# Patient Record
Sex: Male | Born: 1980
Health system: Southern US, Community
[De-identification: ages and names within clinical notes are randomized; demographics above are authoritative.]

## PROBLEM LIST (undated history)

## (undated) DIAGNOSIS — I10 Essential (primary) hypertension: Secondary | ICD-10-CM

## (undated) DIAGNOSIS — S069X9A Unspecified intracranial injury with loss of consciousness of unspecified duration, initial encounter: Secondary | ICD-10-CM

## (undated) DIAGNOSIS — Z91018 Allergy to other foods: Secondary | ICD-10-CM

## (undated) DIAGNOSIS — G43909 Migraine, unspecified, not intractable, without status migrainosus: Secondary | ICD-10-CM

## (undated) DIAGNOSIS — G44309 Post-traumatic headache, unspecified, not intractable: Secondary | ICD-10-CM

## (undated) DIAGNOSIS — B019 Varicella without complication: Secondary | ICD-10-CM

## (undated) DIAGNOSIS — G4730 Sleep apnea, unspecified: Secondary | ICD-10-CM

## (undated) DIAGNOSIS — G5600 Carpal tunnel syndrome, unspecified upper limb: Secondary | ICD-10-CM

## (undated) DIAGNOSIS — Z87442 Personal history of urinary calculi: Secondary | ICD-10-CM

## (undated) DIAGNOSIS — G4489 Other headache syndrome: Secondary | ICD-10-CM

## (undated) DIAGNOSIS — M109 Gout, unspecified: Secondary | ICD-10-CM

## (undated) DIAGNOSIS — M1990 Unspecified osteoarthritis, unspecified site: Secondary | ICD-10-CM

## (undated) DIAGNOSIS — S0990XS Unspecified injury of head, sequela: Secondary | ICD-10-CM

## (undated) HISTORY — DX: Gout, unspecified: M10.9

## (undated) HISTORY — DX: Post-traumatic headache, unspecified, not intractable: S09.90XS

## (undated) HISTORY — PX: CERVICAL FUSION: SHX112

## (undated) HISTORY — DX: Other headache syndrome: G44.89

## (undated) HISTORY — DX: Carpal tunnel syndrome, unspecified upper limb: G56.00

## (undated) HISTORY — DX: Varicella without complication: B01.9

## (undated) HISTORY — DX: Unspecified osteoarthritis, unspecified site: M19.90

## (undated) HISTORY — DX: Allergy to other foods: Z91.018

## (undated) HISTORY — DX: Post-traumatic headache, unspecified, not intractable: G44.309

---
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
## 2006-03-26 ENCOUNTER — Ambulatory Visit: Payer: Self-pay | Admitting: Internal Medicine

## 2006-04-04 ENCOUNTER — Encounter: Admission: RE | Admit: 2006-04-04 | Discharge: 2006-04-04 | Payer: Self-pay | Admitting: Internal Medicine

## 2009-02-11 DIAGNOSIS — S069XAA Unspecified intracranial injury with loss of consciousness status unknown, initial encounter: Secondary | ICD-10-CM

## 2009-02-11 DIAGNOSIS — S069X9A Unspecified intracranial injury with loss of consciousness of unspecified duration, initial encounter: Secondary | ICD-10-CM

## 2009-02-11 HISTORY — DX: Unspecified intracranial injury with loss of consciousness status unknown, initial encounter: S06.9XAA

## 2009-02-11 HISTORY — DX: Unspecified intracranial injury with loss of consciousness of unspecified duration, initial encounter: S06.9X9A

## 2009-12-19 ENCOUNTER — Observation Stay (HOSPITAL_COMMUNITY): Admission: RE | Admit: 2009-12-19 | Discharge: 2009-12-20 | Payer: Self-pay | Admitting: Orthopedic Surgery

## 2010-02-11 HISTORY — PX: FRACTURE SURGERY: SHX138

## 2010-02-11 HISTORY — PX: OTHER SURGICAL HISTORY: SHX169

## 2010-02-15 ENCOUNTER — Encounter
Admission: RE | Admit: 2010-02-15 | Discharge: 2010-03-13 | Payer: Self-pay | Source: Home / Self Care | Attending: Neurosurgery | Admitting: Neurosurgery

## 2010-03-14 ENCOUNTER — Ambulatory Visit: Payer: Worker's Compensation | Admitting: Rehabilitative and Restorative Service Providers"

## 2010-03-14 ENCOUNTER — Encounter: Payer: Self-pay | Admitting: Occupational Therapy

## 2010-03-14 ENCOUNTER — Encounter: Payer: Worker's Compensation | Admitting: Occupational Therapy

## 2010-03-14 ENCOUNTER — Ambulatory Visit: Payer: Self-pay | Admitting: Rehabilitative and Restorative Service Providers"

## 2010-03-14 DIAGNOSIS — IMO0001 Reserved for inherently not codable concepts without codable children: Secondary | ICD-10-CM | POA: Insufficient documentation

## 2010-03-14 DIAGNOSIS — M25619 Stiffness of unspecified shoulder, not elsewhere classified: Secondary | ICD-10-CM | POA: Insufficient documentation

## 2010-03-14 DIAGNOSIS — R4189 Other symptoms and signs involving cognitive functions and awareness: Secondary | ICD-10-CM | POA: Insufficient documentation

## 2010-03-19 ENCOUNTER — Ambulatory Visit: Payer: Worker's Compensation | Attending: Neurosurgery | Admitting: Occupational Therapy

## 2010-03-19 DIAGNOSIS — R4189 Other symptoms and signs involving cognitive functions and awareness: Secondary | ICD-10-CM | POA: Insufficient documentation

## 2010-03-19 DIAGNOSIS — IMO0001 Reserved for inherently not codable concepts without codable children: Secondary | ICD-10-CM | POA: Insufficient documentation

## 2010-03-19 DIAGNOSIS — M25619 Stiffness of unspecified shoulder, not elsewhere classified: Secondary | ICD-10-CM | POA: Insufficient documentation

## 2010-03-21 ENCOUNTER — Ambulatory Visit: Payer: Worker's Compensation | Admitting: Occupational Therapy

## 2010-03-26 ENCOUNTER — Ambulatory Visit: Payer: Worker's Compensation | Admitting: Occupational Therapy

## 2010-03-28 ENCOUNTER — Ambulatory Visit: Payer: Worker's Compensation | Admitting: Occupational Therapy

## 2010-04-02 ENCOUNTER — Ambulatory Visit: Payer: Worker's Compensation | Admitting: Occupational Therapy

## 2010-04-04 ENCOUNTER — Encounter: Payer: Worker's Compensation | Admitting: Occupational Therapy

## 2010-04-04 ENCOUNTER — Ambulatory Visit: Payer: Worker's Compensation | Admitting: Occupational Therapy

## 2010-04-09 ENCOUNTER — Ambulatory Visit: Payer: Worker's Compensation | Admitting: Occupational Therapy

## 2010-04-11 ENCOUNTER — Ambulatory Visit: Payer: Worker's Compensation | Admitting: Occupational Therapy

## 2010-04-17 ENCOUNTER — Encounter: Payer: Worker's Compensation | Admitting: Occupational Therapy

## 2010-04-17 ENCOUNTER — Ambulatory Visit: Payer: Worker's Compensation | Attending: Neurosurgery | Admitting: Occupational Therapy

## 2010-04-17 DIAGNOSIS — IMO0001 Reserved for inherently not codable concepts without codable children: Secondary | ICD-10-CM | POA: Insufficient documentation

## 2010-04-17 DIAGNOSIS — M25619 Stiffness of unspecified shoulder, not elsewhere classified: Secondary | ICD-10-CM | POA: Insufficient documentation

## 2010-04-17 DIAGNOSIS — R4189 Other symptoms and signs involving cognitive functions and awareness: Secondary | ICD-10-CM | POA: Insufficient documentation

## 2010-04-18 ENCOUNTER — Ambulatory Visit: Payer: Worker's Compensation | Admitting: Occupational Therapy

## 2010-04-19 ENCOUNTER — Encounter: Payer: Worker's Compensation | Admitting: Occupational Therapy

## 2010-04-24 LAB — BASIC METABOLIC PANEL
Chloride: 104 mEq/L (ref 96–112)
Creatinine, Ser: 0.94 mg/dL (ref 0.4–1.5)
GFR calc Af Amer: >60 mL/min (ref >60)
GFR calc non Af Amer: >60 mL/min (ref >60)
Potassium: 3.9 mEq/L (ref 3.5–5.1)

## 2010-04-24 LAB — CBC
MCV: 91.3 fL (ref 78.0–100.0)
Platelets: 225 10*3/uL (ref 150–400)
RBC: 5.31 MIL/uL (ref 4.22–5.81)
WBC: 7.1 10*3/uL (ref 4.0–10.5)

## 2010-04-24 LAB — SURGICAL PCR SCREEN
MRSA, PCR: NEGATIVE
Staphylococcus aureus: POSITIVE — AB

## 2010-04-24 LAB — DIFFERENTIAL
Eosinophils Relative: 2 % (ref 0–5)
Lymphocytes Relative: 24 % (ref 12–46)
Lymphs Abs: 1.7 10*3/uL (ref 0.7–4.0)
Neutro Abs: 4.8 10*3/uL (ref 1.7–7.7)

## 2010-04-24 LAB — URINALYSIS, ROUTINE W REFLEX MICROSCOPIC
Bilirubin Urine: NEGATIVE
Hgb urine dipstick: NEGATIVE
Ketones, ur: NEGATIVE mg/dL
Nitrite: NEGATIVE
Protein, ur: NEGATIVE mg/dL
Specific Gravity, Urine: 1.026 (ref 1.005–1.030)
Urobilinogen, UA: 0.2 mg/dL (ref 0.0–1.0)

## 2010-04-24 LAB — APTT: aPTT: 28 seconds (ref 24–37)

## 2010-04-24 LAB — PROTIME-INR
INR: 0.89 (ref 0.00–1.49)
Prothrombin Time: 12.2 seconds (ref 11.6–15.2)

## 2010-04-26 DIAGNOSIS — F0781 Postconcussional syndrome: Secondary | ICD-10-CM

## 2010-05-03 ENCOUNTER — Emergency Department (INDEPENDENT_AMBULATORY_CARE_PROVIDER_SITE_OTHER): Payer: Worker's Compensation

## 2010-05-03 ENCOUNTER — Emergency Department (HOSPITAL_BASED_OUTPATIENT_CLINIC_OR_DEPARTMENT_OTHER)
Admission: EM | Admit: 2010-05-03 | Discharge: 2010-05-03 | Disposition: A | Discharge status: Home / Self Care | Payer: Worker's Compensation | Attending: Emergency Medicine | Admitting: Emergency Medicine

## 2010-05-03 DIAGNOSIS — N201 Calculus of ureter: Secondary | ICD-10-CM

## 2010-05-03 DIAGNOSIS — N1330 Unspecified hydronephrosis: Secondary | ICD-10-CM | POA: Insufficient documentation

## 2010-05-03 DIAGNOSIS — N200 Calculus of kidney: Secondary | ICD-10-CM

## 2010-05-03 DIAGNOSIS — R1032 Left lower quadrant pain: Secondary | ICD-10-CM | POA: Insufficient documentation

## 2010-05-09 DIAGNOSIS — F0781 Postconcussional syndrome: Secondary | ICD-10-CM

## 2010-05-28 ENCOUNTER — Ambulatory Visit (HOSPITAL_COMMUNITY)
Admission: RE | Admit: 2010-05-28 | Discharge: 2010-05-28 | Disposition: A | Discharge status: Home / Self Care | Payer: 59 | Source: Ambulatory Visit | Attending: Urology | Admitting: Urology

## 2010-05-28 DIAGNOSIS — N201 Calculus of ureter: Secondary | ICD-10-CM | POA: Insufficient documentation

## 2010-05-28 DIAGNOSIS — I10 Essential (primary) hypertension: Secondary | ICD-10-CM | POA: Insufficient documentation

## 2011-02-19 ENCOUNTER — Other Ambulatory Visit (HOSPITAL_COMMUNITY): Payer: Self-pay

## 2011-03-05 DIAGNOSIS — F0781 Postconcussional syndrome: Secondary | ICD-10-CM

## 2011-03-11 DIAGNOSIS — F0781 Postconcussional syndrome: Secondary | ICD-10-CM

## 2012-01-02 ENCOUNTER — Other Ambulatory Visit (HOSPITAL_COMMUNITY): Payer: Self-pay | Admitting: *Deleted

## 2012-01-03 ENCOUNTER — Other Ambulatory Visit (HOSPITAL_COMMUNITY): Payer: Self-pay

## 2012-01-03 ENCOUNTER — Other Ambulatory Visit (HOSPITAL_COMMUNITY): Payer: Self-pay | Admitting: *Deleted

## 2012-01-03 DIAGNOSIS — Z139 Encounter for screening, unspecified: Secondary | ICD-10-CM

## 2012-01-15 ENCOUNTER — Ambulatory Visit (HOSPITAL_COMMUNITY)
Admission: RE | Admit: 2012-01-15 | Discharge: 2012-01-15 | Disposition: A | Discharge status: Home / Self Care | Payer: Worker's Compensation | Source: Ambulatory Visit | Attending: Neurology | Admitting: Neurology

## 2012-01-15 DIAGNOSIS — Z139 Encounter for screening, unspecified: Secondary | ICD-10-CM | POA: Insufficient documentation

## 2012-02-19 ENCOUNTER — Inpatient Hospital Stay (HOSPITAL_COMMUNITY): Admission: RE | Admit: 2012-02-19 | Payer: Self-pay | Source: Ambulatory Visit

## 2012-04-12 ENCOUNTER — Emergency Department (HOSPITAL_BASED_OUTPATIENT_CLINIC_OR_DEPARTMENT_OTHER)
Admission: EM | Admit: 2012-04-12 | Discharge: 2012-04-12 | Disposition: A | Discharge status: Home / Self Care | Payer: 59 | Attending: Emergency Medicine | Admitting: Emergency Medicine

## 2012-04-12 ENCOUNTER — Encounter (HOSPITAL_BASED_OUTPATIENT_CLINIC_OR_DEPARTMENT_OTHER): Payer: Self-pay | Admitting: *Deleted

## 2012-04-12 DIAGNOSIS — H5710 Ocular pain, unspecified eye: Secondary | ICD-10-CM | POA: Insufficient documentation

## 2012-04-12 DIAGNOSIS — Z79899 Other long term (current) drug therapy: Secondary | ICD-10-CM | POA: Insufficient documentation

## 2012-04-12 DIAGNOSIS — Z87828 Personal history of other (healed) physical injury and trauma: Secondary | ICD-10-CM | POA: Insufficient documentation

## 2012-04-12 DIAGNOSIS — Z87820 Personal history of traumatic brain injury: Secondary | ICD-10-CM | POA: Insufficient documentation

## 2012-04-12 DIAGNOSIS — G43909 Migraine, unspecified, not intractable, without status migrainosus: Secondary | ICD-10-CM | POA: Insufficient documentation

## 2012-04-12 DIAGNOSIS — H5711 Ocular pain, right eye: Secondary | ICD-10-CM

## 2012-04-12 HISTORY — DX: Unspecified intracranial injury with loss of consciousness of unspecified duration, initial encounter: S06.9X9A

## 2012-04-12 HISTORY — DX: Migraine, unspecified, not intractable, without status migrainosus: G43.909

## 2012-04-12 MED ORDER — FLUORESCEIN SODIUM 1 MG OP STRP
1.0000 | ORAL_STRIP | Freq: Once | OPHTHALMIC | Status: AC
  Administered 2012-04-12: 1 via OPHTHALMIC
  Filled 2012-04-12: qty 1

## 2012-04-12 MED ORDER — TETRACAINE HCL 0.5 % OP SOLN
2.0000 [drp] | Freq: Once | OPHTHALMIC | Status: AC
  Administered 2012-04-12: 2 [drp] via OPHTHALMIC
  Filled 2012-04-12: qty 2

## 2012-04-12 NOTE — ED Notes (Signed)
Visual acuity completed with corrective lens in use.
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
## 2012-04-12 NOTE — ED Notes (Signed)
MD at bedside. 
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
## 2012-07-18 DIAGNOSIS — M25579 Pain in unspecified ankle and joints of unspecified foot: Secondary | ICD-10-CM | POA: Insufficient documentation

## 2012-07-18 DIAGNOSIS — I959 Hypotension, unspecified: Secondary | ICD-10-CM | POA: Insufficient documentation

## 2012-07-18 DIAGNOSIS — S069X9A Unspecified intracranial injury with loss of consciousness of unspecified duration, initial encounter: Secondary | ICD-10-CM | POA: Insufficient documentation

## 2012-07-18 DIAGNOSIS — M79609 Pain in unspecified limb: Secondary | ICD-10-CM | POA: Insufficient documentation

## 2013-04-26 DIAGNOSIS — L089 Local infection of the skin and subcutaneous tissue, unspecified: Secondary | ICD-10-CM | POA: Insufficient documentation

## 2014-01-12 ENCOUNTER — Ambulatory Visit (INDEPENDENT_AMBULATORY_CARE_PROVIDER_SITE_OTHER): Payer: 59 | Admitting: Family

## 2014-01-12 ENCOUNTER — Encounter: Payer: Self-pay | Admitting: Family

## 2014-01-12 ENCOUNTER — Other Ambulatory Visit (INDEPENDENT_AMBULATORY_CARE_PROVIDER_SITE_OTHER): Payer: 59

## 2014-01-12 VITALS — BP 138/88 | HR 81 | Temp 98.4°F | Resp 18 | Ht 72.0 in | Wt 243.0 lb

## 2014-01-12 DIAGNOSIS — R198 Other specified symptoms and signs involving the digestive system and abdomen: Secondary | ICD-10-CM

## 2014-01-12 DIAGNOSIS — R194 Change in bowel habit: Secondary | ICD-10-CM

## 2014-01-12 DIAGNOSIS — R0789 Other chest pain: Secondary | ICD-10-CM | POA: Insufficient documentation

## 2014-01-12 DIAGNOSIS — R5383 Other fatigue: Secondary | ICD-10-CM | POA: Insufficient documentation

## 2014-01-12 LAB — CBC
HEMATOCRIT: 47.9 % (ref 39.0–52.0)
HEMOGLOBIN: 16.2 g/dL (ref 13.0–17.0)
MCHC: 33.8 g/dL (ref 30.0–36.0)
MCV: 92.9 fl (ref 78.0–100.0)
PLATELETS: 275 10*3/uL (ref 150.0–400.0)
RBC: 5.16 Mil/uL (ref 4.22–5.81)
RDW: 12.1 % (ref 11.5–15.5)
WBC: 9.3 10*3/uL (ref 4.0–10.5)

## 2014-01-12 LAB — LIPID PANEL
CHOLESTEROL: 197 mg/dL (ref 0–200)
HDL: 30.3 mg/dL — ABNORMAL LOW (ref >39.00)
LDL CALC: 134 mg/dL — AB (ref 0–99)
NonHDL: 166.7
Total CHOL/HDL Ratio: 7
Triglycerides: 164 mg/dL — ABNORMAL HIGH (ref 0.0–149.0)
VLDL: 32.8 mg/dL (ref 0.0–40.0)

## 2014-01-12 LAB — BASIC METABOLIC PANEL
BUN: 18 mg/dL (ref 6–23)
CHLORIDE: 103 meq/L (ref 96–112)
CO2: 23 mEq/L (ref 19–32)
CREATININE: 1 mg/dL (ref 0.4–1.5)
Calcium: 9.6 mg/dL (ref 8.4–10.5)
GFR: 90.13 mL/min (ref >60.00)
Glucose, Bld: 78 mg/dL (ref 70–99)
POTASSIUM: 4 meq/L (ref 3.5–5.1)
SODIUM: 137 meq/L (ref 135–145)

## 2014-01-12 MED ORDER — ALPRAZOLAM 0.25 MG PO TABS: 0.2500 mg | ORAL_TABLET | Freq: Two times a day (BID) | ORAL | Status: DC | PRN

## 2014-01-12 NOTE — Progress Notes (Signed)
Pre visit review using our clinic review tool, if applicable. No additional management support is needed unless otherwise documented below in the visit note. 

## 2014-01-12 NOTE — Assessment & Plan Note (Signed)
Symptoms and exam are benign. Start probiotic to determine if this will improve the problem. Follow up if symptoms worsen or fail to improve

## 2014-01-12 NOTE — Assessment & Plan Note (Signed)
Chest tightness is most likely related to anxiety. Obtain EKG. Pending results of EKG may refer to cardiology. Patient requesting medication to assist with anxiety. Discussed risks and benefits of medication. Patient would like as needed medication at this time. Start Xanax. Follow up if symptoms worsen or fail to improve.  EKG reveals normal sinus rhythm.

## 2014-01-12 NOTE — Progress Notes (Signed)
   Subjective:    Patient ID: Wayne Wise, male    DOB: 22-Mar-1980, 33 y.o.   MRN: [id number]  Chief Complaint  Patient presents with  . Establish Care    chest tightness, frequent BMs, no energy    HPI:  Wayne Wise is a 33 y.o. male who presents today to establish care and discuss     1) Chest tightness - Other night he could not sleep because he felt a squeezing on his chest, states "it wasn't sitting on my chest."  Denies any associated jaw or shoulder pain. Has occurred twice total. Got better on its own with no treatment. Potentially stress is an aggravating factor. Denies any chest pain/discomfort, shortness of breath, palpitations.   2) Decreased energy - has been tired a lot. Has been going on since about June maybe before. Around the the time that he has a child. 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 Currently averaging around 7 hours per sleep. Indicates he feels rested when he awakens. Started snoring after TBI.  3) Frequent bowel movements - describes every time after he eats. Normally was once a day. Eats everything with no changes in diet.   Allergies  Allergen Reactions  . Sulfa Antibiotics Swelling   Current Outpatient Prescriptions on File Prior to Visit  Medication Sig Dispense Refill  . HYDROmorphone (DILAUDID) 4 MG tablet Take 4 mg by mouth every 4 (four) hours as needed for pain.     No current facility-administered medications on file prior to visit.   Past Medical History  Diagnosis Date  . TBI (traumatic brain injury)   . Migraines   . Chicken pox   . 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 Kidney stones   . Arthritis     Review of Systems    See HPI  Objective:    BP 138/88 mmHg  Pulse 81  Temp(Src) 98.4 F (36.9 C) (Oral)  Resp 18  Ht 6' (1.829 m)  Wt 243 lb (110.224 kg)  BMI 32.95 kg/m2  SpO2 97%  Nursing note and vital signs reviewed.  Physical Exam  Constitutional: He is oriented to person, place, and time. He appears  well-developed and well-nourished. No distress.  Cardiovascular: Normal rate, regular rhythm, normal heart sounds and intact distal pulses.   Pulmonary/Chest: Effort normal and breath sounds normal.  Abdominal: Soft. Bowel sounds are normal. He exhibits no distension and no mass. There is no tenderness. There is no rebound and no guarding.  Neurological: He is alert and oriented to person, place, and time.  Skin: Skin is warm and dry.  Psychiatric: He has a normal mood and affect. His behavior is normal. Judgment and thought content normal.       Assessment & Plan:

## 2014-01-12 NOTE — Assessment & Plan Note (Signed)
Obtain TSH, basic metabolic panel, CBC, and lipid profile 2 rule out any metabolic causes of fatigue. Potentially related to working night shift. May also be related to stress patient is experiencing with family issues. Follow up pending lab work.

## 2014-01-12 NOTE — Patient Instructions (Signed)
Thank you for choosing [facility].  Summary/Instructions:  Your prescription(s) have been submitted to your pharmacy. Please take as directed and contact our office if you believe you are having problem(s) with the medication(s).  Please stop by the lab on the basement level of the building for your blood work. Your results will be released to [facility] (or called to you) after review, usually within 72hours after test completion. If any changes need to be made, you will be notified at that same time.  If your symptoms worsen or fail to improve, please contact our office for further instruction, or in case of emergency go directly to the emergency room at the closest medical facility.   Please schedule a time for your physical.   Generalized Anxiety Disorder Generalized anxiety disorder (GAD) is a mental disorder. It interferes with life functions, including relationships, work, and school. GAD is different from normal anxiety, which everyone experiences at some point in their lives in response to specific life events and activities. Normal anxiety actually helps [place] prepare for and get through these life events and activities. Normal anxiety goes away after the event or activity is over.  GAD causes anxiety that is not necessarily related to specific events or activities. It also causes excess anxiety in proportion to specific events or activities. The anxiety associated with GAD is also difficult to control. GAD can vary from mild to severe. People with severe GAD can have intense waves of anxiety with physical symptoms (panic attacks).  SYMPTOMS The anxiety and worry associated with GAD are difficult to control. This anxiety and worry are related to many life events and activities and also occur more days than not for 6 months or longer. People with GAD also have three or more of the following symptoms (one or more in children):  Restlessness.   Fatigue.  Difficulty concentrating.    Irritability.  Muscle tension.  Difficulty sleeping or unsatisfying sleep. DIAGNOSIS GAD is diagnosed through an assessment by your health care provider. Your health care provider will ask you questions aboutyour mood,physical symptoms, and events in your life. Your health care provider may ask you about your medical history and use of alcohol or drugs, including prescription medicines. Your health care provider may also do a physical exam and blood tests. Certain medical conditions and the use of certain substances can cause symptoms similar to those associated with GAD. Your health care provider may refer you to a mental health specialist for further evaluation. TREATMENT The following therapies are usually used to treat GAD:   Medication. Antidepressant medication usually is prescribed for long-term daily control. Antianxiety medicines may be added in severe cases, especially when panic attacks occur.   Talk therapy (psychotherapy). Certain types of talk therapy can be helpful in treating GAD by providing support, education, and guidance. A form of talk therapy called cognitive behavioral therapy can teach you healthy ways to think about and react to daily life events and activities.  Stress managementtechniques. These include yoga, meditation, and exercise and can be very helpful when they are practiced regularly. A mental health specialist can help determine which treatment is best for you. Some people see improvement with one therapy. However, other people require a combination of therapies. Document Released: 05/25/2012 Document Revised: 06/14/2013 Document Reviewed: 05/25/2012 [facility] Patient Information 2015 [place], [place]. This information is not intended to replace advice given to you by your health care provider. Make sure you discuss any questions you have with your health care provider.  Fatigue Fatigue is a feeling of tiredness, lack of energy, lack of motivation, or  feeling tired all the time. Having enough rest, good nutrition, and reducing stress will normally reduce fatigue. Consult your caregiver if it persists. The nature of your fatigue will help your caregiver to find out its cause. The treatment is based on the cause.  CAUSES  There are many causes for fatigue. Most of the time, fatigue can be traced to one or more of your habits or routines. Most causes fit into one or more of three general areas. They are: Lifestyle problems  Sleep disturbances.  Overwork.  Physical exertion.  Unhealthy habits.  Poor eating habits or eating disorders.  Alcohol and/or drug use .  Lack of proper nutrition (malnutrition). Psychological problems  Stress and/or anxiety problems.  Depression.  Grief.  Boredom. Medical Problems or Conditions  Anemia.  Pregnancy.  Thyroid gland problems.  Recovery from major surgery.  Continuous pain.  Emphysema or asthma that is not well controlled  Allergic conditions.  Diabetes.  Infections (such as mononucleosis).  Obesity.  Sleep disorders, such as sleep apnea.  Heart failure or other heart-related problems.  Cancer.  Kidney disease.  Liver disease.  Effects of certain medicines such as antihistamines, cough and cold remedies, prescription pain medicines, heart and blood pressure medicines, drugs used for treatment of cancer, and some antidepressants. SYMPTOMS  The symptoms of fatigue include:   Lack of energy.  Lack of drive (motivation).  Drowsiness.  Feeling of indifference to the surroundings. DIAGNOSIS  The details of how you feel help guide your caregiver in finding out what is causing the fatigue. You will be asked about your present and past health condition. It is important to review all medicines that you take, including prescription and non-prescription items. A thorough exam will be done. You will be questioned about your feelings, habits, and normal lifestyle. Your  caregiver may suggest blood tests, urine tests, or other tests to look for common medical causes of fatigue.  TREATMENT  Fatigue is treated by correcting the underlying cause. For example, if you have continuous pain or depression, treating these causes will improve how you feel. Similarly, adjusting the dose of certain medicines will help in reducing fatigue.  HOME CARE INSTRUCTIONS   Try to get the required amount of good sleep every night.  Eat a healthy and nutritious diet, and drink enough water throughout the day.  Practice ways of relaxing (including yoga or meditation).  Exercise regularly.  Make plans to change situations that cause stress. Act on those plans so that stresses decrease over time. Keep your work and personal routine reasonable.  Avoid street drugs and minimize use of alcohol.  Start taking a daily multivitamin after consulting your caregiver. SEEK MEDICAL CARE IF:   You have persistent tiredness, which cannot be accounted for.  You have fever.  You have unintentional weight loss.  You have headaches.  You have disturbed sleep throughout the night.  You are feeling sad.  You have constipation.  You have dry skin.  You have gained weight.  You are taking any new or different medicines that you suspect are causing fatigue.  You are unable to sleep at night.  You develop any unusual swelling of your legs or other parts of your body. SEEK IMMEDIATE MEDICAL CARE IF:   You are feeling confused.  Your vision is blurred.  You feel faint or pass out.  You develop severe headache.  You develop severe abdominal, pelvic,  or back pain.  You develop chest pain, shortness of breath, or an irregular or fast heartbeat.  You are unable to pass a normal amount of urine.  You develop abnormal bleeding such as bleeding from the rectum or you vomit blood.  You have thoughts about harming yourself or committing suicide.  You are worried that you might  harm someone else. MAKE SURE YOU:   Understand these instructions.  Will watch your condition.  Will get help right away if you are not doing well or get worse. Document Released: 11/25/2006 Document Revised: 04/22/2011 Document Reviewed: 06/01/2013 [facility] Patient Information 2015 [place], [place]. This information is not intended to replace advice given to you by your health care provider. Make sure you discuss any questions you have with your health care provider.

## 2014-01-13 ENCOUNTER — Telehealth: Payer: Self-pay | Admitting: Family

## 2014-01-13 LAB — TSH: TSH: 1.93 u[IU]/mL (ref 0.35–4.50)

## 2014-01-13 NOTE — Telephone Encounter (Signed)
Called pt and let him know all labs are normal with the exception of his cholesterol being a little elevated. He is aware to try to eat more fruits and veggies and decrease saturated fats.

## 2014-01-13 NOTE — Telephone Encounter (Signed)
Please call patient and tell him that his lab work is within the normal limits. His cholesterol is at the high end of the normal range. Recommend increasing fruits and vegetables and decreasing saturated fats. This should help to decrease his overall cholesterol levels. Otherwise no action is needed at this time.

## 2014-01-17 ENCOUNTER — Telehealth: Payer: Self-pay | Admitting: Family

## 2014-01-17 MED ORDER — DIAZEPAM 5 MG PO TABS: 5.0000 mg | ORAL_TABLET | Freq: Two times a day (BID) | ORAL | Status: DC | PRN

## 2014-01-17 NOTE — Telephone Encounter (Signed)
Called pt to let him know that rx for valium is ready for pick up

## 2014-01-17 NOTE — Telephone Encounter (Signed)
Please call patient and inform him we can start valium as needed for anxiety. If this does not work we will need to consider something taken on a daily basis.

## 2014-01-17 NOTE — Telephone Encounter (Signed)
Patient states xanax is not working for him.  Patient states he was suppose to take one pill every 12 hours and that didn't work.  He started taking two pills every 12 hours, that didn't work.  He has started taking 3 pills every 12 hours and states that is not working.

## 2014-01-25 ENCOUNTER — Ambulatory Visit (INDEPENDENT_AMBULATORY_CARE_PROVIDER_SITE_OTHER): Payer: 59 | Admitting: Family

## 2014-01-25 VITALS — BP 150/82 | HR 97 | Temp 98.2°F | Resp 18 | Ht 72.0 in | Wt 244.4 lb

## 2014-01-25 DIAGNOSIS — Z0000 Encounter for general adult medical examination without abnormal findings: Secondary | ICD-10-CM

## 2014-01-25 MED ORDER — CLONAZEPAM 1 MG PO TABS: 1.0000 mg | ORAL_TABLET | Freq: Two times a day (BID) | ORAL | Status: DC | PRN

## 2014-01-25 NOTE — Progress Notes (Signed)
Subjective:    Patient ID: Wayne Wise, male    DOB: [birth year], 33 y.o.   MRN: [id number]  Chief Complaint  Patient presents with  . CPE    Not fasting   HPI:  Wayne Wise is a 33 y.o. male who presents today for an annual wellness visit.  1) Health Maintenance - Rates the overall health as decent. Feels tired all the time and has always felt tired all the time.   Diet -  Anything and everything.  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   2) Preventative Exams / Immunizations:  Dental -- Up to date Vision -- Up to date  Health Maintenance  Topic Date Due  . INFLUENZA VACCINE  09/12/2014  . TETANUS/TDAP  11/18/2023    There is no immunization history on file for this patient.  Allergies  Allergen Reactions  . Sulfa Antibiotics Swelling   Current Outpatient Prescriptions on File Prior to Visit  Medication Sig Dispense Refill  . HYDROmorphone (DILAUDID) 4 MG tablet Take 4 mg by mouth every 4 (four) hours as needed for pain.     No current facility-administered medications on file prior to visit.   Past Medical History  Diagnosis Date  . TBI (traumatic brain injury)   . Migraines   . Chicken pox   . 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 Kidney stones   . Arthritis    Family History  Problem Relation Age of Onset  . Arthritis Mother   . Arthritis Maternal Grandmother   . Breast cancer Maternal Grandmother   . Diabetes Maternal Grandmother   . Stroke Maternal Grandfather   . Diabetes Maternal Grandfather   . Breast cancer Paternal Grandmother    History   Social History  . Marital Status: Married    Spouse Name: N/A    Number of Children: 1  . Years of Education: 12   Occupational History  . [employer]    Social History Main Topics  . Smoking status: Never Smoker   . Smokeless tobacco: Former Systems developer  . Alcohol Use: Yes     Comment: occasionally  . Drug Use: No  . Sexual Activity: Not on file   Other  Topics Concern  . Not on file   Social History Narrative   Born and raised in [place], [place]. Currently resides in a private residence wife and son. 1 cat (farm outside). Fun: Likes to hunt.    Denies religious beliefs that effect healthcare.     Review of Systems  Constitutional: Denies fever, chills, fatigue, or significant weight gain/loss. HENT: Head: Denies headache or neck pain Ears: Denies changes in hearing, ringing in ears, earache, drainage Nose: Denies discharge, stuffiness, itching, nosebleed, sinus pain Throat: Denies sore throat, hoarseness, dry mouth, sores, thrush Eyes: Denies loss/changes in vision, pain, redness, blurry/double vision, flashing lights Cardiovascular: Denies chest pain/discomfort, tightness, palpitations, shortness of breath with activity, difficulty lying down, swelling, sudden awakening with shortness of breath Respiratory: Denies shortness of breath, cough, sputum production, wheezing Gastrointestinal: Denies dysphasia, heartburn, change in appetite, nausea, change in bowel habits, rectal bleeding, constipation, diarrhea, yellow skin or eyes Genitourinary: Denies frequency, urgency, burning/pain, blood in urine, incontinence, change in urinary strength. Musculoskeletal: Denies muscle/joint pain, stiffness, back pain, redness or swelling of joints, trauma Skin: Denies rashes, lumps, itching, dryness, color changes, or hair/nail changes Neurological: Denies dizziness, fainting, seizures, weakness, numbness, tingling, tremor Psychiatric - Denies nervousness, stress,  depression or memory loss Does have anxiety on occasion.  Endocrine: Denies heat or cold intolerance, sweating, frequent urination, excessive thirst, changes in appetite Hematologic: Denies ease of bruising or bleeding    Objective:    BP 150/82 mmHg  Pulse 97  Temp(Src) 98.2 F (36.8 C) (Oral)  Resp 18  Ht 6' (1.829 m)  Wt 244 lb 6.4 oz (110.859 kg)  BMI 33.14 kg/m2  SpO2  97% Nursing note and vital signs reviewed.  Physical Exam  Constitutional: He is oriented to person, place, and time. He appears well-developed and well-nourished.  HENT:  Head: Normocephalic.  Right Ear: Hearing, tympanic membrane, external ear and ear canal normal.  Left Ear: Hearing, tympanic membrane, external ear and ear canal normal.  Nose: Nose normal.  Mouth/Throat: Uvula is midline, oropharynx is clear and moist and mucous membranes are normal.  Eyes: Conjunctivae and EOM are normal. Pupils are equal, round, and reactive to light.  Neck: Neck supple. No JVD present. No tracheal deviation present. No thyromegaly present.  Cardiovascular: Normal rate, regular rhythm, normal heart sounds and intact distal pulses.   Pulmonary/Chest: Effort normal and breath sounds normal.  Abdominal: Soft. Bowel sounds are normal. He exhibits no distension and no mass. There is no tenderness. There is no rebound and no guarding.  Musculoskeletal: Normal range of motion. He exhibits no edema or tenderness.  Lymphadenopathy:    He has no cervical adenopathy.  Neurological: He is alert and oriented to person, place, and time. He has normal reflexes. No cranial nerve deficit. He exhibits normal muscle tone. Coordination normal.  Skin: Skin is warm and dry.  Psychiatric: He has a normal mood and affect. His behavior is normal. Judgment and thought content normal.      Assessment & Plan:

## 2014-01-25 NOTE — Progress Notes (Signed)
Pre visit review using our clinic review tool, if applicable. No additional management support is needed unless otherwise documented below in the visit note. 

## 2014-01-25 NOTE — Patient Instructions (Signed)
Thank you for choosing [facility].  Summary/Instructions:  Your prescription(s) have been submitted to your pharmacy. Please take as directed and contact our office if you believe you are having problem(s) with the medication(s).  Health Maintenance A healthy lifestyle and preventative care can promote health and wellness.  Maintain regular health, dental, and eye exams.  Eat a healthy diet. Foods like vegetables, fruits, whole grains, low-fat dairy products, and lean protein foods contain the nutrients you need and are low in calories. Decrease your intake of foods high in solid fats, added sugars, and salt. Get information about a proper diet from your health care provider, if necessary.  Regular physical exercise is one of the most important things you can do for your health. Most adults should get at least 150 minutes of moderate-intensity exercise (any activity that increases your heart rate and causes you to sweat) each week. In addition, most adults need muscle-strengthening exercises on 2 or more days a week.   Maintain a healthy weight. The body mass index (BMI) is a screening tool to identify possible weight problems. It provides an estimate of body fat based on height and weight. Your health care provider can find your BMI and can help you achieve or maintain a healthy weight. For males 20 years and older:  A BMI below 18.5 is considered underweight.  A BMI of 18.5 to 24.9 is normal.  A BMI of 25 to 29.9 is considered overweight.  A BMI of 30 and above is considered obese.  Maintain normal blood lipids and cholesterol by exercising and minimizing your intake of saturated fat. Eat a balanced diet with plenty of fruits and vegetables. Blood tests for lipids and cholesterol should begin at age 40 and be repeated every 5 years. If your lipid or cholesterol levels are high, you are over age 93, or you are at high risk for heart disease, you may need your cholesterol levels  checked more frequently.Ongoing high lipid and cholesterol levels should be treated with medicines if diet and exercise are not working.  If you smoke, find out from your health care provider how to quit. If you do not use tobacco, do not start.  Lung cancer screening is recommended for adults aged 46-80 years who are at high risk for developing lung cancer because of a history of smoking. A yearly low-dose CT scan of the lungs is recommended for people who have at least a 30-pack-year history of smoking and are current smokers or have quit within the past 15 years. A pack year of smoking is smoking an average of 1 pack of cigarettes a day for 1 year (for example, a 30-pack-year history of smoking could mean smoking 1 pack a day for 30 years or 2 packs a day for 15 years). Yearly screening should continue until the smoker has stopped smoking for at least 15 years. Yearly screening should be stopped for people who develop a health problem that would prevent them from having lung cancer treatment.  If you choose to drink alcohol, do not have more than 2 drinks per day. One drink is considered to be 12 oz (360 mL) of beer, 5 oz (150 mL) of wine, or 1.5 oz (45 mL) of liquor.  Avoid the use of street drugs. Do not share needles with anyone. Ask for help if you need support or instructions about stopping the use of drugs.  High blood pressure causes heart disease and increases the risk of stroke. Blood pressure should be  checked at least every 1-2 years. Ongoing high blood pressure should be treated with medicines if weight loss and exercise are not effective.  If you are 57-8 years old, ask your health care provider if you should take aspirin to prevent heart disease.  Diabetes screening involves taking a blood sample to check your fasting blood sugar level. This should be done once every 3 years after age 24 if you are at a normal weight and without risk factors for diabetes. Testing should be considered  at a younger age or be carried out more frequently if you are overweight and have at least 1 risk factor for diabetes.  Colorectal cancer can be detected and often prevented. Most routine colorectal cancer screening begins at the age of 18 and continues through age 66. However, your health care provider may recommend screening at an earlier age if you have risk factors for colon cancer. On a yearly basis, your health care provider may provide home test kits to check for hidden blood in the stool. A small camera at the end of a tube may be used to directly examine the colon (sigmoidoscopy or colonoscopy) to detect the earliest forms of colorectal cancer. Talk to your health care provider about this at age 74 when routine screening begins. A direct exam of the colon should be repeated every 5-10 years through age 17, unless early forms of precancerous polyps or small growths are found.  People who are at an increased risk for hepatitis B should be screened for this virus. You are considered at high risk for hepatitis B if:  You were born in a country where hepatitis B occurs often. Talk with your health care provider about which countries are considered high risk.  Your parents were born in a high-risk country and you have not received a shot to protect against hepatitis B (hepatitis B vaccine).  You have HIV or AIDS.  You use needles to inject street drugs.  You live with, or have sex with, someone who has hepatitis B.  You are a man who has sex with other men (MSM).  You get hemodialysis treatment.  You take certain medicines for conditions like cancer, organ transplantation, and autoimmune conditions.  Hepatitis C blood testing is recommended for all people born from [birth year] through 1967 and any individual with known risk factors for hepatitis C.  Healthy men should no longer receive prostate-specific antigen (PSA) blood tests as part of routine cancer screening. Talk to your health care  provider about prostate cancer screening.  Testicular cancer screening is not recommended for adolescents or adult males who have no symptoms. Screening includes self-exam, a health care provider exam, and other screening tests. Consult with your health care provider about any symptoms you have or any concerns you have about testicular cancer.  Practice safe sex. Use condoms and avoid high-risk sexual practices to reduce the spread of sexually transmitted infections (STIs).  You should be screened for STIs, including gonorrhea and chlamydia if:  You are sexually active and are younger than 24 years.  You are older than 24 years, and your health care provider tells you that you are at risk for this type of infection.  Your sexual activity has changed since you were last screened, and you are at an increased risk for chlamydia or gonorrhea. Ask your health care provider if you are at risk.  If you are at risk of being infected with HIV, it is recommended that you take a prescription medicine  daily to prevent HIV infection. This is called pre-exposure prophylaxis (PrEP). You are considered at risk if:  You are a man who has sex with other men (MSM).  You are a heterosexual man who is sexually active with multiple partners.  You take drugs by injection.  You are sexually active with a partner who has HIV.  Talk with your health care provider about whether you are at high risk of being infected with HIV. If you choose to begin PrEP, you should first be tested for HIV. You should then be tested every 3 months for as long as you are taking PrEP.  Use sunscreen. Apply sunscreen liberally and repeatedly throughout the day. You should seek shade when your shadow is shorter than you. Protect yourself by wearing long sleeves, pants, a wide-brimmed hat, and sunglasses year round whenever you are outdoors.  Tell your health care provider of new moles or changes in moles, especially if there is a change  in shape or color. Also, tell your health care provider if a mole is larger than the size of a pencil eraser.  A one-time screening for abdominal aortic aneurysm (AAA) and surgical repair of large AAAs by ultrasound is recommended for men aged 79-75 years who are current or former smokers.  Stay current with your vaccines (immunizations). Document Released: 07/27/2007 Document Revised: 02/02/2013 Document Reviewed: 06/25/2010 [facility] Patient Information 2015 [place], [place]. This information is not intended to replace advice given to you by your health care provider. Make sure you discuss any questions you have with your health care provider.

## 2014-01-25 NOTE — Assessment & Plan Note (Signed)
1) Anticipatory Guidance: Discussed importance of wearing a seatbelt while driving and not texting while driving; changing batteries in smoke detector at least once annually; wearing suntan lotion when outside; eating a balanced and moderate diet; getting physical activity at least 30 minutes per day.  2) Immunizations / Screenings / Labs:  All recommended immunizations are up to date. All recommended screenings are up-to-date. Lab work was previously done and all within the normal limits.   Overall well exam. Continue current healthy lifestyle choices. Still continues to have family stress related to his wife's illness and maintaining family life. Start Klonopin as needed for anxiety. Follow up if symptoms worsen or fail to improve with Klonopin. Follow up prevention exam in one year.

## 2014-04-18 ENCOUNTER — Encounter: Payer: Self-pay | Admitting: Family

## 2014-04-18 ENCOUNTER — Ambulatory Visit (INDEPENDENT_AMBULATORY_CARE_PROVIDER_SITE_OTHER): Payer: 59 | Admitting: Family

## 2014-04-18 VITALS — BP 162/98 | HR 107 | Temp 98.3°F | Resp 18 | Ht 72.0 in | Wt 262.0 lb

## 2014-04-18 DIAGNOSIS — R0981 Nasal congestion: Secondary | ICD-10-CM

## 2014-04-18 DIAGNOSIS — J3089 Other allergic rhinitis: Secondary | ICD-10-CM | POA: Insufficient documentation

## 2014-04-18 DIAGNOSIS — M79642 Pain in left hand: Secondary | ICD-10-CM

## 2014-04-18 MED ORDER — AMOXICILLIN-POT CLAVULANATE 875-125 MG PO TABS: 1.0000 | ORAL_TABLET | Freq: Two times a day (BID) | ORAL | Status: DC

## 2014-04-18 NOTE — Progress Notes (Signed)
Pre visit review using our clinic review tool, if applicable. No additional management support is needed unless otherwise documented below in the visit note. 

## 2014-04-18 NOTE — Assessment & Plan Note (Signed)
Symptoms and exam consistent with sinusitis, however cannot rule out underlying allergic rhinitis. Start Augmentin. Recommend starting Allegra, Claritin, or Zyrtec. Continue over-the-counter medications as needed for symptom relief and supportive care. Follow up if symptoms worsen or fail to improve.

## 2014-04-18 NOTE — Assessment & Plan Note (Addendum)
Physical exam benign. Most likely related to muscle strain or unaccustomed activity. Start ibuprofen as needed for soreness. Obtain imaging if symptoms worsen or fail to improve.

## 2014-04-18 NOTE — Progress Notes (Signed)
   Subjective:    Patient ID: Wayne Wise, male    DOB: [birth year], 34 y.o.   MRN: [id number]  Chief Complaint  Patient presents with  . Nasal Congestion    congestion, sneezing, feels like he has fluid in ears, little cough, x4 days    HPI:  Wayne Wise is a 34 y.o. male who presents today for an acute visit.  1) Nasal congestion - This is a new problem. Associated symptoms of congestion, sneezing, cough and feels like fluid behind ears has been going on for about 4 days. Has not tried any OTC medications.   2) Left hand soreness - this is a new problem. Associated symptom of left hand soreness started about 2 days ago when he lifted a pig. The soreness of located on the posterior medial aspect of his left hand and is described mainly as soreness. Denies any sounds or sensations that were heard or felt. Denies any modifying factors.     Allergies  Allergen Reactions  . Sulfa Antibiotics Swelling    No current outpatient prescriptions on file prior to visit.   No current facility-administered medications on file prior to visit.   Past Medical History  Diagnosis Date  . TBI (traumatic brain injury)   . Migraines   . Chicken pox   . 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 Kidney stones   . Arthritis     Review of Systems  HENT: Positive for congestion, ear pain, sneezing and sore throat.   Respiratory: Positive for cough. Negative for chest tightness and shortness of breath.   Neurological: Positive for headaches.      Objective:    BP 162/98 mmHg  Pulse 107  Temp(Src) 98.3 F (36.8 C) (Oral)  Resp 18  Ht 6' (1.829 m)  Wt 262 lb (118.842 kg)  BMI 35.53 kg/m2  SpO2 98% Nursing note and vital signs reviewed.  Physical Exam  Constitutional: He is oriented to person, place, and time. He appears well-developed and well-nourished. No distress.  HENT:  Right Ear: Hearing, tympanic membrane, external ear and ear canal normal.  Left Ear: Hearing, tympanic membrane,  external ear and ear canal normal.  Nose: Right sinus exhibits maxillary sinus tenderness. Right sinus exhibits no frontal sinus tenderness. Left sinus exhibits maxillary sinus tenderness. Left sinus exhibits no frontal sinus tenderness.  Mouth/Throat: Uvula is midline, oropharynx is clear and moist and mucous membranes are normal.  Neck: Neck supple.  Cardiovascular: Normal rate, regular rhythm, normal heart sounds and intact distal pulses.   Pulmonary/Chest: Effort normal and breath sounds normal.  Musculoskeletal:  Left hand: No obvious deformity, discoloration, or edema of left hand noted. A little soreness of soft tissue around the fourth and fifth metacarpals noted. Grip strength is intact and appropriate. Finger abduction and abduction results in soreness.  Neurological: He is alert and oriented to person, place, and time.  Skin: Skin is warm and dry.  Psychiatric: He has a normal mood and affect. His behavior is normal. Judgment and thought content normal.       Assessment & Plan:

## 2014-04-18 NOTE — Patient Instructions (Signed)
Thank you for choosing [facility].  Summary/Instructions:  Your prescription(s) have been submitted to your pharmacy or been printed and provided for you. Please take as directed and contact our office if you believe you are having problem(s) with the medication(s) or have any questions.  If your symptoms worsen or fail to improve, please contact our office for further instruction, or in case of emergency go directly to the emergency room at the closest medical facility.   General Recommendations:    Please drink plenty of fluids.  Get plenty of rest   Sleep in humidified air  Use saline nasal sprays  Netti pot   OTC Medications:  Decongestants - helps relieve congestion   Flonase (generic fluticasone) or Nasacort (generic triamcinolone) - please make sure to use the "cross-over" technique at a 45 degree angle towards the opposite eye as opposed to straight up the nasal passageway.   If you have HIGH BLOOD PRESSURE - Coricidin HBP; AVOID any product that is -D as this contains pseudoephedrine which may increase your blood pressure.  Afrin (oxymetazoline) every 6-8 hours for up to 3 days.   Allergies - helps relieve runny nose, itchy eyes and sneezing   Claritin (generic loratidine), Allegra (fexofenidine), or Zyrtec (generic cyrterizine) for runny nose. These medications should not cause drowsiness.  Note - Benadryl (generic diphenhydramine) may be used however may cause drowsiness  Cough -   Delsym or Robitussin (generic dextromethorphan)  Expectorants - helps loosen mucus to ease removal   Mucinex (generic guaifenesin) as directed on the package.  Headaches / General Aches   Tylenol (generic acetaminophen) - DO NOT EXCEED 3 grams (3,000 mg) in a 24 hour time period  Advil/Motrin (generic ibuprofen)   Sore Throat -   Salt water gargle   Chloraseptic (generic benzocaine) spray or lozenges / Sucrets (generic dyclonine)      

## 2014-04-29 ENCOUNTER — Telehealth: Payer: Self-pay | Admitting: Family

## 2014-04-29 MED ORDER — ONDANSETRON HCL 4 MG PO TABS: 4.0000 mg | ORAL_TABLET | Freq: Three times a day (TID) | ORAL | Status: DC | PRN

## 2014-04-29 NOTE — Telephone Encounter (Signed)
Zofran sent to pharmacy

## 2014-04-29 NOTE — Telephone Encounter (Signed)
Pt called in said that the whole family has the Noro Virus and he as been throwing up since 3am this morning.  He wants to know if greg could call in something to help?

## 2014-05-02 NOTE — Telephone Encounter (Signed)
Pt aware.

## 2014-08-31 ENCOUNTER — Ambulatory Visit (INDEPENDENT_AMBULATORY_CARE_PROVIDER_SITE_OTHER): Payer: 59 | Admitting: Internal Medicine

## 2014-08-31 ENCOUNTER — Ambulatory Visit (INDEPENDENT_AMBULATORY_CARE_PROVIDER_SITE_OTHER)
Admission: RE | Admit: 2014-08-31 | Discharge: 2014-08-31 | Disposition: A | Discharge status: Home / Self Care | Payer: 59 | Source: Ambulatory Visit | Attending: Internal Medicine | Admitting: Internal Medicine

## 2014-08-31 ENCOUNTER — Encounter: Payer: Self-pay | Admitting: Internal Medicine

## 2014-08-31 VITALS — BP 152/90 | HR 90 | Temp 98.1°F | Resp 16 | Wt 250.0 lb

## 2014-08-31 DIAGNOSIS — J209 Acute bronchitis, unspecified: Secondary | ICD-10-CM

## 2014-08-31 DIAGNOSIS — J0110 Acute frontal sinusitis, unspecified: Secondary | ICD-10-CM | POA: Diagnosis not present

## 2014-08-31 DIAGNOSIS — I10 Essential (primary) hypertension: Secondary | ICD-10-CM

## 2014-08-31 MED ORDER — AMOXICILLIN-POT CLAVULANATE 875-125 MG PO TABS: 1.0000 | ORAL_TABLET | Freq: Two times a day (BID) | ORAL | Status: DC

## 2014-08-31 MED ORDER — AMLODIPINE BESYLATE 5 MG PO TABS: 5.0000 mg | ORAL_TABLET | Freq: Every day | ORAL | Status: DC

## 2014-08-31 NOTE — Progress Notes (Signed)
Pre visit review using our clinic review tool, if applicable. No additional management support is needed unless otherwise documented below in the visit note. 

## 2014-08-31 NOTE — Progress Notes (Signed)
   Subjective:    Patient ID: Wayne Wise, male    DOB: [birth year], 34 y.o.   MRN: [id number]  HPI He describes a cough which began 1. 5-2 months ago. This was associated with nasal congestion postnasal drainage and sore throat. Three weeks ago a Z-Pak was prescribed at an urgent care. He was improved for approximately a week following this. The cough however persisted and is productive of dark yellow, thick secretions. He went back to urgent care and was given allergy medicines and Flonase. He's also had a cough syrup which helps the symptoms. He states he wants to "fix the problem".  At this time he has frontal headache and discomfort behind the eyes without associated visual changes. He's had some pain in the upper teeth. Nasal congestion persists. He does describes scant nasal secretions which are light yellow or clear.  He has no history of asthma; he's never smoked.  He's been monitoring his blood pressure at home. The average is in the 140s over 95. There is no family history of hypertension or stroke.   Review of Systems He denies fever or chills. He did have sweats recently. He denies extrinsic symptoms of itchy watery eyes or sneezing.  The cough is not associated with wheezing or shortness of breath.  He has no cardiac symptoms of chest pain, palpitations, claudication, edema, paroxysmal nocturnal dyspnea.    Objective:   Physical Exam  There is marked erythema of the nasal mucosa. He has late inspiratory pops at the right lower lobe.  General appearance:Adequately nourished; no acute distress or increased work of breathing is present.    Lymphatic: No  lymphadenopathy about the head, neck, or axilla .  Eyes: No conjunctival inflammation or lid edema is present. There is no scleral icterus.  Ears:  External ear exam shows no significant lesions or deformities.  Otoscopic examination reveals clear canals, tympanic membranes are intact bilaterally without bulging, retraction,  inflammation or discharge.  Nose:  External nasal examination shows no deformity or inflammation. No septal dislocation or deviation.No obstruction to airflow.   Oral exam: Dental hygiene is good; lips and gums are healthy appearing.There is no oropharyngeal erythema or exudate .  Neck:  No deformities, thyromegaly, masses, or tenderness noted.   Supple with full range of motion without pain.   Heart:  Normal rate and regular rhythm. S1 and S2 normal without gallop, murmur, click, rub or other extra sounds.   Lungs: no wheezes, rhonchi,rales ,or rubs present.  Extremities:  No cyanosis, edema, or clubbing  noted    Skin: Warm & dry w/o tenting or jaundice. No significant lesions or rash.        Assessment & Plan:  #1 rhinosinusitis  #2 bronchitis  #3 hypertension  Plan: See orders and after visit summary

## 2014-08-31 NOTE — Patient Instructions (Signed)
Plain Mucinex (NOT D) for thick secretions ;force NON dairy fluids .   Nasal cleansing in the shower as discussed with lather of mild shampoo.After 10 seconds wash off lather while  exhaling through nostrils. Make sure that all residual soap is removed to prevent irritation.  Flonase OR Nasacort AQ 1 spray in each nostril twice a day as needed. Use the "crossover" technique into opposite nostril spraying toward opposite ear @ 45 degree angle, not straight up into nostril.  Plain Allegra (NOT D )  160 daily , Loratidine 10 mg , OR Zyrtec 10 mg @ bedtime  as needed for itchy eyes & sneezing. Minimal Blood Pressure Goal= AVERAGE < 140/90;  Ideal is an AVERAGE < 135/85. This AVERAGE should be calculated from @ least 5-7 BP readings taken @ different times of day on different days of week. You should not respond to isolated BP readings , but rather the AVERAGE for that week .Please bring your  blood pressure cuff to office visits to verify that it is reliable.It  can also be checked against the blood pressure device at the pharmacy. Finger or wrist cuffs are not dependable; an arm cuff is.  Your next office appointment will be determined based upon review of your pending  xrays  Those written interpretation of the lab results and instructions will be transmitted to you by My Chart  Critical results will be called.   Followup as needed for any active or acute issue. Please report any significant change in your symptoms.

## 2014-09-13 ENCOUNTER — Other Ambulatory Visit: Payer: Self-pay | Admitting: Emergency Medicine

## 2014-09-13 ENCOUNTER — Telehealth: Payer: Self-pay | Admitting: Family

## 2014-09-13 MED ORDER — CEFUROXIME AXETIL 500 MG PO TABS: 500.0000 mg | ORAL_TABLET | Freq: Two times a day (BID) | ORAL | Status: DC

## 2014-09-13 NOTE — Telephone Encounter (Signed)
Patient was in to see Hop on 7/20 and was given antibiotic. He was feeling very well and he finished his medication 3 days ago and he is now gradually feeling worse and today he feels real bad.  Can you send something in? Pharmacy is [facility] on [address] Please advise patient

## 2014-09-13 NOTE — Telephone Encounter (Signed)
Please advise 

## 2014-09-13 NOTE — Telephone Encounter (Signed)
Left msg on triage stating call this am was seen 2 wks ago md rx Augmentin. Completed round of medicine on sat, and since then been feeling worse. Requesting another round of antibiotic, or have md send something else...Johny Chess

## 2014-09-13 NOTE — Telephone Encounter (Signed)
ceftin 500 mg bid #14 Needs OVINB

## 2014-09-14 NOTE — Telephone Encounter (Signed)
Notified pt with md response.../lmb 

## 2014-09-21 ENCOUNTER — Ambulatory Visit: Payer: 59 | Admitting: Family

## 2014-09-21 ENCOUNTER — Ambulatory Visit (INDEPENDENT_AMBULATORY_CARE_PROVIDER_SITE_OTHER): Payer: 59 | Admitting: Family

## 2014-09-21 ENCOUNTER — Encounter: Payer: Self-pay | Admitting: Family

## 2014-09-21 VITALS — BP 142/74 | HR 104 | Temp 98.4°F | Resp 18 | Ht 72.0 in | Wt 255.0 lb

## 2014-09-21 DIAGNOSIS — R059 Cough, unspecified: Secondary | ICD-10-CM

## 2014-09-21 DIAGNOSIS — R05 Cough: Secondary | ICD-10-CM | POA: Diagnosis not present

## 2014-09-21 DIAGNOSIS — I10 Essential (primary) hypertension: Secondary | ICD-10-CM | POA: Insufficient documentation

## 2014-09-21 MED ORDER — PREDNISONE 10 MG PO TABS
ORAL_TABLET | ORAL | Status: DC
Start: 2014-09-21 — End: 2015-01-13

## 2014-09-21 MED ORDER — HYDROCODONE-HOMATROPINE 5-1.5 MG/5ML PO SYRP
5.0000 mL | ORAL_SOLUTION | Freq: Three times a day (TID) | ORAL | Status: DC | PRN
Start: 2014-09-21 — End: 2014-11-03

## 2014-09-21 MED ORDER — AMLODIPINE BESYLATE 10 MG PO TABS: 10.0000 mg | ORAL_TABLET | Freq: Every day | ORAL | Status: DC

## 2014-09-21 NOTE — Assessment & Plan Note (Signed)
Continues to experience slightly improving cough and symptoms consistent with bronchitis. Most likely residual following antibiotic treatment and less likely given 3 courses of antibiotics to be bacterial in nature. Treat symptomatically with OTC medications and start prednisone taper and start hycodan as needed for cough and sleep. Follow up if symptoms do not continue to improve with current regimen.

## 2014-09-21 NOTE — Patient Instructions (Signed)
Thank you for choosing [facility].  Summary/Instructions:  6 Day Prednisone Taper Instructions:   Day 1: Two tablets before breakfast, one after lunch, one after dinner, and two at bedtime.  Day 2: One tablet before breakfast, one after lunch, one after dinner, and two at bedtime Day 3: One tablet before breakfast, one after lunch, one after dinner, and one at bedtime Day 4: One tablet before breakfast, one after lunch, and one at bedtime Day 5: One tablet before breakfast and one at bedtime Day 6: One tablet before breakfast  Your prescription(s) have been submitted to your pharmacy or been printed and provided for you. Please take as directed and contact our office if you believe you are having problem(s) with the medication(s) or have any questions.  Referrals have been made during this visit. You should expect to hear back from our schedulers in about 7-10 days in regards to establishing an appointment with the specialists we discussed.   If your symptoms worsen or fail to improve, please contact our office for further instruction, or in case of emergency go directly to the emergency room at the closest medical facility.    General Recommendations:    Please drink plenty of fluids.  Get plenty of rest   Sleep in humidified air  Use saline nasal sprays  Netti pot   OTC Medications:  Decongestants - helps relieve congestion   Flonase (generic fluticasone) or Nasacort (generic triamcinolone) - please make sure to use the "cross-over" technique at a 45 degree angle towards the opposite eye as opposed to straight up the nasal passageway.   If you have HIGH BLOOD PRESSURE - Coricidin HBP; AVOID any product that is -D as this contains pseudoephedrine which may increase your blood pressure.  Afrin (oxymetazoline) every 6-8 hours for up to 3 days.   Allergies - helps relieve runny nose, itchy eyes and sneezing   Claritin (generic loratidine), Allegra (fexofenidine),  or Zyrtec (generic cyrterizine) for runny nose. These medications should not cause drowsiness.  Note - Benadryl (generic diphenhydramine) may be used however may cause drowsiness  Cough -   Delsym or Robitussin (generic dextromethorphan)  Expectorants - helps loosen mucus to ease removal   Mucinex (generic guaifenesin) as directed on the package.  Headaches / General Aches   Tylenol (generic acetaminophen) - DO NOT EXCEED 3 grams (3,000 mg) in a 24 hour time period  Advil/Motrin (generic ibuprofen)   Sore Throat -   Salt water gargle   Chloraseptic (generic benzocaine) spray or lozenges / Sucrets (generic dyclonine)

## 2014-09-21 NOTE — Progress Notes (Signed)
Subjective:    Patient ID: Wayne Wise, male    DOB: [birth year], 34 y.o.   MRN: [id number]  Chief Complaint  Patient presents with  . Sore Throat    headache, no energy, sore throat, productive cough,     HPI:  Wayne Wise is a 34 y.o. male with a PMH of hypertension,  arthritis, traumatic brain injury, and kidney stones who presents today for an office follow-up.   1.) Cough - Recently seen in the office and diagnosed with acute sinusitis and bronchitis. He was treated with multiple rounds of antibiotics with minimal relief. Continues to experience the associated symptom of headaches, decreased energy, sore throat, and productive cough. Modifying factors include hycodan to help with cough. Notes that he is better when he is on the antibiotics and then feels worse when off of the antibiotics.  2.) Hypertension - currently maintained on 5 mg of amlodipine daily. Takes medication as prescribed and denies adverse side effects. Does not currently take blood pressure at home. Has had several readings during office visits of increased blood pressure.  BP Readings from Last 3 Encounters:  09/21/14 142/74  08/31/14 152/90  04/18/14 162/98     Allergies  Allergen Reactions  . Sulfa Antibiotics Swelling    Current Outpatient Prescriptions on File Prior to Visit  Medication Sig Dispense Refill  . amoxicillin-clavulanate (AUGMENTIN) 875-125 MG per tablet Take 1 tablet by mouth 2 (two) times daily. 20 tablet 0  . cefUROXime (CEFTIN) 500 MG tablet Take 1 tablet (500 mg total) by mouth 2 (two) times daily with a meal. 14 tablet 0  . ondansetron (ZOFRAN) 4 MG tablet Take 1 tablet (4 mg total) by mouth every 8 (eight) hours as needed for nausea or vomiting. 20 tablet 0   No current facility-administered medications on file prior to visit.     Review of Systems  Constitutional: Positive for fatigue. Negative for fever and chills.  HENT: Positive for congestion, sinus pressure and sore  throat.   Neurological: Positive for headaches.      Objective:    BP 142/74 mmHg  Pulse 104  Temp(Src) 98.4 F (36.9 C) (Oral)  Resp 18  Ht 6' (1.829 m)  Wt 255 lb (115.667 kg)  BMI 34.58 kg/m2  SpO2 97% Nursing note and vital signs reviewed.  Physical Exam  Constitutional: He is oriented to person, place, and time. He appears well-developed and well-nourished. No distress.  HENT:  Right Ear: Hearing, tympanic membrane, external ear and ear canal normal.  Left Ear: Hearing, tympanic membrane, external ear and ear canal normal.  Nose: Nose normal. Right sinus exhibits no maxillary sinus tenderness and no frontal sinus tenderness. Left sinus exhibits no maxillary sinus tenderness and no frontal sinus tenderness.  Mouth/Throat: Uvula is midline, oropharynx is clear and moist and mucous membranes are normal.  Cardiovascular: Normal rate, regular rhythm, normal heart sounds and intact distal pulses.   Pulmonary/Chest: Effort normal and breath sounds normal.  Neurological: He is alert and oriented to person, place, and time.  Skin: Skin is warm and dry.  Psychiatric: He has a normal mood and affect. His behavior is normal. Judgment and thought content normal.       Assessment & Plan:   Problem List Items Addressed This Visit      Cardiovascular and Mediastinum   Essential hypertension    Previously treated with 5 mg of amlodipine. Has had office visits that are greater than goal of 140/90. Increase amlodipine  to 10 mg daily. Follow up in 2 weeks for nurse visit to check blood pressure or sooner if needed. Continue to monitor blood pressure at home.       Relevant Medications   amLODipine (NORVASC) 10 MG tablet     Other   Cough - Primary    Continues to experience slightly improving cough and symptoms consistent with bronchitis. Most likely residual following antibiotic treatment and less likely given 3 courses of antibiotics to be bacterial in nature. Treat symptomatically  with OTC medications and start prednisone taper and start hycodan as needed for cough and sleep. Follow up if symptoms do not continue to improve with current regimen.       Relevant Medications   predniSONE (DELTASONE) 10 MG tablet   HYDROcodone-homatropine (HYCODAN) 5-1.5 MG/5ML syrup

## 2014-09-21 NOTE — Assessment & Plan Note (Signed)
Previously treated with 5 mg of amlodipine. Has had office visits that are greater than goal of 140/90. Increase amlodipine to 10 mg daily. Follow up in 2 weeks for nurse visit to check blood pressure or sooner if needed. Continue to monitor blood pressure at home.

## 2014-09-21 NOTE — Progress Notes (Signed)
Pre visit review using our clinic review tool, if applicable. No additional management support is needed unless otherwise documented below in the visit note. 

## 2014-10-04 ENCOUNTER — Ambulatory Visit: Payer: 59

## 2014-10-04 VITALS — BP 142/90

## 2014-10-04 DIAGNOSIS — Z0130 Encounter for examination of blood pressure without abnormal findings: Secondary | ICD-10-CM

## 2014-10-05 ENCOUNTER — Telehealth: Payer: Self-pay | Admitting: Family

## 2014-10-05 MED ORDER — METOPROLOL SUCCINATE ER 25 MG PO TB24: 25.0000 mg | ORAL_TABLET | Freq: Every day | ORAL | Status: DC

## 2014-10-05 NOTE — Telephone Encounter (Signed)
The blood pressure is very close to goal, so I will add a low dose metoprolol for him to take once daily with the amlodipine.

## 2014-10-05 NOTE — Telephone Encounter (Signed)
-----   Message from Ander Slade, RN sent at 10/04/2014  3:59 PM EDT ----- Patient came in today (nurse visit) for bp recheck-----reading was 142/90, patient has been on bp meds for appx 3 weeks, but also forgot to take meds this past weekend (Saturday & Sunday)---please advise, thanks

## 2014-10-06 NOTE — Telephone Encounter (Signed)
Left message advising patient that metoprolol has been added to his medication regimen, has been to pharm

## 2014-11-03 ENCOUNTER — Ambulatory Visit (INDEPENDENT_AMBULATORY_CARE_PROVIDER_SITE_OTHER): Payer: 59 | Admitting: Family

## 2014-11-03 ENCOUNTER — Encounter: Payer: Self-pay | Admitting: Family

## 2014-11-03 VITALS — BP 130/84 | HR 99 | Temp 97.7°F | Resp 18 | Ht 72.0 in | Wt 273.0 lb

## 2014-11-03 DIAGNOSIS — R059 Cough, unspecified: Secondary | ICD-10-CM

## 2014-11-03 DIAGNOSIS — R05 Cough: Secondary | ICD-10-CM

## 2014-11-03 MED ORDER — LEVOFLOXACIN 500 MG PO TABS: 500.0000 mg | ORAL_TABLET | Freq: Every day | ORAL | Status: DC

## 2014-11-03 MED ORDER — PROMETHAZINE-CODEINE 6.25-10 MG/5ML PO SYRP: 5.0000 mL | ORAL_SOLUTION | Freq: Four times a day (QID) | ORAL | Status: DC | PRN

## 2014-11-03 NOTE — Progress Notes (Signed)
Pre visit review using our clinic review tool, if applicable. No additional management support is needed unless otherwise documented below in the visit note. 

## 2014-11-03 NOTE — Patient Instructions (Signed)
Thank you for choosing [facility].  Summary/Instructions:  Your prescription(s) have been submitted to your pharmacy or been printed and provided for you. Please take as directed and contact our office if you believe you are having problem(s) with the medication(s) or have any questions.  If your symptoms worsen or fail to improve, please contact our office for further instruction, or in case of emergency go directly to the emergency room at the closest medical facility.    Upper Respiratory Infection, Adult An upper respiratory infection (URI) is also sometimes known as the common cold. The upper respiratory tract includes the nose, sinuses, throat, trachea, and bronchi. Bronchi are the airways leading to the lungs. Most people improve within 1 week, but symptoms can last up to 2 weeks. A residual cough may last even longer.  CAUSES Many different viruses can infect the tissues lining the upper respiratory tract. The tissues become irritated and inflamed and often become very moist. Mucus production is also common. A cold is contagious. You can easily spread the virus to others by oral contact. This includes kissing, sharing a glass, coughing, or sneezing. Touching your mouth or nose and then touching a surface, which is then touched by another person, can also spread the virus. SYMPTOMS  Symptoms typically develop 1 to 3 days after you come in contact with a cold virus. Symptoms vary from person to person. They may include:  Runny nose.  Sneezing.  Nasal congestion.  Sinus irritation.  Sore throat.  Loss of voice (laryngitis).  Cough.  Fatigue.  Muscle aches.  Loss of appetite.  Headache.  Low-grade fever. DIAGNOSIS  You might diagnose your own cold based on familiar symptoms, since most people get a cold 2 to 3 times a year. Your caregiver can confirm this based on your exam. Most importantly, your caregiver can check that your symptoms are not due to another  disease such as strep throat, sinusitis, pneumonia, asthma, or epiglottitis. Blood tests, throat tests, and X-rays are not necessary to diagnose a common cold, but they may sometimes be helpful in excluding other more serious diseases. Your caregiver will decide if any further tests are required. RISKS AND COMPLICATIONS  You may be at risk for a more severe case of the common cold if you smoke cigarettes, have chronic heart disease (such as heart failure) or lung disease (such as asthma), or if you have a weakened immune system. The very young and very old are also at risk for more serious infections. Bacterial sinusitis, middle ear infections, and bacterial pneumonia can complicate the common cold. The common cold can worsen asthma and chronic obstructive pulmonary disease (COPD). Sometimes, these complications can require emergency medical care and may be life-threatening. PREVENTION  The best way to protect against getting a cold is to practice good hygiene. Avoid oral or hand contact with people with cold symptoms. Wash your hands often if contact occurs. There is no clear evidence that vitamin C, vitamin E, echinacea, or exercise reduces the chance of developing a cold. However, it is always recommended to get plenty of rest and practice good nutrition. TREATMENT  Treatment is directed at relieving symptoms. There is no cure. Antibiotics are not effective, because the infection is caused by a virus, not by bacteria. Treatment may include:  Increased fluid intake. Sports drinks offer valuable electrolytes, sugars, and fluids.  Breathing heated mist or steam (vaporizer or shower).  Eating chicken soup or other clear broths, and maintaining good nutrition.  Getting plenty of   rest.  Using gargles or lozenges for comfort.  Controlling fevers with ibuprofen or acetaminophen as directed by your caregiver.  Increasing usage of your inhaler if you have asthma. Zinc gel and zinc lozenges, taken in  the first 24 hours of the common cold, can shorten the duration and lessen the severity of symptoms. Pain medicines may help with fever, muscle aches, and throat pain. A variety of non-prescription medicines are available to treat congestion and runny nose. Your caregiver can make recommendations and may suggest nasal or lung inhalers for other symptoms.  HOME CARE INSTRUCTIONS   Only take over-the-counter or prescription medicines for pain, discomfort, or fever as directed by your caregiver.  Use a warm mist humidifier or inhale steam from a shower to increase air moisture. This may keep secretions moist and make it easier to breathe.  Drink enough water and fluids to keep your urine clear or pale yellow.  Rest as needed.  Return to work when your temperature has returned to normal or as your caregiver advises. You may need to stay home longer to avoid infecting others. You can also use a face mask and careful hand washing to prevent spread of the virus. SEEK MEDICAL CARE IF:   After the first few days, you feel you are getting worse rather than better.  You need your caregiver's advice about medicines to control symptoms.  You develop chills, worsening shortness of breath, or brown or red sputum. These may be signs of pneumonia.  You develop yellow or brown nasal discharge or pain in the face, especially when you bend forward. These may be signs of sinusitis.  You develop a fever, swollen neck glands, pain with swallowing, or white areas in the back of your throat. These may be signs of strep throat. SEEK IMMEDIATE MEDICAL CARE IF:   You have a fever.  You develop severe or persistent headache, ear pain, sinus pain, or chest pain.  You develop wheezing, a prolonged cough, cough up blood, or have a change in your usual mucus (if you have chronic lung disease).  You develop sore muscles or a stiff neck. Document Released: 07/24/2000 Document Revised: 04/22/2011 Document Reviewed:  05/05/2013 ExitCare Patient Information 2015 [employer]. This information is not intended to replace advice given to you by your health care provider. Make sure you discuss any questions you have with your health care provider.   

## 2014-11-03 NOTE — Assessment & Plan Note (Addendum)
Symptoms and exam consistent with acute upper respiratory infection most likely bacterial given symptoms. Start levofloxacin secondary to recent Augmentin and Ceftin use. Start promethazine with codeine for cough and sleep.  Continue over the counter medications as needed for symptom relief and supportive care. Follow up if symptoms worsen or fail to improve.

## 2014-11-03 NOTE — Progress Notes (Signed)
Subjective:    Patient ID: Wayne Wise, male    DOB: October 11, 1980, 34 y.o.   MRN: [id number]  Chief Complaint  Patient presents with  . Cough    x2 weeks, congestion, not able to breathe, productive cough    HPI:  Wayne Wise is a 34 y.o. male who  has a past medical history of TBI (traumatic brain injury); Migraines; Chicken pox; Headaches due to old head trauma; Kidney stones; and Arthritis. and presents today for an acute office visit.  This is a new problem. Associated symptom of congestion, productive cough and decreased ability to breath have been going on for about 2 weeks. Modifying factors include saline nasal sprays and afrin which have helped minimally.   Allergies  Allergen Reactions  . Sulfa Antibiotics Swelling     Current Outpatient Prescriptions on File Prior to Visit  Medication Sig Dispense Refill  . amLODipine (NORVASC) 10 MG tablet Take 1 tablet (10 mg total) by mouth daily. 30 tablet 2  . metoprolol succinate (TOPROL-XL) 25 MG 24 hr tablet Take 1 tablet (25 mg total) by mouth daily. 90 tablet 3  . ondansetron (ZOFRAN) 4 MG tablet Take 1 tablet (4 mg total) by mouth every 8 (eight) hours as needed for nausea or vomiting. 20 tablet 0  . predniSONE (DELTASONE) 10 MG tablet Take 6 tablets x 1 day, 5 tablets x 1 day, 4 tablets x 1 day, 3 tablets x 2 days, 2 tablets x 1 day, 1 tablet x 1 day. 21 tablet 0   No current facility-administered medications on file prior to visit.     Review of Systems  Constitutional: Negative for fever and chills.  HENT: Positive for congestion, sinus pressure and sore throat.   Respiratory: Positive for cough.   Neurological: Positive for headaches.      Objective:    BP 130/84 mmHg  Pulse 99  Temp(Src) 97.7 F (36.5 C) (Oral)  Resp 18  Ht 6' (1.829 m)  Wt 273 lb (123.832 kg)  BMI 37.02 kg/m2  SpO2 97% Nursing note and vital signs reviewed.  Physical Exam  Constitutional: He is oriented to person, place, and time. He  appears well-developed and well-nourished. No distress.  HENT:  Right Ear: Hearing, tympanic membrane, external ear and ear canal normal.  Left Ear: Hearing, tympanic membrane, external ear and ear canal normal.  Nose: Right sinus exhibits no maxillary sinus tenderness and no frontal sinus tenderness. Left sinus exhibits no maxillary sinus tenderness and no frontal sinus tenderness.  Mouth/Throat: Uvula is midline, oropharynx is clear and moist and mucous membranes are normal.  Cardiovascular: Normal rate, regular rhythm, normal heart sounds and intact distal pulses.   Pulmonary/Chest: Effort normal and breath sounds normal.  Neurological: He is alert and oriented to person, place, and time.  Skin: Skin is warm and dry.  Psychiatric: He has a normal mood and affect. His behavior is normal. Judgment and thought content normal.       Assessment & Plan:   Problem List Items Addressed This Visit      Other   Cough - Primary    Symptoms and exam consistent with acute upper respiratory infection most likely bacterial given symptoms. Start levofloxacin secondary to recent Augmentin and Ceftin use. Start promethazine with codeine for cough and sleep.  Continue over the counter medications as needed for symptom relief and supportive care. Follow up if symptoms worsen or fail to improve.       Relevant  Medications   promethazine-codeine (PHENERGAN WITH CODEINE) 6.25-10 MG/5ML syrup   levofloxacin (LEVAQUIN) 500 MG tablet

## 2015-01-03 ENCOUNTER — Telehealth: Payer: Self-pay | Admitting: Family

## 2015-01-03 DIAGNOSIS — I10 Essential (primary) hypertension: Secondary | ICD-10-CM

## 2015-01-03 NOTE — Telephone Encounter (Signed)
Patient is requesting refill for amLODipine (NORVASC) 10 MG tablet [id number] and metoprolol succinate (TOPROL-XL) 25 MG 24 hr tablet [id number]  Pharmacy is [facility] on [address].  He also states he has been really tired lately and is wondering if he may need a sleep study. He also thinks it could be low testosterone or maybe thyroid. He just wanted to know what you think about that.  Please advise

## 2015-01-04 MED ORDER — METOPROLOL SUCCINATE ER 25 MG PO TB24: 25.0000 mg | ORAL_TABLET | Freq: Every day | ORAL | Status: DC

## 2015-01-04 MED ORDER — AMLODIPINE BESYLATE 10 MG PO TABS: 10.0000 mg | ORAL_TABLET | Freq: Every day | ORAL | Status: DC

## 2015-01-04 NOTE — Telephone Encounter (Signed)
Do you want me to advise that pt come in for an OV?

## 2015-01-08 NOTE — Telephone Encounter (Signed)
Will need follow office visit to discuss fatigue and tiredness.

## 2015-01-09 NOTE — Telephone Encounter (Signed)
Called pt to let him know he should make an office visit. Made an appt for pt to come in on friday

## 2015-01-13 ENCOUNTER — Encounter: Payer: Self-pay | Admitting: Family

## 2015-01-13 ENCOUNTER — Ambulatory Visit (INDEPENDENT_AMBULATORY_CARE_PROVIDER_SITE_OTHER): Payer: 59 | Admitting: Family

## 2015-01-13 VITALS — BP 120/78 | HR 104 | Temp 98.0°F | Resp 18 | Ht 72.0 in | Wt 273.0 lb

## 2015-01-13 DIAGNOSIS — R5383 Other fatigue: Secondary | ICD-10-CM | POA: Diagnosis not present

## 2015-01-13 NOTE — Patient Instructions (Addendum)
Thank you for choosing [facility].  Summary/Instructions:  Please stop by the lab on the basement level of the building for your blood work. Your results will be released to [place] (or called to you) after review, usually within 72 hours after test completion. If any changes need to be made, you will be notified at that same time.  If your symptoms worsen or fail to improve, please contact our office for further instruction, or in case of emergency go directly to the emergency room at the closest medical facility.    Fatigue Fatigue is feeling tired all of the time, a lack of energy, or a lack of motivation. Occasional or mild fatigue is often a normal response to activity or life in general. However, long-lasting (chronic) or extreme fatigue may indicate an underlying medical condition. HOME CARE INSTRUCTIONS  Watch your fatigue for any changes. The following actions may help to lessen any discomfort you are feeling:  Talk to your health care provider about how much sleep you need each night. Try to get the required amount every night.  Take medicines only as directed by your health care provider.  Eat a healthy and nutritious diet. Ask your health care provider if you need help changing your diet.  Drink enough fluid to keep your urine clear or pale yellow.  Practice ways of relaxing, such as yoga, meditation, massage therapy, or acupuncture.  Exercise regularly.   Change situations that cause you stress. Try to keep your work and personal routine reasonable.  Do not abuse illegal drugs.  Limit alcohol intake to no more than 1 drink per day for nonpregnant women and 2 drinks per day for men. One drink equals 12 ounces of beer, 5 ounces of wine, or 1 ounces of hard liquor.  Take a multivitamin, if directed by your health care provider. SEEK MEDICAL CARE IF:   Your fatigue does not get better.  You have a fever.   You have unintentional weight loss or gain.  You  have headaches.   You have difficulty:   Falling asleep.  Sleeping throughout the night.  You feel angry, guilty, anxious, or sad.   You are unable to have a bowel movement (constipation).   You skin is dry.   Your legs or another part of your body is swollen.  SEEK IMMEDIATE MEDICAL CARE IF:   You feel confused.   Your vision is blurry.  You feel faint or pass out.   You have a severe headache.   You have severe abdominal, pelvic, or back pain.   You have chest pain, shortness of breath, or an irregular or fast heartbeat.   You are unable to urinate or you urinate less than normal.   You develop abnormal bleeding, such as bleeding from the rectum, vagina, nose, lungs, or nipples.  You vomit blood.   You have thoughts about harming yourself or committing suicide.   You are worried that you might harm someone else.    This information is not intended to replace advice given to you by your health care provider. Make sure you discuss any questions you have with your health care provider.   Document Released: 11/25/2006 Document Revised: 02/18/2014 Document Reviewed: 06/01/2013 Elsevier Interactive Patient Education [employer].

## 2015-01-13 NOTE — Progress Notes (Signed)
   Subjective:    Patient ID: Wayne Wise, male    DOB: 1980/06/06, 34 y.o.   MRN: [id number]  Chief Complaint  Patient presents with  . Fatigue    HPI:  Wayne Wise is a 34 y.o. male who  has a past medical history of TBI (traumatic brain injury) ([place]); Migraines; Chicken pox; Headaches due to old head trauma; Kidney stones; and Arthritis. and presents today for an office visit.  Associated symptom of fatigue has been going for approximately several years. Describes that he has no energy at times. Averages about 6-7 hours of sleep at night. He has been told that he snores at night and may have periods of time of apnea from time to time. Notes day time sleepiness that he could take a nap. EPS score of 13. Denies changes to skin or nails or temperature intolerance. There are no modifying factors that make it better or worse.   Allergies  Allergen Reactions  . Sulfa Antibiotics Swelling     Current Outpatient Prescriptions on File Prior to Visit  Medication Sig Dispense Refill  . amLODipine (NORVASC) 10 MG tablet Take 1 tablet (10 mg total) by mouth daily. 30 tablet 2  . metoprolol succinate (TOPROL-XL) 25 MG 24 hr tablet Take 1 tablet (25 mg total) by mouth daily. 90 tablet 3   No current facility-administered medications on file prior to visit.    Review of Systems  Constitutional: Positive for fatigue. Negative for fever, chills and unexpected weight change.  Respiratory: Negative for chest tightness and shortness of breath.       Objective:    BP 120/78 mmHg  Pulse 104  Temp(Src) 98 F (36.7 C) (Oral)  Resp 18  Ht 6' (1.829 m)  Wt 273 lb (123.832 kg)  BMI 37.02 kg/m2  SpO2 97% Nursing note and vital signs reviewed.  Physical Exam  Constitutional: He is oriented to person, place, and time. He appears well-developed and well-nourished. No distress.  Cardiovascular: Normal rate, regular rhythm, normal heart sounds and intact distal pulses.   Pulmonary/Chest: Effort  normal and breath sounds normal.  Neurological: He is alert and oriented to person, place, and time.  Skin: Skin is warm and dry.  Psychiatric: He has a normal mood and affect. His behavior is normal. Judgment and thought content normal.       Assessment & Plan:   Problem List Items Addressed This Visit      Other   Fatigue - Primary    Fatigue of undetermined origin. Obtain TSH, testosterone, nocturnal polysomnograph, methylmalonic acid, IBC panel,hemoglobin A1c, CBC, and B12/folate to rule out metabolic and potential sleep apnea causes. Cannot rule out underlying depression, anxiety, or cardiovascular disease. Follow-up pending blood work.      Relevant Orders   B12 and Folate Panel   CBC   Hemoglobin A1c   IBC panel   Methylmalonic acid, serum   Testosterone   TSH   Nocturnal polysomnography (NPSG)

## 2015-01-13 NOTE — Progress Notes (Signed)
Pre visit review using our clinic review tool, if applicable. No additional management support is needed unless otherwise documented below in the visit note. 

## 2015-01-13 NOTE — Assessment & Plan Note (Signed)
Fatigue of undetermined origin. Obtain TSH, testosterone, nocturnal polysomnograph, methylmalonic acid, IBC panel,hemoglobin A1c, CBC, and B12/folate to rule out metabolic and potential sleep apnea causes. Cannot rule out underlying depression, anxiety, or cardiovascular disease. Follow-up pending blood work.

## 2015-01-16 ENCOUNTER — Other Ambulatory Visit (INDEPENDENT_AMBULATORY_CARE_PROVIDER_SITE_OTHER): Payer: 59

## 2015-01-16 DIAGNOSIS — R5383 Other fatigue: Secondary | ICD-10-CM

## 2015-01-16 LAB — HEMOGLOBIN A1C: Hgb A1c MFr Bld: 5.2 % (ref 4.6–6.5)

## 2015-01-16 LAB — IBC PANEL
Iron: 117 ug/dL (ref 42–165)
SATURATION RATIOS: 41 % (ref 20.0–50.0)
TRANSFERRIN: 204 mg/dL — AB (ref 212.0–360.0)

## 2015-01-16 LAB — CBC
HCT: 47.5 % (ref 39.0–52.0)
HEMOGLOBIN: 16 g/dL (ref 13.0–17.0)
MCHC: 33.7 g/dL (ref 30.0–36.0)
MCV: 91.5 fl (ref 78.0–100.0)
PLATELETS: 251 10*3/uL (ref 150.0–400.0)
RBC: 5.19 Mil/uL (ref 4.22–5.81)
RDW: 12.4 % (ref 11.5–15.5)
WBC: 6.4 10*3/uL (ref 4.0–10.5)

## 2015-01-16 LAB — TSH: TSH: 2.43 u[IU]/mL (ref 0.35–4.50)

## 2015-01-16 LAB — TESTOSTERONE: TESTOSTERONE: 175.83 ng/dL — AB (ref 300.00–890.00)

## 2015-01-16 LAB — B12 AND FOLATE PANEL
FOLATE: 5.8 ng/mL — AB (ref >5.9)
Vitamin B-12: 309 pg/mL (ref 211–911)

## 2015-01-17 ENCOUNTER — Telehealth: Payer: Self-pay | Admitting: Family

## 2015-01-17 NOTE — Telephone Encounter (Signed)
Please inform patient that his thyroid function, A1c, and white/red blood cells are within the normal limits. His testosterone was low which we will need to repeat to confirm. We are still awaiting the B12 results.

## 2015-01-19 LAB — METHYLMALONIC ACID, SERUM: METHYLMALONIC ACID, QUANT: 176 nmol/L (ref 87–318)

## 2015-01-20 NOTE — Telephone Encounter (Signed)
LVM for pt to call back.

## 2015-01-20 NOTE — Telephone Encounter (Signed)
Pt aware of results 

## 2015-02-01 ENCOUNTER — Telehealth: Payer: Self-pay | Admitting: Family

## 2015-02-01 DIAGNOSIS — R7989 Other specified abnormal findings of blood chemistry: Secondary | ICD-10-CM

## 2015-02-01 DIAGNOSIS — R0683 Snoring: Secondary | ICD-10-CM

## 2015-02-01 NOTE — Telephone Encounter (Signed)
Pt called in and would like a call back about his lab results , can you call him when you get a chance

## 2015-02-02 NOTE — Telephone Encounter (Signed)
Called pt back and let him know B12 is normal and the best option for him is to get another blood panel done for testosterone and start therapy if needed to see if that helps with fatigue. Checked on the status for sleep study and will not be able to be seen until April. Pt agreed to get blood work done. Put in another testosterone panel for pt to do.

## 2015-02-07 ENCOUNTER — Other Ambulatory Visit (INDEPENDENT_AMBULATORY_CARE_PROVIDER_SITE_OTHER): Payer: 59

## 2015-02-07 DIAGNOSIS — E291 Testicular hypofunction: Secondary | ICD-10-CM | POA: Diagnosis not present

## 2015-02-07 DIAGNOSIS — R7989 Other specified abnormal findings of blood chemistry: Secondary | ICD-10-CM

## 2015-02-07 LAB — TESTOSTERONE: Testosterone: 148.41 ng/dL — ABNORMAL LOW (ref 300.00–890.00)

## 2015-02-08 ENCOUNTER — Telehealth: Payer: Self-pay | Admitting: Family

## 2015-02-08 NOTE — Telephone Encounter (Signed)
Please inform patient that his testosterone levels remain low indicating male hypogonadism or low testosterone which may be a significant cause of his fatigue. The treatment for this is testosterone supplementation. If you like to consider supplementation please have him make a follow-up appointment to discuss his options.

## 2015-02-10 NOTE — Telephone Encounter (Signed)
Pt aware of results 

## 2015-02-15 ENCOUNTER — Encounter: Payer: Self-pay | Admitting: Family

## 2015-02-15 ENCOUNTER — Ambulatory Visit (INDEPENDENT_AMBULATORY_CARE_PROVIDER_SITE_OTHER): Payer: 59 | Admitting: Family

## 2015-02-15 VITALS — BP 130/78 | HR 117 | Temp 98.3°F | Resp 20 | Ht 73.0 in | Wt 263.0 lb

## 2015-02-15 DIAGNOSIS — E291 Testicular hypofunction: Secondary | ICD-10-CM | POA: Diagnosis not present

## 2015-02-15 MED ORDER — TESTOSTERONE ENANTHATE 200 MG/ML IM SOLN: 100.0000 mg | INTRAMUSCULAR | Status: DC

## 2015-02-15 NOTE — Progress Notes (Signed)
Pre visit review using our clinic review tool, if applicable. No additional management support is needed unless otherwise documented below in the visit note. 

## 2015-02-15 NOTE — Patient Instructions (Signed)
Thank you for choosing [facility].  Summary/Instructions:  Your prescription(s) have been submitted to your pharmacy or been printed and provided for you. Please take as directed and contact our office if you believe you are having problem(s) with the medication(s) or have any questions.  If your symptoms worsen or fail to improve, please contact our office for further instruction, or in case of emergency go directly to the emergency room at the closest medical facility.   Testosterone injection What is this medicine? TESTOSTERONE (tes TOS ter one) is the main male hormone. It supports normal male development such as muscle growth, facial hair, and deep voice. It is used in males to treat low testosterone levels. This medicine may be used for other purposes; ask your health care provider or pharmacist if you have questions. What should I tell my health care provider before I take this medicine? They need to know if you have any of these conditions: -breast cancer -diabetes -heart disease -kidney disease -liver disease -lung disease -prostate cancer, enlargement -an unusual or allergic reaction to testosterone, other medicines, foods, dyes, or preservatives -pregnant or trying to get pregnant -breast-feeding How should I use this medicine? This medicine is for injection into a muscle. It is usually given by a health care professional in a hospital or clinic setting. Contact your pediatrician regarding the use of this medicine in children. While this medicine may be prescribed for children as young as 52 years of age for selected conditions, precautions do apply. Overdosage: If you think you have taken too much of this medicine contact a poison control center or emergency room at once. NOTE: This medicine is only for you. Do not share this medicine with others. What if I miss a dose? Try not to miss a dose. Your doctor or health care professional will tell you when your next  injection is due. Notify the office if you are unable to keep an appointment. What may interact with this medicine? -medicines for diabetes -medicines that treat or prevent blood clots like warfarin -oxyphenbutazone -propranolol -steroid medicines like prednisone or cortisone This list may not describe all possible interactions. Give your health care provider a list of all the medicines, herbs, non-prescription drugs, or dietary supplements you use. Also tell them if you smoke, drink alcohol, or use illegal drugs. Some items may interact with your medicine. What should I watch for while using this medicine? Visit your doctor or health care professional for regular checks on your progress. They will need to check the level of testosterone in your blood. This medicine is only approved for use in men who have low levels of testosterone related to certain medical conditions. Heart attacks and strokes have been reported with the use of this medicine. Notify your doctor or health care professional and seek emergency treatment if you develop breathing problems; changes in vision; confusion; chest pain or chest tightness; sudden arm pain; severe, sudden headache; trouble speaking or understanding; sudden numbness or weakness of the face, arm or leg; loss of balance or coordination. Talk to your doctor about the risks and benefits of this medicine. This medicine may affect blood sugar levels. If you have diabetes, check with your doctor or health care professional before you change your diet or the dose of your diabetic medicine. This drug is banned from use in athletes by most athletic organizations. What side effects may I notice from receiving this medicine? Side effects that you should report to your doctor or health care professional  as soon as possible: -allergic reactions like skin rash, itching or hives, swelling of the face, lips, or tongue -breast enlargement -breathing problems -changes in mood,  especially anger, depression, or rage -dark urine -general ill feeling or flu-like symptoms -light-colored stools -loss of appetite, nausea -nausea, vomiting -right upper belly pain -stomach pain -swelling of ankles -too frequent or persistent erections -trouble passing urine or change in the amount of urine -unusually weak or tired -yellowing of the eyes or skin Additional side effects that can occur in women include: -deep or hoarse voice -facial hair growth -irregular menstrual periods Side effects that usually do not require medical attention (report to your doctor or health care professional if they continue or are bothersome): -acne -change in sex drive or performance -hair loss -headache This list may not describe all possible side effects. Call your doctor for medical advice about side effects. You may report side effects to FDA at 1-800-FDA-1088. Where should I keep my medicine? Keep out of the reach of children. This medicine can be abused. Keep your medicine in a safe place to protect it from theft. Do not share this medicine with anyone. Selling or giving away this medicine is dangerous and against the law. Store at room temperature between 20 and 25 degrees C (68 and 77 degrees F). Do not freeze. Protect from light. Follow the directions for the product you are prescribed. Throw away any unused medicine after the expiration date. NOTE: This sheet is a summary. It may not cover all possible information. If you have questions about this medicine, talk to your doctor, pharmacist, or health care provider.    2016, Elsevier/Gold Standard. (2013-04-15 [id number])

## 2015-02-15 NOTE — Progress Notes (Signed)
   Subjective:    Patient ID: Wayne Wise, male    DOB: 31-Mar-1980, 35 y.o.   MRN: [id number]  Chief Complaint  Patient presents with  . Follow-up    testosterone     HPI:  Wayne Wise is a 35 y.o. male who  has a past medical history of TBI (traumatic brain injury) ([place]); Migraines; Chicken pox; Headaches due to old head trauma; Kidney stones; and Arthritis. and presents today for a follow up office visit.  Recently evaluated in the office for fatigue and found to have low testosterone on 2 separate occasions between the hours of 8-10am which is consistent with low testosterone. Continues to experience associated symptom of fatigue and is currently being evaluated for possible sleep apnea as well. Presents today to discuss treatments for low testosterone.  Allergies  Allergen Reactions  . Sulfa Antibiotics Swelling    Current Outpatient Prescriptions on File Prior to Visit  Medication Sig Dispense Refill  . amLODipine (NORVASC) 10 MG tablet Take 1 tablet (10 mg total) by mouth daily. 30 tablet 2  . metoprolol succinate (TOPROL-XL) 25 MG 24 hr tablet Take 1 tablet (25 mg total) by mouth daily. 90 tablet 3   No current facility-administered medications on file prior to visit.    Review of Systems  Constitutional: Positive for fatigue. Negative for fever and chills.  Respiratory: Negative for cough, chest tightness and shortness of breath.   Cardiovascular: Negative for chest pain, palpitations and leg swelling.  Neurological: Negative for headaches.      Objective:    BP 130/78 mmHg  Pulse 117  Temp(Src) 98.3 F (36.8 C) (Oral)  Resp 20  Ht 6\' 1"  (1.854 m)  Wt 263 lb (119.296 kg)  BMI 34.71 kg/m2  SpO2 97% Nursing note and vital signs reviewed.  Physical Exam  Constitutional: He is oriented to person, place, and time. He appears well-developed and well-nourished. No distress.  Cardiovascular: Normal rate, regular rhythm, normal heart sounds and intact distal pulses.     Pulmonary/Chest: Effort normal and breath sounds normal.  Neurological: He is alert and oriented to person, place, and time.  Skin: Skin is warm and dry.  Psychiatric: He has a normal mood and affect. His behavior is normal. Judgment and thought content normal.       Assessment & Plan:   Problem List Items Addressed This Visit      Endocrine   Hypogonadism in male - Primary    Hypogonadism confirm with multiple low testosterone values. Discussed risks and benefits of testosterone medications including patches, gels, and injections. Patient wishes to continue with therapy for testosterone injections. Start testosterone enanthate. Most recent hematocrit was 47. Follow-up pending testosterone treatment initiation.      Relevant Medications   testosterone enanthate (DELATESTRYL) 200 MG/ML injection

## 2015-02-15 NOTE — Assessment & Plan Note (Signed)
Hypogonadism confirm with multiple low testosterone values. Discussed risks and benefits of testosterone medications including patches, gels, and injections. Patient wishes to continue with therapy for testosterone injections. Start testosterone enanthate. Most recent hematocrit was 47. Follow-up pending testosterone treatment initiation.

## 2015-02-20 ENCOUNTER — Telehealth: Payer: Self-pay | Admitting: Family

## 2015-02-20 DIAGNOSIS — E291 Testicular hypofunction: Secondary | ICD-10-CM

## 2015-02-20 NOTE — Telephone Encounter (Signed)
Pt called said that his testosterone injections came in already and wanted to let Marya Amsler know.,  And wanted to know when he should come back to do check blood work?

## 2015-02-20 NOTE — Telephone Encounter (Signed)
Please advise 

## 2015-02-20 NOTE — Telephone Encounter (Signed)
Two months after starting during a week with no injections.

## 2015-02-21 NOTE — Telephone Encounter (Signed)
Pt aware.

## 2015-03-27 ENCOUNTER — Ambulatory Visit (INDEPENDENT_AMBULATORY_CARE_PROVIDER_SITE_OTHER): Payer: 59 | Admitting: Family

## 2015-03-27 ENCOUNTER — Encounter: Payer: Self-pay | Admitting: Family

## 2015-03-27 VITALS — BP 136/78 | HR 86 | Temp 97.9°F | Resp 18 | Ht 73.0 in | Wt 259.0 lb

## 2015-03-27 DIAGNOSIS — E291 Testicular hypofunction: Secondary | ICD-10-CM | POA: Diagnosis not present

## 2015-03-27 NOTE — Progress Notes (Signed)
   Subjective:    Patient ID: Wayne Wise, male    DOB: 22-Aug-1980, 35 y.o.   MRN: [id number]  Chief Complaint  Patient presents with  . Follow-up    HPI:  Wayne Wise is a 35 y.o. male who  has a past medical history of TBI (traumatic brain injury) ([place]); Migraines; Chicken pox; Headaches due to old head trauma; Kidney stones; and Arthritis. and presents today for a follow up office visit.   Hypogonadism - Recently started on testosterone. Takes the medication as prescribed without adverse side effects. Reports feeling a small amount of improvement and still continues to experience the associated symptom fatigue.   Allergies  Allergen Reactions  . Sulfa Antibiotics Swelling    Current Outpatient Prescriptions on File Prior to Visit  Medication Sig Dispense Refill  . amLODipine (NORVASC) 10 MG tablet Take 1 tablet (10 mg total) by mouth daily. 30 tablet 2  . metoprolol succinate (TOPROL-XL) 25 MG 24 hr tablet Take 1 tablet (25 mg total) by mouth daily. 90 tablet 3  . testosterone enanthate (DELATESTRYL) 200 MG/ML injection Inject 0.5 mLs (100 mg total) into the muscle every 14 (fourteen) days. For IM use only 5 mL 0   No current facility-administered medications on file prior to visit.    Review of Systems  Constitutional: Negative for fever and chills.  Respiratory: Negative for cough, chest tightness and wheezing.   Cardiovascular: Negative for chest pain, palpitations and leg swelling.  Neurological: Negative for dizziness, weakness and headaches.      Objective:    BP 136/78 mmHg  Pulse 86  Temp(Src) 97.9 F (36.6 C) (Oral)  Resp 18  Ht 6\' 1"  (1.854 m)  Wt 259 lb (117.482 kg)  BMI 34.18 kg/m2  SpO2 97% Nursing note and vital signs reviewed.  Physical Exam  Constitutional: He is oriented to person, place, and time. He appears well-developed and well-nourished. No distress.  Cardiovascular: Normal rate, regular rhythm, normal heart sounds and intact distal pulses.    Pulmonary/Chest: Effort normal and breath sounds normal.  Neurological: He is alert and oriented to person, place, and time.  Skin: Skin is warm and dry.  Psychiatric: He has a normal mood and affect. His behavior is normal. Judgment and thought content normal.       Assessment & Plan:   Problem List Items Addressed This Visit      Endocrine   Hypogonadism in male - Primary    Stable with current dosage and due for testosterone levels in about 1 week. Continue current dosage of testosterone enanthate. Follow up pending lab work.

## 2015-03-27 NOTE — Progress Notes (Signed)
Pre visit review using our clinic review tool, if applicable. No additional management support is needed unless otherwise documented below in the visit note. 

## 2015-03-27 NOTE — Assessment & Plan Note (Signed)
Stable with current dosage and due for testosterone levels in about 1 week. Continue current dosage of testosterone enanthate. Follow up pending lab work.

## 2015-03-27 NOTE — Patient Instructions (Signed)
Thank you for choosing [facility].  Summary/Instructions:  Please continue to take your medication as prescribed.   Please stop by the lab on the basement level of the building for your blood work. Your results will be released to [place] (or called to you) after review, usually within 72 hours after test completion. If any changes need to be made, you will be notified at that same time.  If your symptoms worsen or fail to improve, please contact our office for further instruction, or in case of emergency go directly to the emergency room at the closest medical facility.

## 2015-04-01 ENCOUNTER — Encounter: Payer: Self-pay | Admitting: Internal Medicine

## 2015-04-01 ENCOUNTER — Ambulatory Visit (INDEPENDENT_AMBULATORY_CARE_PROVIDER_SITE_OTHER): Payer: 59 | Admitting: Internal Medicine

## 2015-04-01 VITALS — BP 114/80 | HR 92 | Temp 98.4°F | Resp 16 | Wt 261.0 lb

## 2015-04-01 DIAGNOSIS — G43109 Migraine with aura, not intractable, without status migrainosus: Secondary | ICD-10-CM | POA: Diagnosis not present

## 2015-04-01 MED ORDER — PREDNISONE 50 MG PO TABS: ORAL_TABLET | ORAL | Status: DC

## 2015-04-01 NOTE — Progress Notes (Signed)
Subjective:  Patient ID: Wayne Wise, male    DOB: 1980/06/08  Age: 35 y.o. MRN: [id number]  CC: Headache   HPI Wayne Wise presents for a 2 day hx of bilateral temple and ocular HA that he describes as pressure when he leans forward. He woke up yesterday with no warning prior to the HA onset. He has a long hx of HA's and sees Wayne Wise. He has tried many medications over the years including triptans and multiple other meds with no relief. He has a HA about 2 times per month that resolves after a dose of hydromorphone. His main concern about this HA is that it did not go away after a dose of hydromorphone, motrin, and tylenol. This is not the worst HA he has ever had but it has lasted longer. He reports photo and phonophobia as well as dizziness. He had a LGF this AM of 100.2 according to his wife. He denies rash, N, V, neck pain, or facial pain.  Outpatient Prescriptions Prior to Visit  Medication Sig Dispense Refill  . amLODipine (NORVASC) 10 MG tablet Take 1 tablet (10 mg total) by mouth daily. 30 tablet 2  . metoprolol succinate (TOPROL-XL) 25 MG 24 hr tablet Take 1 tablet (25 mg total) by mouth daily. 90 tablet 3  . testosterone enanthate (DELATESTRYL) 200 MG/ML injection Inject 0.5 mLs (100 mg total) into the muscle every 14 (fourteen) days. For IM use only 5 mL 0   No facility-administered medications prior to visit.    ROS Review of Systems  Constitutional: Negative.  Negative for fever, chills and appetite change.  HENT: Negative for congestion, facial swelling, rhinorrhea, sinus pressure, sore throat, trouble swallowing and voice change.   Eyes: Negative.   Respiratory: Negative.  Negative for cough, choking, chest tightness, shortness of breath and stridor.   Cardiovascular: Negative.  Negative for chest pain, palpitations and leg swelling.  Gastrointestinal: Negative.  Negative for nausea, vomiting, abdominal pain, diarrhea and constipation.  Endocrine: Negative.     Genitourinary: Negative.   Musculoskeletal: Negative.  Negative for back pain, arthralgias and neck pain.  Skin: Negative.  Negative for color change and rash.  Neurological: Positive for dizziness and headaches. Negative for tremors, seizures, speech difficulty, weakness, light-headedness and numbness.  Hematological: Negative.  Negative for adenopathy. Does not bruise/bleed easily.  Psychiatric/Behavioral: Negative.  Negative for sleep disturbance and dysphoric mood. The patient is not nervous/anxious.     Objective:  BP 114/80 mmHg  Pulse 92  Temp(Src) 98.4 F (36.9 C) (Oral)  Resp 16  Wt 261 lb (118.389 kg)  SpO2 98%  BP Readings from Last 3 Encounters:  04/01/15 114/80  03/27/15 136/78  02/15/15 130/78    Wt Readings from Last 3 Encounters:  04/01/15 261 lb (118.389 kg)  03/27/15 259 lb (117.482 kg)  02/15/15 263 lb (119.296 kg)    Physical Exam  Constitutional: He is oriented to person, place, and time. He appears well-developed and well-nourished.  Non-toxic appearance. He does not have a sickly appearance. He does not appear ill. No distress.  HENT:  Mouth/Throat: Oropharynx is clear and moist. No oropharyngeal exudate.  Eyes: Conjunctivae and EOM are normal. Pupils are equal, round, and reactive to light. Right eye exhibits no discharge. Left eye exhibits no discharge. No scleral icterus.  Neck: Normal range of motion and full passive range of motion without pain. Neck supple. No JVD present. No tracheal deviation present. No Brudzinski's sign and no Kernig's sign noted.  No thyroid mass and no thyromegaly present.  Cardiovascular: Normal rate, regular rhythm, normal heart sounds and intact distal pulses.  Exam reveals no friction rub.   No murmur heard. Pulmonary/Chest: Effort normal and breath sounds normal. No stridor. No respiratory distress. He has no wheezes. He has no rales. He exhibits no tenderness.  Abdominal: Soft. Bowel sounds are normal. He exhibits no  distension and no mass. There is no tenderness. There is no rebound and no guarding.  Musculoskeletal: Normal range of motion. He exhibits no edema or tenderness.  Lymphadenopathy:    He has no cervical adenopathy.  Neurological: He is alert and oriented to person, place, and time. He has normal reflexes. He displays normal reflexes. No cranial nerve deficit. He exhibits normal muscle tone. Coordination normal.  Skin: Skin is warm and dry. No rash noted. He is not diaphoretic. No erythema. No pallor.  Psychiatric: He has a normal mood and affect. His behavior is normal. Judgment and thought content normal.  Vitals reviewed.   Lab Results  Component Value Date   WBC 6.4 01/16/2015   HGB 16.0 01/16/2015   HCT 47.5 01/16/2015   PLT 251.0 01/16/2015   GLUCOSE 78 01/12/2014   CHOL 197 01/12/2014   TRIG 164.0* 01/12/2014   HDL 30.30* 01/12/2014   LDLCALC 134* 01/12/2014   NA 137 01/12/2014   K 4.0 01/12/2014   CL 103 01/12/2014   CREATININE 1.0 01/12/2014   BUN 18 01/12/2014   CO2 23 01/12/2014   TSH 2.43 01/16/2015   INR 0.89 12/15/2009   HGBA1C 5.2 01/16/2015    Dg Chest 2 View  08/31/2014  CLINICAL DATA:  Two months of cough and congestion, nonsmoker. EXAM: CHEST  2 VIEW COMPARISON:  None. FINDINGS: The lungs are adequately inflated and clear. The heart and pulmonary vascularity are normal. The mediastinum is normal in width. There is no pleural effusion. The bony thorax exhibits no acute abnormality. IMPRESSION: There is no active cardiopulmonary disease. Electronically Signed   By: David  Martinique M.D.   On: 08/31/2014 11:35    Assessment & Plan:   [name] was seen today for headache.  Diagnoses and all orders for this visit:  Migraine aura without headache- he is not willing to try a triptan or midrin since he tells me that those types of meds have not helped previously, this HA appears to have been precipitated by a mild viral syndrome, will try a 5 day course of steroids to  reduce the pain and treat any blood vessel inflammation, he does not have N or V so I did not prescribe compazine. -     predniSONE (DELTASONE) 50 MG tablet; One po QD for 5 days   I am having Wayne Wise start on predniSONE. I am also having him maintain his amLODipine, metoprolol succinate, and testosterone enanthate.  Meds ordered this encounter  Medications  . predniSONE (DELTASONE) 50 MG tablet    Sig: One po QD for 5 days    Dispense:  5 tablet    Refill:  0     Follow-up: Return in about 1 week (around 04/08/2015).  Scarlette Calico, MD

## 2015-04-01 NOTE — Progress Notes (Signed)
Pre visit review using our clinic review tool, if applicable. No additional management support is needed unless otherwise documented below in the visit note. 

## 2015-04-01 NOTE — Patient Instructions (Signed)

## 2015-04-06 ENCOUNTER — Telehealth: Payer: Self-pay | Admitting: Family

## 2015-04-06 ENCOUNTER — Other Ambulatory Visit (INDEPENDENT_AMBULATORY_CARE_PROVIDER_SITE_OTHER): Payer: 59

## 2015-04-06 DIAGNOSIS — E291 Testicular hypofunction: Secondary | ICD-10-CM

## 2015-04-06 LAB — CBC
HCT: 47.1 % (ref 39.0–52.0)
Hemoglobin: 16 g/dL (ref 13.0–17.0)
MCHC: 34 g/dL (ref 30.0–36.0)
MCV: 91.5 fl (ref 78.0–100.0)
Platelets: 319 10*3/uL (ref 150.0–400.0)
RBC: 5.15 Mil/uL (ref 4.22–5.81)
RDW: 13 % (ref 11.5–15.5)
WBC: 13.7 10*3/uL — AB (ref 4.0–10.5)

## 2015-04-06 LAB — TESTOSTERONE: TESTOSTERONE: 147.65 ng/dL — AB (ref 300.00–890.00)

## 2015-04-06 MED ORDER — TESTOSTERONE ENANTHATE 200 MG/ML IM SOLN: 200.0000 mg | INTRAMUSCULAR | Status: DC

## 2015-04-06 NOTE — Telephone Encounter (Signed)
Please inform patient that his testosterone levels remain low. Therefore please have him increase to 200 mg every 2 weeks. We can follow up for blood work in 2 months.

## 2015-04-12 NOTE — Telephone Encounter (Signed)
Spoke with pt. Told him to increase testosterone injection and come back in 1 month to repeat blood work. Pt aware.

## 2015-04-12 NOTE — Telephone Encounter (Signed)
Patient would like to know if he does not feel good in a few weeks after doing this if injections could be an option?  Also, does patient need to just go to the lab in two months or do we need to schedule an OV?

## 2015-05-05 ENCOUNTER — Telehealth: Payer: Self-pay | Admitting: Family

## 2015-05-05 DIAGNOSIS — I10 Essential (primary) hypertension: Secondary | ICD-10-CM

## 2015-05-05 MED ORDER — AMLODIPINE BESYLATE 10 MG PO TABS: 10.0000 mg | ORAL_TABLET | Freq: Every day | ORAL | Status: DC

## 2015-05-05 NOTE — Telephone Encounter (Signed)
Pt requesting refill for amLODipine (NORVASC) 10 MG tablet [id number] Pharmacy is [facility] on [address].

## 2015-05-05 NOTE — Telephone Encounter (Signed)
Rx sent 

## 2015-05-16 ENCOUNTER — Other Ambulatory Visit (INDEPENDENT_AMBULATORY_CARE_PROVIDER_SITE_OTHER): Payer: 59

## 2015-05-16 ENCOUNTER — Other Ambulatory Visit: Payer: Self-pay | Admitting: Family

## 2015-05-16 DIAGNOSIS — R7989 Other specified abnormal findings of blood chemistry: Secondary | ICD-10-CM

## 2015-05-16 DIAGNOSIS — E291 Testicular hypofunction: Secondary | ICD-10-CM | POA: Diagnosis not present

## 2015-05-16 LAB — CBC
HEMATOCRIT: 51.2 % (ref 39.0–52.0)
HEMOGLOBIN: 17.5 g/dL — AB (ref 13.0–17.0)
MCHC: 34.2 g/dL (ref 30.0–36.0)
MCV: 92.1 fl (ref 78.0–100.0)
PLATELETS: 279 10*3/uL (ref 150.0–400.0)
RBC: 5.55 Mil/uL (ref 4.22–5.81)
RDW: 13.7 % (ref 11.5–15.5)
WBC: 11.2 10*3/uL — ABNORMAL HIGH (ref 4.0–10.5)

## 2015-05-16 LAB — TESTOSTERONE: TESTOSTERONE: 464.06 ng/dL (ref 300.00–890.00)

## 2015-05-17 ENCOUNTER — Telehealth: Payer: Self-pay | Admitting: Family

## 2015-05-17 NOTE — Telephone Encounter (Signed)
Please inform patient that his testosterone levels are within the normal ranges. His hemolglobin and hematocrit are good as well. Therefore he can continue the the current dosage.

## 2015-05-18 ENCOUNTER — Encounter: Payer: Self-pay | Admitting: Pulmonary Disease

## 2015-05-18 ENCOUNTER — Ambulatory Visit (INDEPENDENT_AMBULATORY_CARE_PROVIDER_SITE_OTHER): Payer: 59 | Admitting: Pulmonary Disease

## 2015-05-18 VITALS — BP 126/68 | HR 112 | Ht 72.0 in | Wt 255.0 lb

## 2015-05-18 DIAGNOSIS — G4733 Obstructive sleep apnea (adult) (pediatric): Secondary | ICD-10-CM | POA: Diagnosis not present

## 2015-05-18 DIAGNOSIS — J309 Allergic rhinitis, unspecified: Secondary | ICD-10-CM | POA: Diagnosis not present

## 2015-05-18 DIAGNOSIS — G4710 Hypersomnia, unspecified: Secondary | ICD-10-CM | POA: Insufficient documentation

## 2015-05-18 MED ORDER — FLUTICASONE PROPIONATE 50 MCG/ACT NA SUSP: 2.0000 | Freq: Every day | NASAL | Status: DC

## 2015-05-18 MED ORDER — AZITHROMYCIN 250 MG PO TABS: ORAL_TABLET | ORAL | Status: DC

## 2015-05-18 NOTE — Assessment & Plan Note (Signed)
Rx for Zpak - take if yellow green discharge/ sputum Rx for flonase - 1 spray each nare at bedtime Stay on claritin

## 2015-05-18 NOTE — Assessment & Plan Note (Signed)
Home sleep test- based on results , we will decide about CPAP therapy  Given excessive daytime somnolence, narrow pharyngeal exam, witnessed apneas & loud snoring, obstructive sleep apnea is very likely & an overnight polysomnogram will be scheduled as a home study. The pathophysiology of obstructive sleep apnea , it's cardiovascular consequences & modes of treatment including CPAP were discused with the patient in detail & they evidenced understanding.

## 2015-05-18 NOTE — Telephone Encounter (Signed)
Pt aware of results 

## 2015-05-18 NOTE — Progress Notes (Signed)
Subjective:    Patient ID: Wayne Wise, male    DOB: Aug 31, 1980, 35 y.o.   MRN: [id number]  HPI  Chief Complaint  Patient presents with  . Sleep Consult    Referred by Dr. Elna Breslow; Snoring; waking up several times at night, has a lot of allergy issues during this time of season. wakes up gasping for air some nights.  Epworth Score: 15; Pt had blood work drawn on 4/4, WBC are 49.67.   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 His wife has noted loud snoring, he reports waking up several times during the night and unrefreshing sleep. Epworth sleepiness score is 15 Bedtime is around 10 PM, he sleeps on his back or on his sides with one pillow, reports 4-5 nocturnal awakenings including nocturia and is out of bed at 7:30 feeling exhausted with occasional headaches and dryness of mouth. He is gained about 35 pounds in the last 5 years. He naps for about 2 hours in his chair on weekends There is no history suggestive of cataplexy, sleep paralysis or parasomnias He reports history of traumatic brain injury in 2011 and chronic headaches related to this for which he takes Dilaudid once or twice a month. Headaches are worse with sunlight exposure.  He drinks lots of tea daily and juice especially around bedtime, denies coffee or carbonated beverages. He reports pollen allergies and he is just started using Flonase and Claritin for the last 2 days. He reports purulent nasal discharge  His dad has OSA and had good results with CPAP Past Medical History  Diagnosis Date  . TBI (traumatic brain injury) ([place])   . Migraines   . Chicken pox   . 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 Kidney stones   . Arthritis      Past Surgical History  Procedure Laterality Date  . Fracture surgery Left 2012    Allergies  Allergen Reactions  . Sulfa Antibiotics Swelling    Social History   Social History  . Marital Status: Married    Spouse Name: N/A  . Number of  Children: 1  . Years of Education: 12   Occupational History  . Deputy [employer]    Social History Main Topics  . Smoking status: Never Smoker   . Smokeless tobacco: Former Systems developer  . Alcohol Use: Yes     Comment: occasionally  . Drug Use: No  . Sexual Activity: Not on file   Other Topics Concern  . Not on file   Social History Narrative   Born and raised in [place], [place]. Currently resides in a private residence wife and son. 1 cat (farm outside). Fun: Likes to hunt.    Denies religious beliefs that effect healthcare.      Family History  Problem Relation Age of Onset  . Arthritis Mother   . Arthritis Maternal Grandmother   . Breast cancer Maternal Grandmother   . Diabetes Maternal Grandmother   . Stroke Maternal Grandfather   . Diabetes Maternal Grandfather   . Breast cancer Paternal Grandmother      Social History   Social History  . Marital Status: Married    Spouse Name: N/A  . Number of Children: 1  . Years of Education: 12   Occupational History  . Deputy [employer]    Social History Main Topics  . Smoking status: Never Smoker   . Smokeless tobacco: Former Systems developer  . Alcohol Use: Yes     Comment: occasionally  .  Drug Use: No  . Sexual Activity: Not on file   Other Topics Concern  . Not on file   Social History Narrative   Born and raised in [place], [place]. Currently resides in a private residence wife and son. 1 cat (farm outside). Fun: Likes to hunt.    Denies religious beliefs that effect healthcare.     Review of Systems neg for any significant sore throat, dysphagia, itching, sneezing, nasal congestion or excess/ purulent secretions, fever, chills, sweats, unintended wt loss, pleuritic or exertional cp, hempoptysis, orthopnea pnd or change in chronic leg swelling.  Also denies presyncope, palpitations, heartburn, abdominal pain, nausea, vomiting, diarrhea or change in bowel or urinary habits, dysuria,hematuria, rash, arthralgias, visual complaints,  headache, numbness weakness or ataxia.     Objective:   Physical Exam  Gen. Pleasant, obese, in no distress ENT - no lesions, no post nasal drip Neck: No JVD, no thyromegaly, no carotid bruits Lungs: no use of accessory muscles, no dullness to percussion, decreased without rales or rhonchi  Cardiovascular: Rhythm regular, heart sounds  normal, no murmurs or gallops, no peripheral edema Musculoskeletal: No deformities, no cyanosis or clubbing , no tremors       Assessment & Plan:

## 2015-05-18 NOTE — Patient Instructions (Signed)
Home sleep test- based on results , we will decide about CPAP therapy Rx for Zpak - take if yellow green discharge/ sputum Rx for flonase - 1 spray each nare at bedtime Stay on claritin

## 2015-05-23 DIAGNOSIS — G4733 Obstructive sleep apnea (adult) (pediatric): Secondary | ICD-10-CM | POA: Diagnosis not present

## 2015-05-29 ENCOUNTER — Telehealth: Payer: Self-pay | Admitting: Pulmonary Disease

## 2015-05-29 DIAGNOSIS — G4733 Obstructive sleep apnea (adult) (pediatric): Secondary | ICD-10-CM

## 2015-05-29 NOTE — Telephone Encounter (Signed)
Patient calling to get results of Sleep Study. Patient did sleep study 1 week ago.   Dr. Elsworth Soho, please advise.  (Pt aware that Dr. Elsworth Soho is not in office until Wednesday)

## 2015-05-31 NOTE — Telephone Encounter (Signed)
Pt returning call to get results of sleep test.Wayne Wise

## 2015-05-31 NOTE — Telephone Encounter (Signed)
He has severe OSA- AHI 77/hour Needs CPAP titration study -based on this we will start him on CPAP therapy

## 2015-05-31 NOTE — Telephone Encounter (Signed)
RA please advise on sleep study results.  

## 2015-05-31 NOTE — Telephone Encounter (Signed)
Patient notified of Sleep Study results. CPAP Titration study ordered. Nothing further needed.

## 2015-06-01 ENCOUNTER — Telehealth: Payer: Self-pay | Admitting: Pulmonary Disease

## 2015-06-01 DIAGNOSIS — G4733 Obstructive sleep apnea (adult) (pediatric): Secondary | ICD-10-CM

## 2015-06-01 NOTE — Telephone Encounter (Signed)
Spoke with pt's wife. States that she was calling us to find out what needing to be done about his CPAP set up. Before we called her back, [facility]PS called the pt to let him that they are working on getting this taken care of for him. Another CMA documented that the order was placed but she did not call the pt of his wife to let them know that this was being handled. I apologized for this and asked her to let us know if there was anything else we could help them with.

## 2015-06-01 NOTE — Telephone Encounter (Signed)
Pl send Rx for auto CPAP 5-15 cm Download in 2 wks & OV Machine can be fine tuned after cpap titration

## 2015-06-01 NOTE — Telephone Encounter (Signed)
Pt wife Caryl Pina returned call, CB [phone].

## 2015-06-01 NOTE — Telephone Encounter (Signed)
LMTC x 1 for Caryl Pina

## 2015-06-01 NOTE — Telephone Encounter (Signed)
Rx sent for CPAP machine - STAT ORDER. Nothing further needed.

## 2015-06-01 NOTE — Telephone Encounter (Signed)
Called spoke with pt's wife. She states that he pt was taken to [facility] last night due to his sleep apnea. She states he stopped breathing and she was unable to get him to respond to her. She states that his CPAP titration is scheduled for 07/20/15 but she states the test needs to be done before then or he needs to be placed on a CPAP ASAP. I explained to her that I would make RA aware of situation and get his recs. She voiced understanding and had no further questions.   I spoke with Dawn in [facility] she states that she placed the patient on the cancellation list for the CPAP titration and she called Forestine Na to see if he could be worked in sooner but Forestine Na is scheduling goes out until the end of May 2017.  I spoke with Golden Circle and she states she started the precert on [id number] with [facility] and states we should receive a answer in the next 24 hours.   RA please advise

## 2015-06-02 ENCOUNTER — Other Ambulatory Visit: Payer: Self-pay | Admitting: *Deleted

## 2015-06-02 DIAGNOSIS — G4733 Obstructive sleep apnea (adult) (pediatric): Secondary | ICD-10-CM

## 2015-06-19 ENCOUNTER — Encounter: Payer: Self-pay | Admitting: Family

## 2015-06-19 ENCOUNTER — Telehealth: Payer: Self-pay | Admitting: *Deleted

## 2015-06-19 ENCOUNTER — Ambulatory Visit (INDEPENDENT_AMBULATORY_CARE_PROVIDER_SITE_OTHER): Payer: 59 | Admitting: Family

## 2015-06-19 ENCOUNTER — Telehealth: Payer: Self-pay

## 2015-06-19 VITALS — BP 122/80 | HR 108 | Temp 98.1°F | Resp 18 | Ht 72.0 in | Wt 271.0 lb

## 2015-06-19 DIAGNOSIS — G4733 Obstructive sleep apnea (adult) (pediatric): Secondary | ICD-10-CM | POA: Diagnosis not present

## 2015-06-19 DIAGNOSIS — M10079 Idiopathic gout, unspecified ankle and foot: Secondary | ICD-10-CM | POA: Diagnosis not present

## 2015-06-19 DIAGNOSIS — E291 Testicular hypofunction: Secondary | ICD-10-CM

## 2015-06-19 DIAGNOSIS — M109 Gout, unspecified: Secondary | ICD-10-CM | POA: Insufficient documentation

## 2015-06-19 MED ORDER — DICLOFENAC SODIUM 75 MG PO TBEC: 75.0000 mg | DELAYED_RELEASE_TABLET | Freq: Two times a day (BID) | ORAL | Status: DC

## 2015-06-19 MED ORDER — TESTOSTERONE ENANTHATE 200 MG/ML IM SOLN: 200.0000 mg | INTRAMUSCULAR | Status: DC

## 2015-06-19 NOTE — Progress Notes (Signed)
Subjective:    Patient ID: Wayne Wise, male    DOB: Dec 29, 1980, 35 y.o.   MRN: [id number]  Chief Complaint  Patient presents with  . Medication Refill    would like a refill of diclofenac for gout    HPI:  Wayne Wise is a 35 y.o. male who  has a past medical history of TBI (traumatic brain injury) (Wayne Wise); Migraines; Chicken pox; Headaches due to old head trauma; Kidney stones; and Arthritis. and presents today for a follow up office visit.   1.) Gout - This is a new problem. Previously diagnosed with gout with the associated symptom of pain of flares located in his heels and great toes. Endorses increased gout flare over the past several weeks which he attributes to his diet. Modifying factors include diclofenac which he reports adequately controls his symptoms. Not currently experiencing any gout related pain. No recent blood work. Requesting refill of medication.   2.) Sleep apnea - Recently started on CPAP and notes significant improvement in his sleep and fatigue. Reports using the CPAP on a nightly basis without complications.    Allergies  Allergen Reactions  . Sulfa Antibiotics Swelling     Current Outpatient Prescriptions on File Prior to Visit  Medication Sig Dispense Refill  . amLODipine (NORVASC) 10 MG tablet Take 1 tablet (10 mg total) by mouth daily. 30 tablet 2  . fluticasone (FLONASE) 50 MCG/ACT nasal spray Place 2 sprays into both nostrils daily. 16 g 5  . loratadine (CLARITIN) 10 MG tablet Take 10 mg by mouth daily.    . metoprolol succinate (TOPROL-XL) 25 MG 24 hr tablet Take 1 tablet (25 mg total) by mouth daily. 90 tablet 3   No current facility-administered medications on file prior to visit.    Past Medical History  Diagnosis Date  . TBI (traumatic brain injury) ([place])   . Migraines   . Chicken pox   . 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 Kidney stones   . Arthritis      Review of Systems  Constitutional: Negative for fever and chills.    Respiratory: Negative for chest tightness and shortness of breath.   Cardiovascular: Negative for chest pain, palpitations and leg swelling.      Objective:    BP 122/80 mmHg  Pulse 108  Temp(Src) 98.1 F (36.7 C) (Oral)  Resp 18  Ht 6' (1.829 m)  Wt 271 lb (122.925 kg)  BMI 36.75 kg/m2  SpO2 96% Nursing note and vital signs reviewed.  Physical Exam  Constitutional: He is oriented to person, place, and time. He appears well-developed and well-nourished. No distress.  Cardiovascular: Normal rate, regular rhythm, normal heart sounds and intact distal pulses.   Pulmonary/Chest: Effort normal and breath sounds normal.  Musculoskeletal:  Bilateral feet - no obvious deformity, discoloration, or edema noted. No palpable tenderness able to be elicited. Range of motion is normal of the ankle and toes. Distal pulses and sensation are intact and appropriate.  Neurological: He is alert and oriented to person, place, and time.  Skin: Skin is warm and dry.  Psychiatric: He has a normal mood and affect. His behavior is normal. Judgment and thought content normal.       Assessment & Plan:   Problem List Items Addressed This Visit      Respiratory   OSA (obstructive sleep apnea)    Stable and improved symptoms of fatigue and decreased sleepiness with current CPAP. Continue current CPAP settings.  Management per pulmonology.        Other   Gout - Primary    Previously diagnosed gout flares with increased frequency and managed with diclofenac. Restart diclofenac as needed for flares. Declines uric acid blood work today. Follow-up if symptom frequency increases or is no longer controlled with current medication regimen.      Relevant Medications   diclofenac (VOLTAREN) 75 MG EC tablet       I have discontinued Wayne Wise's azithromycin. I am also having him start on diclofenac. Additionally, I am having him maintain his metoprolol succinate, amLODipine, loratadine, and  fluticasone.   Meds ordered this encounter  Medications  . diclofenac (VOLTAREN) 75 MG EC tablet    Sig: Take 1 tablet (75 mg total) by mouth 2 (two) times daily.    Dispense:  30 tablet    Refill:  1    Order Specific Question:  Supervising Provider    Answer:  Pricilla Holm A [id number]     Follow-up: Return in about 2 months (around 08/19/2015).  Mauricio Po, FNP

## 2015-06-19 NOTE — Assessment & Plan Note (Signed)
Previously diagnosed gout flares with increased frequency and managed with diclofenac. Restart diclofenac as needed for flares. Declines uric acid blood work today. Follow-up if symptom frequency increases or is no longer controlled with current medication regimen.

## 2015-06-19 NOTE — Assessment & Plan Note (Signed)
Stable and improved symptoms of fatigue and decreased sleepiness with current CPAP. Continue current CPAP settings. Management per pulmonology.

## 2015-06-19 NOTE — Telephone Encounter (Signed)
Left msg on triage stating saw Wayne Wise earlier forgot to ask for refill on his Testosterone...Wayne Wise

## 2015-06-19 NOTE — Telephone Encounter (Signed)
Error patient made a app no message needed

## 2015-06-19 NOTE — Telephone Encounter (Signed)
Done

## 2015-06-19 NOTE — Progress Notes (Signed)
Pre visit review using our clinic review tool, if applicable. No additional management support is needed unless otherwise documented below in the visit note. 

## 2015-06-19 NOTE — Patient Instructions (Signed)
Thank you for choosing [facility].  Summary/Instructions:  Please continue to take your medications as prescribed.   Your prescription(s) have been submitted to your pharmacy or been printed and provided for you. Please take as directed and contact our office if you believe you are having problem(s) with the medication(s) or have any questions.  If your symptoms worsen or fail to improve, please contact our office for further instruction, or in case of emergency go directly to the emergency room at the closest medical facility.     

## 2015-06-20 NOTE — Telephone Encounter (Signed)
Called pt no answer LMOM rx was approved has been faxed to [facility]...Wayne Wise

## 2015-07-20 ENCOUNTER — Ambulatory Visit (HOSPITAL_BASED_OUTPATIENT_CLINIC_OR_DEPARTMENT_OTHER): Payer: 59 | Attending: Pulmonary Disease | Admitting: Pulmonary Disease

## 2015-07-20 VITALS — Ht 72.0 in | Wt 255.0 lb

## 2015-07-20 DIAGNOSIS — G4733 Obstructive sleep apnea (adult) (pediatric): Secondary | ICD-10-CM | POA: Diagnosis present

## 2015-07-20 DIAGNOSIS — R0683 Snoring: Secondary | ICD-10-CM | POA: Insufficient documentation

## 2015-07-24 ENCOUNTER — Other Ambulatory Visit (HOSPITAL_BASED_OUTPATIENT_CLINIC_OR_DEPARTMENT_OTHER): Payer: Self-pay

## 2015-07-24 DIAGNOSIS — G4733 Obstructive sleep apnea (adult) (pediatric): Secondary | ICD-10-CM

## 2015-07-27 ENCOUNTER — Telehealth: Payer: Self-pay | Admitting: Pulmonary Disease

## 2015-07-27 NOTE — Telephone Encounter (Signed)
Pt is requesting results of his most recent sleep study.  RA - please advise. Thanks.

## 2015-07-31 ENCOUNTER — Telehealth: Payer: Self-pay | Admitting: Family

## 2015-07-31 DIAGNOSIS — E291 Testicular hypofunction: Secondary | ICD-10-CM

## 2015-07-31 DIAGNOSIS — G4730 Sleep apnea, unspecified: Secondary | ICD-10-CM | POA: Diagnosis not present

## 2015-07-31 NOTE — Telephone Encounter (Signed)
Pt called in and needs his Testerone refilled and would like more refills on the script if he could so he does not have to call in every month

## 2015-07-31 NOTE — Telephone Encounter (Signed)
OSA was corrected by CPAP of 13 cm He should be on auto CPAP which is acceptable Needs follow-up visit to review with download

## 2015-07-31 NOTE — Procedures (Signed)
Patient Name: Wayne Wise, Durr Date: 07/20/2015 Gender: Male D.O.B: 09-01-1980 Age (years): 34 Referring Provider: Kara Mead MD, ABSM Height (inches): 72 Interpreting Physician: Kara Mead MD, ABSM Weight (lbs): 255 RPSGT: Gerhard Perches BMI: 35 MRN: [id number] Neck Size: 17.50   CLINICAL INFORMATION The patient is referred for a CPAP titration to treat sleep apnea. Date of  HST: 05/2015 severe OSA- AHI 77/hour   SLEEP STUDY TECHNIQUE As per the AASM Manual for the Scoring of Sleep and Associated Events v2.3 (April 2016) with a hypopnea requiring 4% desaturations. The channels recorded and monitored were frontal, central and occipital EEG, electrooculogram (EOG), submentalis EMG (chin), nasal and oral airflow, thoracic and abdominal wall motion, anterior tibialis EMG, snore microphone, electrocardiogram, and pulse oximetry. Continuous positive airway pressure (CPAP) was initiated at the beginning of the study and titrated to treat sleep-disordered breathing.   RESPIRATORY PARAMETERS Optimal PAP Pressure (cm): 13 AHI at Optimal Pressure (/hr): 0.0 Overall Minimal O2 (%): 91.00 Supine % at Optimal Pressure (%): 100 Minimal O2 at Optimal Pressure (%): 94.0     SLEEP ARCHITECTURE The study was initiated at 10:57:52 PM and ended at 5:01:53 AM. Sleep onset time was 15.6 minutes and the sleep efficiency was 83.0%. The total sleep time was 302.0 minutes. The patient spent 1.16% of the night in stage N1 sleep, 81.13% in stage N2 sleep, 0.66% in stage N3 and 17.05% in REM.Stage REM latency was 121.0 minutes Wake after sleep onset was 46.4. Alpha intrusion was absent. Supine sleep was 54.14%.   CARDIAC DATA The 2 lead EKG demonstrated sinus rhythm. The mean heart rate was 72.00 beats per minute. Other EKG findings include: None.   LEG MOVEMENT DATA The total Periodic Limb Movements of Sleep (PLMS) were 0. The PLMS index was 0.00. A PLMS index of <15 is considered normal in  adults.   IMPRESSIONS - The optimal PAP pressure was 13 cm of water. - Central sleep apnea was not noted during this titration (CAI = 0.0/h). - Significant oxygen desaturations were not observed during this titration (min O2 = 91.00%). - The patient snored with Moderate snoring volume during this titration study. - No cardiac abnormalities were observed during this study. - Clinically significant periodic limb movements were not noted during this study. Arousals associated with PLMs were rare.   DIAGNOSIS - Obstructive Sleep Apnea (327.23 [G47.33 ICD-10])   RECOMMENDATIONS - Trial of CPAP therapy on 13 cm H2O with a Large size Resmed Nasal Pillow Mask AirFit P10 mask and heated humidification. - Avoid alcohol, sedatives and other CNS depressants that may worsen sleep apnea and disrupt normal sleep architecture. - Sleep hygiene should be reviewed to assess factors that may improve sleep quality. - Weight management and regular exercise should be initiated or continued. - Return to Sleep Center for re-evaluation after 4 weeks of therapy  Kara Mead MD. FCCP. [facility]   07/31/2015

## 2015-08-01 MED ORDER — TESTOSTERONE ENANTHATE 200 MG/ML IM SOLN: 300.0000 mg | INTRAMUSCULAR | Status: DC

## 2015-08-01 NOTE — Telephone Encounter (Signed)
Spoke with pt and advised of results of titration study and RA's recommendations. Pt did not have follow up scheduled so appt made with SG 08/17/15 for DL review. Nothing further needed.

## 2015-08-01 NOTE — Telephone Encounter (Signed)
Last refill was 06/19/15

## 2015-08-01 NOTE — Telephone Encounter (Signed)
Patient called back regarding sleep study results - he would like a call back at [phone]

## 2015-08-01 NOTE — Telephone Encounter (Signed)
Medication with refills printed to be faxed.

## 2015-08-14 ENCOUNTER — Other Ambulatory Visit: Payer: Self-pay

## 2015-08-14 DIAGNOSIS — I10 Essential (primary) hypertension: Secondary | ICD-10-CM

## 2015-08-14 MED ORDER — AMLODIPINE BESYLATE 10 MG PO TABS: 10.0000 mg | ORAL_TABLET | Freq: Every day | ORAL | Status: DC

## 2015-08-17 ENCOUNTER — Ambulatory Visit: Payer: Self-pay | Admitting: Acute Care

## 2015-10-02 ENCOUNTER — Encounter: Payer: Self-pay | Admitting: Acute Care

## 2015-10-02 ENCOUNTER — Ambulatory Visit (INDEPENDENT_AMBULATORY_CARE_PROVIDER_SITE_OTHER): Payer: 59 | Admitting: Acute Care

## 2015-10-02 VITALS — BP 130/82 | HR 83 | Temp 98.3°F | Ht 72.0 in | Wt 250.0 lb

## 2015-10-02 DIAGNOSIS — G4733 Obstructive sleep apnea (adult) (pediatric): Secondary | ICD-10-CM

## 2015-10-02 NOTE — Patient Instructions (Addendum)
It is nice to meet you today. Your Apnea events are 0.4 per hour. You are benefiting from treatment. Keep up the great work. We will request that your have supplies delivered. Continue on CPAP at bedtime. You appear to be benefiting from the treatment Goal is to wear for at least 4-6 hours each night for maximal clinical benefit. Continue to work on weight loss, as the link between excess weight  and sleep apnea is well established.  Do not drive if sleepy. Avoid alcohol, sedatives and other CNS depressants that may worsen sleep apnea and disrupt normal sleep architecture. Follow up with Dr. Elsworth Soho In 6 months  or before as needed.  Please contact office for sooner follow up if symptoms do not improve or worsen or seek emergency care

## 2015-10-02 NOTE — Progress Notes (Signed)
History of Present Illness Wayne Wise is a 35 y.o. male with severe OSA ( AHI= 77/hr), started on CPAP 07/2015 , followed by Dr. Elsworth Soho.   10/02/2015 Follow Up Appointment for CPAP use after recent initiation of treatment. Pt. States he has been compliant with his treatment. He has worn his  mask 100% of the time. His average usage is 9 hours each night He is having no issues with his mask or leaks, but feels the machine is taking too long to ramp up...stays at a 4 and feels like he is suffocating. Machine setting are Auto Set 5-15 cm H2O. We will place an order to have the machine checked, and for supplies to be ordered.. Pt. States he is less sleepy during the day, and sleeps well with the device each night AHI is reduced from 77 pre treatment to 0.7 with CPAP.Marland Kitchen     Tests OSA:  05/2015 severe OSA- AHI 77/hour  IMPRESSIONS - The optimal PAP pressure was 13 cm of water. - Central sleep apnea was not noted during this titration (CAI = 0.0/h). - Significant oxygen desaturations were not observed during this titration (min O2 = 91.00%). - The patient snored with Moderate snoring volume during this titration study. - No cardiac abnormalities were observed during this study. - Clinically significant periodic limb movements were not noted during this study. Arousals associated with PLMs were rare.  RESPIRATORY PARAMETERS Optimal PAP Pressure (cm):            13                  AHI at Optimal Pressure (/hr):                      0.0 Overall Minimal O2 (%):                   91.00             Supine % at Optimal Pressure (%):              100 Minimal O2 at Optimal Pressure (%):           94.0                                         RECOMMENDATIONS - Trial of CPAP therapy on 13 cm H2O with a Large size Resmed Nasal Pillow Mask AirFit P10 mask and heated humidification. - Avoid alcohol, sedatives and other CNS depressants that may worsen sleep apnea and disrupt normal sleep architecture. -  Sleep hygiene should be reviewed to assess factors that may improve sleep quality. - Weight management and regular exercise should be initiated or continued. - Return to Sleep Center for re-evaluation after 4 weeks of therapy  10/02/2015: Down Load: 08/30/2015-09/28/2015  Auto Set 5/15 cm H2O  Usage Days: 30/30  > 4 hours: 30 days ( 100%)  Usage Hours: 9 hours 1 minute.  Median Pressure : 9.1 cm H2O  AHI=0.7  Past medical hx Past Medical History:  Diagnosis Date  . Arthritis   . Chicken pox   . 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 Kidney stones   . Migraines   . TBI (traumatic brain injury) [facility])      Past surgical hx, Family hx, Social hx all reviewed.  Current Outpatient Prescriptions on File Prior  to Visit  Medication Sig  . amLODipine (NORVASC) 10 MG tablet Take 1 tablet (10 mg total) by mouth daily.  . metoprolol succinate (TOPROL-XL) 25 MG 24 hr tablet Take 1 tablet (25 mg total) by mouth daily.  Marland Kitchen testosterone enanthate (DELATESTRYL) 200 MG/ML injection Inject 1.5 mLs (300 mg total) into the muscle every 14 (fourteen) days. For IM use only  . fluticasone (FLONASE) 50 MCG/ACT nasal spray Place 2 sprays into both nostrils daily. (Patient not taking: Reported on 10/02/2015)  . loratadine (CLARITIN) 10 MG tablet Take 10 mg by mouth daily.   No current facility-administered medications on file prior to visit.      Allergies  Allergen Reactions  . Sulfa Antibiotics Swelling    Review Of Systems:  Constitutional:   No  weight loss, night sweats,  Fevers, chills, fatigue, or  lassitude.  HEENT:   No headaches,  Difficulty swallowing,  Tooth/dental problems, or  Sore throat,                No sneezing, itching, ear ache, nasal congestion, post nasal drip,   CV:  No chest pain,  Orthopnea, PND, swelling in lower extremities, anasarca, dizziness, palpitations, syncope.   GI  No heartburn, indigestion, abdominal pain, nausea, vomiting, diarrhea, change in bowel habits, loss  of appetite, bloody stools.   Resp: No shortness of breath with exertion or at rest.  No excess mucus, no productive cough,  No non-productive cough,  No coughing up of blood.  No change in color of mucus.  No wheezing.  No chest wall deformity  Skin: no rash or lesions.  GU: no dysuria, change in color of urine, no urgency or frequency.  No flank pain, no hematuria   MS:  No joint pain or swelling.  No decreased range of motion.  No back pain.  Psych:  No change in mood or affect. No depression or anxiety.  No memory loss.   Vital Signs BP 130/82 (BP Location: Left Arm, Cuff Size: Normal)   Pulse 83   Temp 98.3 F (36.8 C) (Oral)   Ht 6' (1.829 m)   Wt 250 lb (113.4 kg)   SpO2 96%   BMI 33.91 kg/m    Physical Exam:  General- No distress,  A&Ox3, overweight ENT: No sinus tenderness, TM clear, pale nasal mucosa, no oral exudate,no post nasal drip, no LAN Cardiac: S1, S2, regular rate and rhythm, no murmur Chest: No wheeze/ rales/ dullness; no accessory muscle use, no nasal flaring, no sternal retractions Abd.: Soft Non-tender Ext: No clubbing cyanosis, edema Neuro:  normal strength Skin: No rashes, warm and dry Psych: normal mood and behavior   Assessment/Plan  OSA (obstructive sleep apnea) OSA: Plan: Your Apnea events are 0.4 per hour. You are benefiting from treatment. Keep up the great work. We will request that your have supplies delivered. Continue on CPAP at bedtime. You appear to be benefiting from the treatment Goal is to wear for at least 4-6 hours each night for maximal clinical benefit. Continue to work on weight loss, as the link between excess weight  and sleep apnea is well established.  Do not drive if sleepy. Avoid alcohol, sedatives and other CNS depressants that may worsen sleep apnea and disrupt normal sleep architecture. Follow up with Dr. Elsworth Soho In 6 months  or before as needed.  Please contact office for sooner follow up if symptoms do not  improve or worsen or seek emergency care  Magdalen Spatz, NP 10/02/2015  12:54 PM

## 2015-10-02 NOTE — Assessment & Plan Note (Addendum)
OSA: Plan: Your Apnea events are 0.4 per hour. You are benefiting from treatment. Keep up the great work. We will request that your have supplies delivered. Continue on CPAP at bedtime. You appear to be benefiting from the treatment Goal is to wear for at least 4-6 hours each night for maximal clinical benefit. Continue to work on weight loss, as the link between excess weight  and sleep apnea is well established.  Do not drive if sleepy. Avoid alcohol, sedatives and other CNS depressants that may worsen sleep apnea and disrupt normal sleep architecture. Follow up with Dr. Elsworth Soho In 6 months  or before as needed.  Please contact office for sooner follow up if symptoms do not improve or worsen or seek emergency care

## 2015-10-30 ENCOUNTER — Telehealth: Payer: Self-pay | Admitting: Acute Care

## 2015-10-30 NOTE — Telephone Encounter (Signed)
The pt's last OV note has been faxed to [phone]. I have left a message with Romelle Starcher making her aware of this. Nothing further was needed.

## 2015-11-18 ENCOUNTER — Other Ambulatory Visit: Payer: Self-pay | Admitting: Family

## 2015-11-18 DIAGNOSIS — I10 Essential (primary) hypertension: Secondary | ICD-10-CM

## 2016-01-31 ENCOUNTER — Other Ambulatory Visit: Payer: Self-pay | Admitting: Family

## 2016-02-28 DIAGNOSIS — G4733 Obstructive sleep apnea (adult) (pediatric): Secondary | ICD-10-CM | POA: Diagnosis not present

## 2016-03-03 DIAGNOSIS — G4733 Obstructive sleep apnea (adult) (pediatric): Secondary | ICD-10-CM | POA: Diagnosis not present

## 2016-04-08 ENCOUNTER — Other Ambulatory Visit: Payer: Self-pay | Admitting: Family

## 2016-04-08 DIAGNOSIS — I10 Essential (primary) hypertension: Secondary | ICD-10-CM

## 2016-05-02 DIAGNOSIS — G4733 Obstructive sleep apnea (adult) (pediatric): Secondary | ICD-10-CM | POA: Diagnosis not present

## 2016-05-27 ENCOUNTER — Telehealth: Payer: Self-pay | Admitting: Family

## 2016-05-27 DIAGNOSIS — I10 Essential (primary) hypertension: Secondary | ICD-10-CM

## 2016-05-27 MED ORDER — AMLODIPINE BESYLATE 10 MG PO TABS: 10.0000 mg | ORAL_TABLET | Freq: Every day | ORAL | 0 refills | Status: DC

## 2016-05-27 NOTE — Telephone Encounter (Signed)
Rx sent 

## 2016-05-27 NOTE — Telephone Encounter (Signed)
Patient scheduled appt for CPE end of month.  He is requesting blood pressure med to be sent to [facility].

## 2016-06-03 DIAGNOSIS — G4733 Obstructive sleep apnea (adult) (pediatric): Secondary | ICD-10-CM | POA: Diagnosis not present

## 2016-06-10 ENCOUNTER — Encounter: Payer: 59 | Admitting: Family

## 2016-06-20 ENCOUNTER — Encounter: Payer: Self-pay | Admitting: Family

## 2016-06-20 ENCOUNTER — Ambulatory Visit (INDEPENDENT_AMBULATORY_CARE_PROVIDER_SITE_OTHER): Payer: 59 | Admitting: Family

## 2016-06-20 VITALS — BP 126/70 | HR 86 | Temp 98.3°F | Resp 16 | Ht 72.0 in | Wt 268.0 lb

## 2016-06-20 DIAGNOSIS — E291 Testicular hypofunction: Secondary | ICD-10-CM

## 2016-06-20 DIAGNOSIS — I10 Essential (primary) hypertension: Secondary | ICD-10-CM

## 2016-06-20 DIAGNOSIS — Z0000 Encounter for general adult medical examination without abnormal findings: Secondary | ICD-10-CM

## 2016-06-20 DIAGNOSIS — G4733 Obstructive sleep apnea (adult) (pediatric): Secondary | ICD-10-CM

## 2016-06-20 LAB — LIPID PANEL
CHOLESTEROL: 190 mg/dL (ref 0–200)
HDL: 34 mg/dL — AB (ref 35–70)

## 2016-06-20 LAB — BASIC METABOLIC PANEL: Glucose: 130 mg/dL

## 2016-06-20 MED ORDER — AMLODIPINE BESYLATE 10 MG PO TABS: 10.0000 mg | ORAL_TABLET | Freq: Every day | ORAL | 3 refills | Status: DC

## 2016-06-20 MED ORDER — METOPROLOL SUCCINATE ER 25 MG PO TB24: 25.0000 mg | ORAL_TABLET | Freq: Every day | ORAL | 3 refills | Status: DC

## 2016-06-20 NOTE — Patient Instructions (Addendum)
Thank you for choosing [facility].  SUMMARY AND INSTRUCTIONS:  Continue to take your medication as prescribed.   Medication:  Your prescription(s) have been submitted to your pharmacy or been printed and provided for you. Please take as directed and contact our office if you believe you are having problem(s) with the medication(s) or have any questions.  Follow up:  If your symptoms worsen or fail to improve, please contact our office for further instruction, or in case of emergency go directly to the emergency room at the closest medical facility.     Health Maintenance, Male A healthy lifestyle and preventive care is important for your health and wellness. Ask your health care provider about what schedule of regular examinations is right for you. What should I know about weight and diet?  Eat a Healthy Diet  Eat plenty of vegetables, fruits, whole grains, low-fat dairy products, and lean protein.  Do not eat a lot of foods high in solid fats, added sugars, or salt. Maintain a Healthy Weight  Regular exercise can help you achieve or maintain a healthy weight. You should:  Do at least 150 minutes of exercise each week. The exercise should increase your heart rate and make you sweat (moderate-intensity exercise).  Do strength-training exercises at least twice a week. Watch Your Levels of Cholesterol and Blood Lipids  Have your blood tested for lipids and cholesterol every 5 years starting at 38 years of age. If you are at high risk for heart disease, you should start having your blood tested when you are 38 years old. You may need to have your cholesterol levels checked more often if:  Your lipid or cholesterol levels are high.  You are older than 38 years of age.  You are at high risk for heart disease. What should I know about cancer screening? Many types of cancers can be detected early and may often be prevented. Lung Cancer  You should be screened every year for  lung cancer if:  You are a current smoker who has smoked for at least 30 years.  You are a former smoker who has quit within the past 15 years.  Talk to your health care provider about your screening options, when you should start screening, and how often you should be screened. Colorectal Cancer  Routine colorectal cancer screening usually begins at 38 years of age and should be repeated every 5-10 years until you are 38 years old. You may need to be screened more often if early forms of precancerous polyps or small growths are found. Your health care provider may recommend screening at an earlier age if you have risk factors for colon cancer.  Your health care provider may recommend using home test kits to check for hidden blood in the stool.  A small camera at the end of a tube can be used to examine your colon (sigmoidoscopy or colonoscopy). This checks for the earliest forms of colorectal cancer. Prostate and Testicular Cancer  Depending on your age and overall health, your health care provider may do certain tests to screen for prostate and testicular cancer.  Talk to your health care provider about any symptoms or concerns you have about testicular or prostate cancer. Skin Cancer  Check your skin from head to toe regularly.  Tell your health care provider about any new moles or changes in moles, especially if:  There is a change in a mole's size, shape, or color.  You have a mole that is larger than  a pencil eraser.  Always use sunscreen. Apply sunscreen liberally and repeat throughout the day.  Protect yourself by wearing long sleeves, pants, a wide-brimmed hat, and sunglasses when outside. What should I know about heart disease, diabetes, and high blood pressure?  If you are 13-65 years of age, have your blood pressure checked every 3-5 years. If you are 60 years of age or older, have your blood pressure checked every year. You should have your blood pressure measured  twice-once when you are at a hospital or clinic, and once when you are not at a hospital or clinic. Record the average of the two measurements. To check your blood pressure when you are not at a hospital or clinic, you can use:  An automated blood pressure machine at a pharmacy.  A home blood pressure monitor.  Talk to your health care provider about your target blood pressure.  If you are between 74-54 years old, ask your health care provider if you should take aspirin to prevent heart disease.  Have regular diabetes screenings by checking your fasting blood sugar level.  If you are at a normal weight and have a low risk for diabetes, have this test once every three years after the age of 26.  If you are overweight and have a high risk for diabetes, consider being tested at a younger age or more often.  A one-time screening for abdominal aortic aneurysm (AAA) by ultrasound is recommended for men aged 33-75 years who are current or former smokers. What should I know about preventing infection? Hepatitis B  If you have a higher risk for hepatitis B, you should be screened for this virus. Talk with your health care provider to find out if you are at risk for hepatitis B infection. Hepatitis C  Blood testing is recommended for:  Everyone born from [birth year] through 1967.  Anyone with known risk factors for hepatitis C. Sexually Transmitted Diseases (STDs)  You should be screened each year for STDs including gonorrhea and chlamydia if:  You are sexually active and are younger than 38 years of age.  You are older than 38 years of age and your health care provider tells you that you are at risk for this type of infection.  Your sexual activity has changed since you were last screened and you are at an increased risk for chlamydia or gonorrhea. Ask your health care provider if you are at risk.  Talk with your health care provider about whether you are at high risk of being infected with HIV.  Your health care provider may recommend a prescription medicine to help prevent HIV infection. What else can I do?  Schedule regular health, dental, and eye exams.  Stay current with your vaccines (immunizations).  Do not use any tobacco products, such as cigarettes, chewing tobacco, and e-cigarettes. If you need help quitting, ask your health care provider.  Limit alcohol intake to no more than 2 drinks per day. One drink equals 12 ounces of beer, 5 ounces of wine, or 1 ounces of hard liquor.  Do not use street drugs.  Do not share needles.  Ask your health care provider for help if you need support or information about quitting drugs.  Tell your health care provider if you often feel depressed.  Tell your health care provider if you have ever been abused or do not feel safe at home. This information is not intended to replace advice given to you by your health care provider. Make sure  you discuss any questions you have with your health care provider. Document Released: 07/27/2007 Document Revised: 09/27/2015 Document Reviewed: 11/01/2014 Elsevier Interactive Patient Education  2017 [employer].

## 2016-06-20 NOTE — Assessment & Plan Note (Signed)
Blood pressure well controlled with current regimen and no adverse side effects or hypotensive readings. Denies worst headache of life with no symptoms of end organ damage. Continue current dosage of amlodipine and metoprolol.

## 2016-06-20 NOTE — Progress Notes (Signed)
Subjective:    Patient ID: Wayne Wise, male    DOB: 09-02-1980, 36 y.o.   MRN: [id number]  Chief Complaint  Patient presents with  . CPE    HPI:  Wayne Wise is a 36 y.o. male who presents today for an annual wellness visit.   1) Health Maintenance -   Diet - Averages about 3-4 meals per day consisting of a regular diet; Caffeine intake 2-3 cups every few days.   Exercise - No structured exercise    2) Preventative Exams / Immunizations:  Dental -- Up to date  Vision -- Up to date   Health Maintenance  Topic Date Due  . HIV Screening  07/22/1993  . INFLUENZA VACCINE  09/11/2016  . TETANUS/TDAP  11/18/2023    Immunization History  Administered Date(s) Administered  . Influenza-Unspecified 01/01/2015     Allergies  Allergen Reactions  . Sulfa Antibiotics Swelling     Outpatient Medications Prior to Visit  Medication Sig Dispense Refill  . amLODipine (NORVASC) 10 MG tablet Take 1 tablet (10 mg total) by mouth daily. Needs office visit for more refills 30 tablet 0  . metoprolol succinate (TOPROL-XL) 25 MG 24 hr tablet take 1 tablet by mouth once daily 90 tablet 3  . fluticasone (FLONASE) 50 MCG/ACT nasal spray Place 2 sprays into both nostrils daily. (Patient not taking: Reported on 06/20/2016) 16 g 5  . loratadine (CLARITIN) 10 MG tablet Take 10 mg by mouth daily.    Marland Kitchen testosterone enanthate (DELATESTRYL) 200 MG/ML injection Inject 1.5 mLs (300 mg total) into the muscle every 14 (fourteen) days. For IM use only (Patient not taking: Reported on 06/20/2016) 5 mL 2   No facility-administered medications prior to visit.      Past Medical History:  Diagnosis Date  . Arthritis   . Chicken pox   . 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 Kidney stones   . Migraines   . TBI (traumatic brain injury) [facility])      Past Surgical History:  Procedure Laterality Date  . FRACTURE SURGERY Left 2012     Family History  Problem Relation Age of Onset  . Arthritis  Mother   . Arthritis Maternal Grandmother   . Breast cancer Maternal Grandmother   . Diabetes Maternal Grandmother   . Stroke Maternal Grandfather   . Diabetes Maternal Grandfather   . Breast cancer Paternal Grandmother      Social History   Social History  . Marital status: Married    Spouse name: N/A  . Number of children: 1  . Years of education: 2   Occupational History  . [employer]    Social History Main Topics  . Smoking status: Never Smoker  . Smokeless tobacco: Former Systems developer  . Alcohol use Yes     Comment: occasionally  . Drug use: No  . Sexual activity: Not on file   Other Topics Concern  . Not on file   Social History Narrative   Born and raised in [place], [place]. Currently resides in a private residence wife and son. 1 cat (farm outside). Fun: Likes to hunt.    Denies religious beliefs that effect healthcare.       Review of Systems  Constitutional: Denies fever, chills, fatigue, or significant weight gain/loss. HENT: Head: Denies headache or neck pain Ears: Denies changes in hearing, ringing in ears, earache, drainage Nose: Denies discharge, stuffiness, itching, nosebleed, sinus pain Throat: Denies sore throat, hoarseness, dry  mouth, sores, thrush Eyes: Denies loss/changes in vision, pain, redness, blurry/double vision, flashing lights Cardiovascular: Denies chest pain/discomfort, tightness, palpitations, shortness of breath with activity, difficulty lying down, swelling, sudden awakening with shortness of breath Respiratory: Denies shortness of breath, cough, sputum production, wheezing Gastrointestinal: Denies dysphasia, heartburn, change in appetite, nausea, change in bowel habits, rectal bleeding, constipation, diarrhea, yellow skin or eyes Genitourinary: Denies frequency, urgency, burning/pain, blood in urine, incontinence, change in urinary strength. Musculoskeletal: Denies muscle/joint pain, stiffness, back pain, redness or swelling of joints,  trauma Skin: Denies rashes, lumps, itching, dryness, color changes, or hair/nail changes Neurological: Denies dizziness, fainting, seizures, weakness, numbness, tingling, tremor Psychiatric - Denies nervousness, stress, depression or memory loss Endocrine: Denies heat or cold intolerance, sweating, frequent urination, excessive thirst, changes in appetite Hematologic: Denies ease of bruising or bleeding     Objective:     BP 126/70 (BP Location: Left Arm, Patient Position: Sitting, Cuff Size: Large)   Pulse 86   Temp 98.3 F (36.8 C) (Oral)   Resp 16   Ht 6' (1.829 m)   Wt 268 lb (121.6 kg)   SpO2 98%   BMI 36.35 kg/m  Nursing note and vital signs reviewed.   Physical Exam  Constitutional: He is oriented to person, place, and time. He appears well-developed and well-nourished.  HENT:  Head: Normocephalic.  Right Ear: Hearing, tympanic membrane, external ear and ear canal normal.  Left Ear: Hearing, tympanic membrane, external ear and ear canal normal.  Nose: Nose normal.  Mouth/Throat: Uvula is midline, oropharynx is clear and moist and mucous membranes are normal.  Eyes: Conjunctivae and EOM are normal. Pupils are equal, round, and reactive to light.  Neck: Neck supple. No JVD present. No tracheal deviation present. No thyromegaly present.  Cardiovascular: Normal rate, regular rhythm, normal heart sounds and intact distal pulses.   Pulmonary/Chest: Effort normal and breath sounds normal.  Abdominal: Soft. Bowel sounds are normal. He exhibits no distension and no mass. There is no tenderness. There is no rebound and no guarding.  Musculoskeletal: Normal range of motion. He exhibits no edema or tenderness.  Lymphadenopathy:    He has no cervical adenopathy.  Neurological: He is alert and oriented to person, place, and time. He has normal reflexes. No cranial nerve deficit. He exhibits normal muscle tone. Coordination normal.  Skin: Skin is warm and dry.  Psychiatric: He has a  normal mood and affect. His behavior is normal. Judgment and thought content normal.       Assessment & Plan:   Problem List Items Addressed This Visit      Cardiovascular and Mediastinum   Essential hypertension    Blood pressure well controlled with current regimen and no adverse side effects or hypotensive readings. Denies worst headache of life with no symptoms of end organ damage. Continue current dosage of amlodipine and metoprolol.       Relevant Medications   amLODipine (NORVASC) 10 MG tablet   metoprolol succinate (TOPROL-XL) 25 MG 24 hr tablet     Respiratory   OSA (obstructive sleep apnea)    Stable with current CPAP and reports sleeping well. Continue CPAP with changes per sleep medicine.         Endocrine   Hypogonadism in male    Has continued fatigue and no longer taking testosterone injections. Would like to pursue other testosterone therapy options. Refer to urology for further assessment and treatment.       Relevant Orders   Ambulatory referral to  Urology     Other   Routine general medical examination at a health care facility - Primary    1) Anticipatory Guidance: Discussed importance of wearing a seatbelt while driving and not texting while driving; changing batteries in smoke detector at least once annually; wearing suntan lotion when outside; eating a balanced and moderate diet; getting physical activity at least 30 minutes per day.  2) Immunizations / Screenings / Labs:  All immunizations are up to date per recommendations. All screenings are up to date per recommendations. Obtain CBC, CMET, and lipid profile.    Overall well exam with risk factors for cardiovascular disease and chronic conditions include sleep apnea, hypertension, and obesity. Chronic conditions appears stable with medical intervention as he uses CPAP nightly and his blood pressure is well controlled with current medications. Recommend weight loss of 5-10% of current body weight  through nutrition and physical activity. Continue other healthy lifestyle behaviors and choices. Follow up prevention exam in 1 year and follow up office visit for chronic conditions.            I have discontinued Mr. Lynch loratadine, fluticasone, and testosterone enanthate. I have also changed his metoprolol succinate. Additionally, I am having him maintain his amLODipine.   Meds ordered this encounter  Medications  . amLODipine (NORVASC) 10 MG tablet    Sig: Take 1 tablet (10 mg total) by mouth daily. Needs office visit for more refills    Dispense:  90 tablet    Refill:  3    Order Specific Question:   Supervising Provider    Answer:   Pricilla Holm A [[id number]]  . metoprolol succinate (TOPROL-XL) 25 MG 24 hr tablet    Sig: Take 1 tablet (25 mg total) by mouth daily.    Dispense:  90 tablet    Refill:  3    Order Specific Question:   Supervising Provider    Answer:   Pricilla Holm A [[id number]]     Follow-up: Return in about 6 months (around 12/21/2016), or if symptoms worsen or fail to improve.   Mauricio Po, FNP

## 2016-06-20 NOTE — Assessment & Plan Note (Signed)
1) Anticipatory Guidance: Discussed importance of wearing a seatbelt while driving and not texting while driving; changing batteries in smoke detector at least once annually; wearing suntan lotion when outside; eating a balanced and moderate diet; getting physical activity at least 30 minutes per day.  2) Immunizations / Screenings / Labs:  All immunizations are up to date per recommendations. All screenings are up to date per recommendations. Obtain CBC, CMET, and lipid profile.    Overall well exam with risk factors for cardiovascular disease and chronic conditions include sleep apnea, hypertension, and obesity. Chronic conditions appears stable with medical intervention as he uses CPAP nightly and his blood pressure is well controlled with current medications. Recommend weight loss of 5-10% of current body weight through nutrition and physical activity. Continue other healthy lifestyle behaviors and choices. Follow up prevention exam in 1 year and follow up office visit for chronic conditions.

## 2016-06-20 NOTE — Assessment & Plan Note (Signed)
Has continued fatigue and no longer taking testosterone injections. Would like to pursue other testosterone therapy options. Refer to urology for further assessment and treatment.

## 2016-06-20 NOTE — Assessment & Plan Note (Signed)
Stable with current CPAP and reports sleeping well. Continue CPAP with changes per sleep medicine.

## 2016-07-01 ENCOUNTER — Encounter: Payer: Self-pay | Admitting: Family

## 2016-07-04 DIAGNOSIS — G4733 Obstructive sleep apnea (adult) (pediatric): Secondary | ICD-10-CM | POA: Diagnosis not present

## 2016-08-05 DIAGNOSIS — G4733 Obstructive sleep apnea (adult) (pediatric): Secondary | ICD-10-CM | POA: Diagnosis not present

## 2016-09-02 DIAGNOSIS — E291 Testicular hypofunction: Secondary | ICD-10-CM | POA: Diagnosis not present

## 2016-09-04 DIAGNOSIS — G4733 Obstructive sleep apnea (adult) (pediatric): Secondary | ICD-10-CM | POA: Diagnosis not present

## 2016-09-10 DIAGNOSIS — E291 Testicular hypofunction: Secondary | ICD-10-CM | POA: Diagnosis not present

## 2016-10-04 DIAGNOSIS — G4733 Obstructive sleep apnea (adult) (pediatric): Secondary | ICD-10-CM | POA: Diagnosis not present

## 2016-11-01 DIAGNOSIS — E291 Testicular hypofunction: Secondary | ICD-10-CM | POA: Diagnosis not present

## 2016-11-04 DIAGNOSIS — G4733 Obstructive sleep apnea (adult) (pediatric): Secondary | ICD-10-CM | POA: Diagnosis not present

## 2016-11-12 ENCOUNTER — Encounter: Payer: Self-pay | Admitting: Nurse Practitioner

## 2016-11-12 ENCOUNTER — Ambulatory Visit (INDEPENDENT_AMBULATORY_CARE_PROVIDER_SITE_OTHER): Payer: 59 | Admitting: Nurse Practitioner

## 2016-11-12 VITALS — BP 134/80 | HR 99 | Temp 98.4°F | Ht 72.0 in | Wt 255.0 lb

## 2016-11-12 DIAGNOSIS — J069 Acute upper respiratory infection, unspecified: Secondary | ICD-10-CM

## 2016-11-12 MED ORDER — CEFUROXIME AXETIL 250 MG PO TABS: 250.0000 mg | ORAL_TABLET | Freq: Two times a day (BID) | ORAL | 0 refills | Status: DC

## 2016-11-12 MED ORDER — METHYLPREDNISOLONE ACETATE 40 MG/ML IJ SUSP
40.0000 mg | Freq: Once | INTRAMUSCULAR | Status: AC
  Administered 2016-11-12: 40 mg via INTRAMUSCULAR

## 2016-11-12 MED ORDER — DM-GUAIFENESIN ER 30-600 MG PO TB12: 1.0000 | ORAL_TABLET | Freq: Two times a day (BID) | ORAL | 0 refills | Status: DC | PRN

## 2016-11-12 MED ORDER — SALINE SPRAY 0.65 % NA SOLN: 1.0000 | NASAL | 0 refills | Status: DC | PRN

## 2016-11-12 MED ORDER — IPRATROPIUM BROMIDE 0.03 % NA SOLN: 2.0000 | Freq: Two times a day (BID) | NASAL | 0 refills | Status: DC

## 2016-11-12 NOTE — Patient Instructions (Addendum)
[facility] Instructions: Encourage adequate oral hydration.  Use over-the-counter  "cold" medicines  such as "Tylenol cold" , "Advil cold",  "Mucinex" or" Mucinex D"  for cough and congestion.   Avoid decongestants if you have high blood pressure.  Use" Delsym" or" Robitussin" cough syrup varietis for cough.  You can use plain "Tylenol" or "Advi"l for fever, chills and achyness.   "Common cold" symptoms are usually triggered by a virus.  The antibiotics are usually not necessary. On average, a" viral cold" illness would take 4-7 days to resolve. Please, make an appointment if you are not better or if you're worse.  Start oral antibiotics if no improvement by Saturday.

## 2016-11-12 NOTE — Progress Notes (Signed)
Subjective:  Patient ID: Wayne Wise, male    DOB: 10/16/1980  Age: 36 y.o. MRN: [id number]  CC: Sinusitis (sore throat,headache,eyes and teeth pain, weak going on for 2 days. going out of town tomorrow. flu shot?)   Sinusitis  This is a new problem. Episode onset: 2days. The problem is unchanged. There has been no fever. Associated symptoms include chills, congestion, headaches, sinus pressure and a sore throat. Pertinent negatives include no coughing, hoarse voice, shortness of breath, sneezing or swollen glands. Past treatments include oral decongestants. The treatment provided no relief.    Outpatient Medications Prior to Visit  Medication Sig Dispense Refill  . amLODipine (NORVASC) 10 MG tablet Take 1 tablet (10 mg total) by mouth daily. Needs office visit for more refills 90 tablet 3  . metoprolol succinate (TOPROL-XL) 25 MG 24 hr tablet Take 1 tablet (25 mg total) by mouth daily. 90 tablet 3   No facility-administered medications prior to visit.     ROS See HPI  Objective:  BP 134/80 (BP Location: Left Arm, Cuff Size: Large)   Pulse 99   Temp 98.4 F (36.9 C)   Ht 6' (1.829 m)   Wt 255 lb (115.7 kg)   SpO2 98%   BMI 34.58 kg/m   BP Readings from Last 3 Encounters:  11/12/16 134/80  06/20/16 126/70  10/02/15 130/82    Wt Readings from Last 3 Encounters:  11/12/16 255 lb (115.7 kg)  06/20/16 268 lb (121.6 kg)  10/02/15 250 lb (113.4 kg)    Physical Exam  Constitutional: He is oriented to person, place, and time. No distress.  HENT:  Right Ear: Tympanic membrane, external ear and ear canal normal.  Left Ear: Tympanic membrane, external ear and ear canal normal.  Nose: Mucosal edema and rhinorrhea present. Right sinus exhibits maxillary sinus tenderness and frontal sinus tenderness. Left sinus exhibits maxillary sinus tenderness and frontal sinus tenderness.  Mouth/Throat: Uvula is midline. No trismus in the jaw. Posterior oropharyngeal erythema present. No  oropharyngeal exudate.  Cardiovascular: Normal rate and regular rhythm.   Pulmonary/Chest: Effort normal and breath sounds normal.  Lymphadenopathy:    He has no cervical adenopathy.  Neurological: He is alert and oriented to person, place, and time.  Vitals reviewed.   Lab Results  Component Value Date   WBC 11.2 (H) 05/16/2015   HGB 17.5 (H) 05/16/2015   HCT 51.2 05/16/2015   PLT 279.0 05/16/2015   GLUCOSE 78 01/12/2014   CHOL 190 06/20/2016   TRIG 164.0 (H) 01/12/2014   HDL 34 (A) 06/20/2016   LDLCALC 134 (H) 01/12/2014   NA 137 01/12/2014   K 4.0 01/12/2014   CL 103 01/12/2014   CREATININE 1.0 01/12/2014   BUN 18 01/12/2014   CO2 23 01/12/2014   TSH 2.43 01/16/2015   INR 0.89 12/15/2009   HGBA1C 5.2 01/16/2015    Dg Chest 2 View  Result Date: 08/31/2014 CLINICAL DATA:  Two months of cough and congestion, nonsmoker. EXAM: CHEST  2 VIEW COMPARISON:  None. FINDINGS: The lungs are adequately inflated and clear. The heart and pulmonary vascularity are normal. The mediastinum is normal in width. There is no pleural effusion. The bony thorax exhibits no acute abnormality. IMPRESSION: There is no active cardiopulmonary disease. Electronically Signed   By: David  Martinique M.D.   On: 08/31/2014 11:35    Assessment & Plan:   Kristine was seen today for sinusitis.  Diagnoses and all orders for this visit:  Acute URI -  methylPREDNISolone acetate (DEPO-MEDROL) injection 40 mg; Inject 1 mL (40 mg total) into the muscle once. -     ipratropium (ATROVENT) 0.03 % nasal spray; Place 2 sprays into both nostrils 2 (two) times daily. Do not use for more than 5days. -     dextromethorphan-guaiFENesin (MUCINEX DM) 30-600 MG 12hr tablet; Take 1 tablet by mouth 2 (two) times daily as needed for cough. -     sodium chloride (OCEAN) 0.65 % SOLN nasal spray; Place 1 spray into both nostrils as needed for congestion. -     cefUROXime (CEFTIN) 250 MG tablet; Take 1 tablet (250 mg total) by mouth 2  (two) times daily with a meal.   I am having Mr. Duerst start on ipratropium, dextromethorphan-guaiFENesin, sodium chloride, and cefUROXime. I am also having him maintain his amLODipine and metoprolol succinate. We administered methylPREDNISolone acetate.  Meds ordered this encounter  Medications  . methylPREDNISolone acetate (DEPO-MEDROL) injection 40 mg  . ipratropium (ATROVENT) 0.03 % nasal spray    Sig: Place 2 sprays into both nostrils 2 (two) times daily. Do not use for more than 5days.    Dispense:  30 mL    Refill:  0    Order Specific Question:   Supervising Provider    Answer:   Cassandria Anger [[id number]]  . dextromethorphan-guaiFENesin (MUCINEX DM) 30-600 MG 12hr tablet    Sig: Take 1 tablet by mouth 2 (two) times daily as needed for cough.    Dispense:  14 tablet    Refill:  0    Order Specific Question:   Supervising Provider    Answer:   Cassandria Anger [[id number]]  . sodium chloride (OCEAN) 0.65 % SOLN nasal spray    Sig: Place 1 spray into both nostrils as needed for congestion.    Dispense:  15 mL    Refill:  0    Order Specific Question:   Supervising Provider    Answer:   Cassandria Anger [[id number]]  . cefUROXime (CEFTIN) 250 MG tablet    Sig: Take 1 tablet (250 mg total) by mouth 2 (two) times daily with a meal.    Dispense:  20 tablet    Refill:  0    Do not fill before 11/16/2016    Order Specific Question:   Supervising Provider    Answer:   Cassandria Anger [[id number]]    Follow-up: No Follow-up on file.  Wilfred Lacy, NP

## 2016-12-04 DIAGNOSIS — G4733 Obstructive sleep apnea (adult) (pediatric): Secondary | ICD-10-CM | POA: Diagnosis not present

## 2016-12-11 DIAGNOSIS — E291 Testicular hypofunction: Secondary | ICD-10-CM | POA: Diagnosis not present

## 2016-12-25 ENCOUNTER — Other Ambulatory Visit (INDEPENDENT_AMBULATORY_CARE_PROVIDER_SITE_OTHER): Payer: 59

## 2016-12-25 ENCOUNTER — Ambulatory Visit: Payer: 59 | Admitting: Nurse Practitioner

## 2016-12-25 ENCOUNTER — Encounter: Payer: Self-pay | Admitting: Nurse Practitioner

## 2016-12-25 VITALS — BP 144/70 | HR 89 | Temp 98.2°F | Resp 16 | Ht 72.0 in | Wt 262.0 lb

## 2016-12-25 DIAGNOSIS — R5382 Chronic fatigue, unspecified: Secondary | ICD-10-CM

## 2016-12-25 DIAGNOSIS — R609 Edema, unspecified: Secondary | ICD-10-CM | POA: Diagnosis not present

## 2016-12-25 DIAGNOSIS — Z87820 Personal history of traumatic brain injury: Secondary | ICD-10-CM | POA: Insufficient documentation

## 2016-12-25 LAB — CBC
HCT: 50.2 % (ref 39.0–52.0)
Hemoglobin: 16.8 g/dL (ref 13.0–17.0)
MCHC: 33.5 g/dL (ref 30.0–36.0)
MCV: 94.5 fl (ref 78.0–100.0)
PLATELETS: 231 10*3/uL (ref 150.0–400.0)
RBC: 5.31 Mil/uL (ref 4.22–5.81)
RDW: 12.7 % (ref 11.5–15.5)
WBC: 7.2 10*3/uL (ref 4.0–10.5)

## 2016-12-25 LAB — COMPREHENSIVE METABOLIC PANEL
ALBUMIN: 4.6 g/dL (ref 3.5–5.2)
ALK PHOS: 63 U/L (ref 39–117)
ALT: 29 U/L (ref 0–53)
AST: 21 U/L (ref 0–37)
BILIRUBIN TOTAL: 0.5 mg/dL (ref 0.2–1.2)
BUN: 18 mg/dL (ref 6–23)
CALCIUM: 9.4 mg/dL (ref 8.4–10.5)
CO2: 26 mEq/L (ref 19–32)
CREATININE: 1.03 mg/dL (ref 0.40–1.50)
Chloride: 103 mEq/L (ref 96–112)
GFR: 85.71 mL/min (ref >60.00)
Glucose, Bld: 94 mg/dL (ref 70–99)
Potassium: 4 mEq/L (ref 3.5–5.1)
Sodium: 139 mEq/L (ref 135–145)
Total Protein: 7.6 g/dL (ref 6.0–8.3)

## 2016-12-25 LAB — TSH: TSH: 1.91 u[IU]/mL (ref 0.35–4.50)

## 2016-12-25 NOTE — Patient Instructions (Addendum)
Please head downstairs for lab work.  I have placed a referral to cardiology . Our office will call you to schedule this appointment.  You can try a daily multivitamin to see if this helps your fatigue. Id also recommend working on your diet and exercise as we discussed. Remember half of your plate should be veggies, one-fourth carbs, one-fourth meat, and don't eat meat at every meal. Also, remember to stay away from sugary drinks. For exercise, you should Start at 10 minutes a day, working up to 30 minutes five times a week.   Ill see you back in 3 months to see how you are doing, or sooner if you need me.  Fatigue Fatigue is feeling tired all of the time, a lack of energy, or a lack of motivation. Occasional or mild fatigue is often a normal response to activity or life in general. However, long-lasting (chronic) or extreme fatigue may indicate an underlying medical condition. Follow these instructions at home: Watch your fatigue for any changes. The following actions may help to lessen any discomfort you are feeling:  Talk to your health care provider about how much sleep you need each night. Try to get the required amount every night.  Take medicines only as directed by your health care provider.  Eat a healthy and nutritious diet. Ask your health care provider if you need help changing your diet.  Drink enough fluid to keep your urine clear or pale yellow.  Practice ways of relaxing, such as yoga, meditation, massage therapy, or acupuncture.  Exercise regularly.  Change situations that cause you stress. Try to keep your work and personal routine reasonable.  Do not abuse illegal drugs.  Limit alcohol intake to no more than 1 drink per day for nonpregnant women and 2 drinks per day for men. One drink equals 12 ounces of beer, 5 ounces of wine, or 1 ounces of hard liquor.  Take a multivitamin, if directed by your health care provider.  Contact a health care provider if:  Your  fatigue does not get better.  You have a fever.  You have unintentional weight loss or gain.  You have headaches.  You have difficulty: ? Falling asleep. ? Sleeping throughout the night.  You feel angry, guilty, anxious, or sad.  You are unable to have a bowel movement (constipation).  You skin is dry.  Your legs or another part of your body is swollen. Get help right away if:  You feel confused.  Your vision is blurry.  You feel faint or pass out.  You have a severe headache.  You have severe abdominal, pelvic, or back pain.  You have chest pain, shortness of breath, or an irregular or fast heartbeat.  You are unable to urinate or you urinate less than normal.  You develop abnormal bleeding, such as bleeding from the rectum, vagina, nose, lungs, or nipples.  You vomit blood.  You have thoughts about harming yourself or committing suicide.  You are worried that you might harm someone else. This information is not intended to replace advice given to you by your health care provider. Make sure you discuss any questions you have with your health care provider. Document Released: 11/25/2006 Document Revised: 07/06/2015 Document Reviewed: 06/01/2013 Elsevier Interactive Patient Education  [employer].
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
## 2016-12-30 NOTE — Progress Notes (Signed)
Cardiology Office Note   Date:  12/31/2016   ID:  Wayne Wise, DOB 06-29-1980, MRN [id number]  PCP:  Lance Sell, NP  Cardiologist:   Jenkins Rouge, MD   No chief complaint on file.     History of Present Illness: Wayne Wise is a 36 y.o. male who presents for consultation regarding fatigue and peripheral edema. Referred By Caesar Chestnut NP. Reviewed her office note from 12/25/16 He has chronic fatigue HTN and low T Also with OSA on CPAP He is overweight and sedentary Patient wanted to see cardiology in regard to his fatigue He has mild dependant edema  He indicates symptoms for years and pre dating his beta blocker and norvasc He takes his BP meds at night. 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 Denies ETOH excess Somewhat sedentary     Past Medical History:  Diagnosis Date  . Arthritis   . Chicken pox   . 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 Kidney stones   . Migraines   . TBI (traumatic brain injury) [facility])     Past Surgical History:  Procedure Laterality Date  . FRACTURE SURGERY Left 2012     Current Outpatient Medications  Medication Sig Dispense Refill  . amLODipine (NORVASC) 10 MG tablet Take 1 tablet (10 mg total) by mouth daily. Needs office visit for more refills 90 tablet 3  . metoprolol succinate (TOPROL-XL) 25 MG 24 hr tablet Take 1 tablet (25 mg total) by mouth daily. 90 tablet 3   No current facility-administered medications for this visit.     Allergies:   Sulfa antibiotics    Social History:  The patient  reports that  has never smoked. He has quit using smokeless tobacco. He reports that he drinks alcohol. He reports that he does not use drugs.   Family History:  The patient's family history includes Arthritis in his maternal grandmother and mother; Breast cancer in his maternal grandmother and paternal grandmother; Diabetes in his maternal grandfather and  maternal grandmother; Stroke in his maternal grandfather.    ROS:  Please see the history of present illness.   Otherwise, review of systems are positive for none.   All other systems are reviewed and negative.    PHYSICAL EXAM: VS:  BP (!) 148/88   Pulse 83   Ht 6' (1.829 m)   Wt 261 lb 8 oz (118.6 kg)   SpO2 97%   BMI 35.47 kg/m  , BMI Body mass index is 35.47 kg/m. Affect appropriate Healthy:  appears stated age 62: normal Neck supple with no adenopathy JVP normal no bruits no thyromegaly Lungs clear with no wheezing and good diaphragmatic motion Heart:  S1/S2 1/6 SEM murmur, no rub, gallop or click PMI normal Abdomen: benighn, BS positve, no tenderness, no AAA no bruit.  No HSM or HJR Distal pulses intact with no bruits No edema Neuro non-focal Skin warm and dry No muscular weakness    EKG:  SR rate 78 normal 12/25/16    Recent Labs: 12/25/2016: ALT 29; BUN 18; Creatinine, Ser 1.03; Hemoglobin 16.8; Platelets 231.0; Potassium 4.0; Sodium 139; TSH 1.91    Lipid Panel    Component Value Date/Time   CHOL 190 06/20/2016   TRIG 164.0 (H) 01/12/2014 0936   HDL 34 (A) 06/20/2016   CHOLHDL 7 01/12/2014 0936   VLDL 32.8 01/12/2014  5035   LDLCALC 134 (H) 01/12/2014 0936      Wt Readings from Last 3 Encounters:  12/31/16 261 lb 8 oz (118.6 kg)  12/25/16 262 lb (118.8 kg)  11/12/16 255 lb (115.7 kg)      Other studies Reviewed: Additional studies/ records that were reviewed today include: Primary office notes ECG and labs .    ASSESSMENT AND PLAN:  1.  Fatigue doubt related to heart consider stopping beta blocker  2. HTN given symptoms of fatigue and edema would stop beta blocker and norvasc and RX With Hyzaar 100/25 mg and see if he improves he will discuss with primary  3. HLD diet Rx no documented vascular disease  4. OSA compliant with CPAP  5. Edema: no objective signs consider diuretic see BP med change above  Will order echo for above  symptoms    Current medicines are reviewed at length with the patient today.  The patient does not have concerns regarding medicines.  The following changes have been made:  no change  Labs/ tests ordered today include: Echo   Orders Placed This Encounter  Procedures  . ECHOCARDIOGRAM COMPLETE     Disposition:   FU with cardiology PRN      Signed, Jenkins Rouge, MD  12/31/2016 1:26 PM    [facility] [address], [place], [place]  [address] Phone: [phone]; Fax: [phone]

## 2016-12-31 ENCOUNTER — Ambulatory Visit: Payer: 59 | Admitting: Cardiovascular Disease

## 2016-12-31 ENCOUNTER — Encounter: Payer: Self-pay | Admitting: Cardiovascular Disease

## 2016-12-31 VITALS — BP 148/88 | HR 83 | Ht 72.0 in | Wt 261.5 lb

## 2016-12-31 DIAGNOSIS — R0602 Shortness of breath: Secondary | ICD-10-CM | POA: Diagnosis not present

## 2016-12-31 NOTE — Patient Instructions (Signed)
Medication Instructions:  Your physician recommends that you continue on your current medications as directed. Please refer to the Current Medication list given to you today.   Labwork: None   Testing/Procedures: Your physician has requested that you have an echocardiogram. Echocardiography is a painless test that uses sound waves to create images of your heart. It provides your doctor with information about the size and shape of your heart and how well your heart's chambers and valves are working. This procedure takes approximately one hour. There are no restrictions for this procedure.  Follow-Up: As needed with Dr Johnsie Cancel.       If you need a refill on your cardiac medications before your next appointment, please call your pharmacy.

## 2017-01-03 DIAGNOSIS — G4733 Obstructive sleep apnea (adult) (pediatric): Secondary | ICD-10-CM | POA: Diagnosis not present

## 2017-01-07 ENCOUNTER — Ambulatory Visit: Payer: 59 | Admitting: Family Medicine

## 2017-01-07 ENCOUNTER — Encounter: Payer: Self-pay | Admitting: Family Medicine

## 2017-01-07 VITALS — BP 124/78 | HR 82 | Temp 98.4°F | Wt 260.0 lb

## 2017-01-07 DIAGNOSIS — R252 Cramp and spasm: Secondary | ICD-10-CM | POA: Diagnosis not present

## 2017-01-07 NOTE — Patient Instructions (Signed)
Called the urologist who gave her the Clomid for advice. This appears to be a side effect from your medication

## 2017-01-07 NOTE — Progress Notes (Signed)
Wayne Wise is a 36 year old married male nonsmoker.......... [employer] by occupation.... Who comes in today for evaluation of twitching.  He was placed on Clomid 50 mg daily about 4-5 weeks ago by his urologist.  A couple days ago he had a sensation of twitching in spasms that last about 2 hours and went away. Yesterday it started 2:00 and will go away.  Review of systems otherwise negative  Vs BP 124/78 (BP Location: Left Arm, Patient Position: Sitting, Cuff Size: Large)   Pulse 82   Temp 98.4 F (36.9 C) (Oral)   Wt 260 lb (117.9 kg)   BMI 35.26 kg/m  Well-developed well-nourished male no acute distress quietly sitting twitching and bending his hands.  Spasms probably secondary to Clomid........... stop the Clomid.....Marland Kitchen call your urologist who gave you the medication for feedback.

## 2017-01-08 ENCOUNTER — Other Ambulatory Visit: Payer: Self-pay

## 2017-01-08 ENCOUNTER — Ambulatory Visit (HOSPITAL_COMMUNITY): Payer: 59 | Attending: Cardiology

## 2017-01-08 ENCOUNTER — Telehealth: Payer: Self-pay | Admitting: Cardiovascular Disease

## 2017-01-08 DIAGNOSIS — E785 Hyperlipidemia, unspecified: Secondary | ICD-10-CM | POA: Insufficient documentation

## 2017-01-08 DIAGNOSIS — R609 Edema, unspecified: Secondary | ICD-10-CM | POA: Insufficient documentation

## 2017-01-08 DIAGNOSIS — G4733 Obstructive sleep apnea (adult) (pediatric): Secondary | ICD-10-CM | POA: Diagnosis not present

## 2017-01-08 DIAGNOSIS — R0602 Shortness of breath: Secondary | ICD-10-CM

## 2017-01-08 NOTE — Telephone Encounter (Signed)
New message    Pt states he was supposed to call and let Dr. Johnsie Cancel know the name of one of his medications. He said it is clomiphene 50 mg and he takes it once daily.

## 2017-01-08 NOTE — Telephone Encounter (Signed)
Informed patient that medication is on he list.

## 2017-02-05 DIAGNOSIS — G4733 Obstructive sleep apnea (adult) (pediatric): Secondary | ICD-10-CM | POA: Diagnosis not present

## 2017-03-04 DIAGNOSIS — G4733 Obstructive sleep apnea (adult) (pediatric): Secondary | ICD-10-CM | POA: Diagnosis not present

## 2017-03-22 ENCOUNTER — Emergency Department (HOSPITAL_BASED_OUTPATIENT_CLINIC_OR_DEPARTMENT_OTHER): Payer: Worker's Compensation

## 2017-03-22 ENCOUNTER — Emergency Department (HOSPITAL_BASED_OUTPATIENT_CLINIC_OR_DEPARTMENT_OTHER)
Admission: EM | Admit: 2017-03-22 | Discharge: 2017-03-22 | Disposition: A | Discharge status: Home / Self Care | Payer: Worker's Compensation | Attending: Physician Assistant | Admitting: Physician Assistant

## 2017-03-22 ENCOUNTER — Encounter (HOSPITAL_BASED_OUTPATIENT_CLINIC_OR_DEPARTMENT_OTHER): Payer: Self-pay | Admitting: *Deleted

## 2017-03-22 DIAGNOSIS — W1789XA Other fall from one level to another, initial encounter: Secondary | ICD-10-CM | POA: Insufficient documentation

## 2017-03-22 DIAGNOSIS — Y929 Unspecified place or not applicable: Secondary | ICD-10-CM | POA: Insufficient documentation

## 2017-03-22 DIAGNOSIS — S4992XA Unspecified injury of left shoulder and upper arm, initial encounter: Secondary | ICD-10-CM | POA: Diagnosis not present

## 2017-03-22 DIAGNOSIS — Y9339 Activity, other involving climbing, rappelling and jumping off: Secondary | ICD-10-CM | POA: Insufficient documentation

## 2017-03-22 DIAGNOSIS — Y990 Civilian activity done for income or pay: Secondary | ICD-10-CM | POA: Insufficient documentation

## 2017-03-22 DIAGNOSIS — W19XXXA Unspecified fall, initial encounter: Secondary | ICD-10-CM

## 2017-03-22 MED ORDER — HYDROCODONE-ACETAMINOPHEN 5-325 MG PO TABS: 1.0000 | ORAL_TABLET | Freq: Once | ORAL | Status: DC

## 2017-03-22 MED ORDER — ACETAMINOPHEN-CODEINE #3 300-30 MG PO TABS
2.0000 | ORAL_TABLET | Freq: Once | ORAL | Status: AC
  Administered 2017-03-22: 2 via ORAL
  Filled 2017-03-22: qty 2

## 2017-03-22 NOTE — ED Notes (Signed)
ED Provider at bedside. 

## 2017-03-22 NOTE — ED Notes (Signed)
Patient transported to X-ray 

## 2017-03-22 NOTE — ED Notes (Signed)
Ice pack applied.
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
## 2017-03-22 NOTE — ED Notes (Signed)
Pt given d/c instructions as per chart. Verbalizes understanding. No questions. 
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
## 2017-03-22 NOTE — Discharge Instructions (Signed)
Please read instructions below. Apply ice to your shoulder for 20 minutes at a time. You can take ibuprofen every 6 hours as needed for pain. Schedule an appointment with the orthopedic specialist in 1 for follow-up on your injury. Return to the ER for new or concerning symptoms.

## 2017-03-26 ENCOUNTER — Encounter: Payer: Self-pay | Admitting: Nurse Practitioner

## 2017-03-26 ENCOUNTER — Other Ambulatory Visit: Payer: Self-pay | Admitting: Occupational Medicine

## 2017-03-26 ENCOUNTER — Ambulatory Visit: Payer: 59 | Admitting: Nurse Practitioner

## 2017-03-26 ENCOUNTER — Ambulatory Visit: Payer: Self-pay

## 2017-03-26 VITALS — BP 126/72 | HR 93 | Temp 98.7°F | Resp 16 | Ht 72.0 in | Wt 250.0 lb

## 2017-03-26 DIAGNOSIS — M25562 Pain in left knee: Secondary | ICD-10-CM

## 2017-03-26 DIAGNOSIS — I10 Essential (primary) hypertension: Secondary | ICD-10-CM

## 2017-03-26 DIAGNOSIS — M25522 Pain in left elbow: Secondary | ICD-10-CM | POA: Diagnosis not present

## 2017-03-26 MED ORDER — LOSARTAN POTASSIUM-HCTZ 100-25 MG PO TABS: 1.0000 | ORAL_TABLET | Freq: Every day | ORAL | 1 refills | Status: DC
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
## 2017-03-26 NOTE — Patient Instructions (Addendum)
I have ordered an xray of your left elbow.  I have sent a new prescription for losartan-HCTZ 100-25 once daily to your pharmacy. Please stop your amlodipine and metoprolol and start the new medication. Please return in about 2 weeks so we can follow up on your blood pressure and see if your fatigue and edema have improved.  Remember healthy diet and exercise to keep your blood pressure controlled.  It was good to see you. Thanks for letting me take care of you today :)   Mediterranean Diet A Mediterranean diet refers to food and lifestyle choices that are based on the traditions of countries located on the [employer]. This way of eating has been shown to help prevent certain conditions and improve outcomes for people who have chronic diseases, like kidney disease and heart disease. What are tips for following this plan? Lifestyle  Cook and eat meals together with your family, when possible.  Drink enough fluid to keep your urine clear or pale yellow.  Be physically active every day. This includes: ? Aerobic exercise like running or swimming. ? Leisure activities like gardening, walking, or housework.  Get 7-8 hours of sleep each night.  If recommended by your health care provider, drink red wine in moderation. This means 1 glass a day for nonpregnant women and 2 glasses a day for men. A glass of wine equals 5 oz (150 mL). Reading food labels  Check the serving size of packaged foods. For foods such as rice and pasta, the serving size refers to the amount of cooked product, not dry.  Check the total fat in packaged foods. Avoid foods that have saturated fat or trans fats.  Check the ingredients list for added sugars, such as corn syrup. Shopping  At the grocery store, buy most of your food from the areas near the walls of the store. This includes: ? Fresh fruits and vegetables (produce). ? Grains, beans, nuts, and seeds. Some of these may be available in unpackaged forms or  large amounts (in bulk). ? Fresh seafood. ? Poultry and eggs. ? Low-fat dairy products.  Buy whole ingredients instead of prepackaged foods.  Buy fresh fruits and vegetables in-season from local farmers markets.  Buy frozen fruits and vegetables in resealable bags.  If you do not have access to quality fresh seafood, buy precooked frozen shrimp or canned fish, such as tuna, salmon, or sardines.  Buy small amounts of raw or cooked vegetables, salads, or olives from the deli or salad bar at your store.  Stock your pantry so you always have certain foods on hand, such as olive oil, canned tuna, canned tomatoes, rice, pasta, and beans. Cooking  Cook foods with extra-virgin olive oil instead of using butter or other vegetable oils.  Have meat as a side dish, and have vegetables or grains as your main dish. This means having meat in small portions or adding small amounts of meat to foods like pasta or stew.  Use beans or vegetables instead of meat in common dishes like chili or lasagna.  Experiment with different cooking methods. Try roasting or broiling vegetables instead of steaming or sauteing them.  Add frozen vegetables to soups, stews, pasta, or rice.  Add nuts or seeds for added healthy fat at each meal. You can add these to yogurt, salads, or vegetable dishes.  Marinate fish or vegetables using olive oil, lemon juice, garlic, and fresh herbs. Meal planning  Plan to eat 1 vegetarian meal one day each week. Try  to work up to 2 vegetarian meals, if possible.  Eat seafood 2 or more times a week.  Have healthy snacks readily available, such as: ? Vegetable sticks with hummus. ? [place] yogurt. ? Fruit and nut trail mix.  Eat balanced meals throughout the week. This includes: ? Fruit: 2-3 servings a day ? Vegetables: 4-5 servings a day ? Low-fat dairy: 2 servings a day ? Fish, poultry, or lean meat: 1 serving a day ? Beans and legumes: 2 or more servings a week ? Nuts and  seeds: 1-2 servings a day ? Whole grains: 6-8 servings a day ? Extra-virgin olive oil: 3-4 servings a day  Limit red meat and sweets to only a few servings a month What are my food choices?  Mediterranean diet ? Recommended ? Grains: Whole-grain pasta. Brown rice. Bulgar wheat. Polenta. Couscous. Whole-wheat bread. [place]. ? Vegetables: Artichokes. Beets. Broccoli. Cabbage. Carrots. Eggplant. Green beans. Chard. Kale. Spinach. Onions. Leeks. Peas. Squash. Tomatoes. Peppers. Radishes. ? Fruits: Apples. Apricots. Avocado. Berries. Bananas. Cherries. Dates. Figs. Grapes. Lemons. Melon. Oranges. Peaches. Plums. Pomegranate. ? Meats and other protein foods: Beans. Almonds. Sunflower seeds. Pine nuts. Peanuts. [place]. Salmon. Scallops. Shrimp. [place]. Tilapia. Clams. Oysters. Eggs. ? Dairy: Low-fat milk. Cheese. Greek yogurt. ? Beverages: Water. Red wine. Herbal tea. ? Fats and oils: Extra virgin olive oil. Avocado oil. Grape seed oil. ? Sweets and desserts: [place] yogurt with honey. Baked apples. Poached pears. Trail mix. ? Seasoning and other foods: Basil. Cilantro. Coriander. Cumin. Mint. Parsley. Sage. Rosemary. Tarragon. Garlic. Oregano. Thyme. Pepper. Balsalmic vinegar. Tahini. Hummus. Tomato sauce. Olives. Mushrooms. ? Limit these ? Grains: Prepackaged pasta or rice dishes. Prepackaged cereal with added sugar. ? Vegetables: Deep fried potatoes (french fries). ? Fruits: Fruit canned in syrup. ? Meats and other protein foods: Beef. Pork. Lamb. Poultry with skin. Hot dogs. Berniece Salines. ? Dairy: Ice cream. Sour cream. Whole milk. ? Beverages: Juice. Sugar-sweetened soft drinks. Beer. Liquor and spirits. ? Fats and oils: Butter. [place]ola oil. Vegetable oil. Beef fat (tallow). Lard. ? Sweets and desserts: Cookies. Cakes. Pies. Candy. ? Seasoning and other foods: Mayonnaise. Premade sauces and marinades. ? The items listed may not be a complete list. Talk with your dietitian about what dietary  choices are right for you. Summary  The Mediterranean diet includes both food and lifestyle choices.  Eat a variety of fresh fruits and vegetables, beans, nuts, seeds, and whole grains.  Limit the amount of red meat and sweets that you eat.  Talk with your health care provider about whether it is safe for you to drink red wine in moderation. This means 1 glass a day for nonpregnant women and 2 glasses a day for men. A glass of wine equals 5 oz (150 mL). This information is not intended to replace advice given to you by your health care provider. Make sure you discuss any questions you have with your health care provider. Document Released: 09/21/2015 Document Revised: 10/24/2015 Document Reviewed: 09/21/2015 Elsevier Interactive Patient Education  [employer].

## 2017-03-26 NOTE — Assessment & Plan Note (Signed)
We will stop amlodipine, metoprolol and start hyzaar per dr Johnsie Cancel recommendations. Discussed side effects and dosing with patient. He was also encouraged to monitor his blood pressure at home but tells me that honestly he does not think he will remember to check it Discussed the role of healthy diet and exercise in the management of high blood pressure-See AVS for education provided to patient. - losartan-hydrochlorothiazide (HYZAAR) 100-25 MG tablet; Take 1 tablet by mouth daily.  Dispense: 30 tablet; Refill: 1 RTC in 2 weeks for follow up, we will see if his fatigue and edema improve

## 2017-03-27 ENCOUNTER — Ambulatory Visit: Payer: Self-pay | Admitting: Nurse Practitioner

## 2017-04-11 ENCOUNTER — Encounter: Payer: Self-pay | Admitting: Nurse Practitioner

## 2017-04-11 ENCOUNTER — Ambulatory Visit (INDEPENDENT_AMBULATORY_CARE_PROVIDER_SITE_OTHER)
Admission: RE | Admit: 2017-04-11 | Discharge: 2017-04-11 | Disposition: A | Discharge status: Home / Self Care | Payer: 59 | Source: Ambulatory Visit | Attending: Nurse Practitioner | Admitting: Nurse Practitioner

## 2017-04-11 ENCOUNTER — Ambulatory Visit: Payer: 59 | Admitting: Nurse Practitioner

## 2017-04-11 ENCOUNTER — Other Ambulatory Visit (INDEPENDENT_AMBULATORY_CARE_PROVIDER_SITE_OTHER): Payer: 59

## 2017-04-11 VITALS — BP 122/72 | HR 117 | Resp 16 | Ht 72.0 in | Wt 263.0 lb

## 2017-04-11 DIAGNOSIS — I10 Essential (primary) hypertension: Secondary | ICD-10-CM | POA: Diagnosis not present

## 2017-04-11 DIAGNOSIS — R5383 Other fatigue: Secondary | ICD-10-CM

## 2017-04-11 LAB — BASIC METABOLIC PANEL
BUN: 17 mg/dL (ref 6–23)
CALCIUM: 9.9 mg/dL (ref 8.4–10.5)
CO2: 29 meq/L (ref 19–32)
Chloride: 100 mEq/L (ref 96–112)
Creatinine, Ser: 1.21 mg/dL (ref 0.40–1.50)
GFR: 71.06 mL/min (ref >60.00)
GLUCOSE: 134 mg/dL — AB (ref 70–99)
Potassium: 3.9 mEq/L (ref 3.5–5.1)
SODIUM: 139 meq/L (ref 135–145)

## 2017-04-11 LAB — HEMOGLOBIN A1C: Hgb A1c MFr Bld: 5.4 % (ref 4.6–6.5)
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
## 2017-04-11 NOTE — Patient Instructions (Signed)
Please head downstairs for lab work.  Please return in about 3 months, so we can follow up on your blood pressure and fatigue.  It was good to see you. Thanks for letting me take care of you today :)   There are natural ways to boost your testosterone:  1. Lose Weight If you're overweight, shedding the excess pounds may increase your testosterone levels, according to multiple research. Overweight men are more likely to have low testosterone levels to begin with, so this is an important trick to increase your body's testosterone production when you need it most.   2. Strength Training    Strength training is also known to boost testosterone levels, provided you are doing so intensely enough. When strength training to boost testosterone, you'll want to increase the weight and lower your number of reps, and then focus on exercises that work a large number of muscles.  3. Optimize Your Vitamin D Levels Vitamin D, a steroid hormone, is essential for the healthy development of the nucleus of the sperm cell, and helps maintain semen quality and sperm count. Vitamin D also increases levels of testosterone, which may boost libido. In one study, overweight men who were given vitamin D supplements had a significant increase in testosterone levels after one year.  4. Reduce Stress When you're under a lot of stress, your body releases high levels of the stress hormone cortisol. This hormone actually blocks the effects of testosterone, presumably because, from a biological standpoint, testosterone-associated behaviors (mating, competing, aggression) may have lowered your chances of survival in an emergency (hence, the "fight or flight" response is dominant, courtesy of cortisol).  5. Limit or Eliminate Sugar from Your Diet Testosterone levels decrease after you eat sugar, which is likely because the sugar leads to a high insulin level, another factor leading to low testosterone.  6. Eat Healthy Fats By  healthy, this means not only mon- and polyunsaturated fats, like that found in avocadoes and nuts, but also saturated, as these are essential for building testosterone. Research shows that a diet with less than 40 percent of energy as fat (and that mainly from animal sources, i.e. saturated) lead to a decrease in testosterone levels.  It's important to understand that your body requires saturated fats from animal and vegetable sources (such as meat, dairy, certain oils, and tropical plants like coconut) for optimal functioning, and if you neglect this important food group in favor of sugar, grains and other starchy carbs, your health and weight are almost guaranteed to suffer. Examples of healthy fats you can eat more of to give your testosterone levels a boost include:  Olives and Olive oil  Coconuts and coconut oil Butter made from organic milk  Raw nuts, such as, almonds or pecans Eggs Avocados   Meats Palm oil Unheated organic nut oils   7. "Testosterone boosters" containing Vitamin D-3, Niacin, Vitamin B-6, Vitamin B-12, Magnesium, Zinc, Selenium, D-Aspartic Acid, Fenugreed Seed Extract, Oystershell, [place] Extract, [place] Ginseng may be helpful as well.

## 2017-04-12 ENCOUNTER — Encounter: Payer: Self-pay | Admitting: Nurse Practitioner

## 2017-04-12 NOTE — Assessment & Plan Note (Signed)
Stable on hyzaar, will continue at current dosage Will follow up in 3 months to make sure BP remains stable - Basic metabolic panel; Future

## 2017-04-14 LAB — VITAMIN B12: Vitamin B-12: 608 pg/mL (ref 211–911)

## 2017-04-14 LAB — VITAMIN D 25 HYDROXY (VIT D DEFICIENCY, FRACTURES): VITD: 24.28 ng/mL — ABNORMAL LOW (ref 30.00–100.00)

## 2017-04-18 ENCOUNTER — Telehealth: Payer: Self-pay | Admitting: Nurse Practitioner

## 2017-04-18 DIAGNOSIS — Z87820 Personal history of traumatic brain injury: Secondary | ICD-10-CM

## 2017-04-18 DIAGNOSIS — G43109 Migraine with aura, not intractable, without status migrainosus: Secondary | ICD-10-CM

## 2017-04-18 NOTE — Telephone Encounter (Signed)
Copied from [place] [id number]. Topic: Quick Communication - Lab Results >> Apr 18, 2017 10:53 AM Burnis Medin, NT wrote: Patient wife called and said she saw patient's results on the portable and had some questions. Pt wife requested that the provider call her back.

## 2017-04-18 NOTE — Telephone Encounter (Signed)
Spoke with pts wife and advised of the lab results. She stated that she was concerned about the amount patients kidneys has decreased over time and was wondering what is causing that or contributing to that. I did advise that hydration, uncontrolled blood pressure and blood sugars can cause changes in kidney function. She stated she was aware of that but still did not understand why his kidney function has decreased so much. He does have a hx of TIA and has headaches often and takes ibuprofen for these headaches which she understands can affect kidneys if you take a lot of ibuprofen. States that he needs a referral to a headache specialist as well. Please advise.

## 2017-04-21 NOTE — Telephone Encounter (Signed)
Spoke with wife and advised that we will keep check on kidney function and a referral has been sent to neurology for headaches. I advised that pt stay hydrated and watch his sugars and BP to help with kidney function. Wife understood and stated that she will get pt to set up an appointment for a physical.

## 2017-04-21 NOTE — Telephone Encounter (Signed)
We will continue to monitor his kidney function regularly.  Will you please place referral to neurology for headaches?

## 2017-05-02 ENCOUNTER — Telehealth: Payer: Self-pay | Admitting: Nurse Practitioner

## 2017-05-02 NOTE — Telephone Encounter (Signed)
Can we call [facility] and see if they would be willing to switch the patients upcoming appointment to Dr Dohmeier? He is scheduled to see Dr Jannifer Franklin on 5/21 for migraines with a history of traumatic brain injury. As I having been thinking about his case, it may be helpful for him to see a sleep specialist because he also complains of chronic fatigue and has sleep apnea. I know that Dr Brett Fairy also specializes in sleep medicine. Please let the patient know if and why the change was made. Thank you

## 2017-05-06 ENCOUNTER — Ambulatory Visit: Payer: 59 | Admitting: Family Medicine

## 2017-05-06 ENCOUNTER — Encounter: Payer: Self-pay | Admitting: Family Medicine

## 2017-05-06 VITALS — BP 132/78 | HR 105 | Temp 98.6°F | Ht 72.0 in | Wt 261.4 lb

## 2017-05-06 DIAGNOSIS — H66001 Acute suppurative otitis media without spontaneous rupture of ear drum, right ear: Secondary | ICD-10-CM | POA: Diagnosis not present

## 2017-05-06 DIAGNOSIS — G4733 Obstructive sleep apnea (adult) (pediatric): Secondary | ICD-10-CM | POA: Diagnosis not present

## 2017-05-06 MED ORDER — AMOXICILLIN 875 MG PO TABS: 875.0000 mg | ORAL_TABLET | Freq: Two times a day (BID) | ORAL | 0 refills | Status: DC

## 2017-05-06 MED ORDER — METHYLPREDNISOLONE ACETATE 80 MG/ML IJ SUSP
80.0000 mg | Freq: Once | INTRAMUSCULAR | Status: AC
  Administered 2017-05-06: 80 mg via INTRAMUSCULAR

## 2017-05-06 MED ORDER — IPRATROPIUM BROMIDE 0.06 % NA SOLN: 2.0000 | Freq: Four times a day (QID) | NASAL | 0 refills | Status: DC

## 2017-05-06 NOTE — Progress Notes (Signed)
    Subjective:  Wayne Wise is a 37 y.o. male who presents today for same-day appointment with a chief complaint of sinus congestion.   HPI:  Sinus Congestion, Acute Issue Started 3-4 days ago.  Associated symptoms include cough, rhinorrhea, sore throat, and ear pain.  Ear pain was very severe last night.  No fevers or chills.  Has tried taking TheraFlu which has not helped.  His 22-year-old child has been sick with similar symptoms.  Overall, symptoms have worsened over the last day.  Symptoms are worse at night.  ROS: Per HPI  PMH: He reports that he has never smoked. He has quit using smokeless tobacco. He reports that he drinks alcohol. He reports that he does not use drugs.  Objective:  Physical Exam: BP 132/78 (BP Location: Left Arm, Patient Position: Sitting, Cuff Size: Normal)   Pulse (!) 105   Temp 98.6 F (37 C) (Oral)   Ht 6' (1.829 m)   Wt 261 lb 6.4 oz (118.6 kg)   SpO2 97%   BMI 35.45 kg/m   Gen: NAD, resting comfortably HEENT: Right TM erythematous and bulging.  Left TM with clear effusion.  Oropharynx slightly erythematous without exudate. CV: RRR with no murmurs appreciated Pulm: NWOB, CTAB with no crackles, wheezes, or rhonchi  Assessment/Plan:  Right otitis media Start 1 week course of amoxicillin.  Start Atrovent for his sinus congestion and rhinorrhea.  We will give 80 mg Depo-Medrol for his ear pain and sore throat.  Encouraged good oral hydration.  Recommended Tylenol and/or Motrin as needed for low-grade fever and pain.  Return precautions reviewed.  Follow-up as needed.  Algis Greenhouse. Jerline Pain, MD 05/06/2017 2:30 PM

## 2017-05-06 NOTE — Patient Instructions (Signed)
You have an ear infection.   Start the atrovent and amoxicilin.   Please stay well hydrated.  You can take tylenol and/or motrin as needed for low grade fever and pain.  Please let me know if your symptoms worsen or fail to improve.  Take care, Dr Jerline Pain

## 2017-05-06 NOTE — Telephone Encounter (Signed)
Spoke with Wayne Wise a referral coordinator over at [facility] and she stated that if you are wanting pt to have a sleep consult as well as a neurology visit then that will need to be 2 separate referrals. She also made mention that Dr. Jannifer Franklin himself can make a referral to Dr. Brett Fairy or the other sleep specialist in their office if he feels it necessary. Please advise if you want pt to just keep appointment with [facility]. Thanks

## 2017-05-06 NOTE — Telephone Encounter (Signed)
Yes we will keep appointment with Jannifer Franklin for now, will determine if additional referrals are needed after he sees Dr Jannifer Franklin. Thanks.

## 2017-05-07 ENCOUNTER — Ambulatory Visit: Payer: Self-pay | Admitting: *Deleted

## 2017-05-07 NOTE — Telephone Encounter (Signed)
Spoke with patient's wife.  Explained that patient could come in for appointment with Dr. Juleen China this afternoon if he feels he needs appointment.  Dr. Jerline Pain is out of the office for the afternoon.  Explained that appointment is not absolutely necessary, but can provide reassurance and possible additional measures for pain control.  Advised that patient's infection is covered at this time by oral antibiotics and he should continue those.  Patient stated that he did not wish to come in for appointment today.  Asked that Dr. Jerline Pain be notified first thing tomorrow and provide any needed suggestions for more medications (if needed) for pain and/or infection.  I advised that I will pass along the message to Dr. Jerline Pain and call patient back tomorrow with any additional information from Dr. Jerline Pain.  Advised them to contact our office immediately if patient develops worsening severe pain or high fever.  Advised that patient could alternate Tylenol and ibuprofen for pain control and to continue antibiotic as prescribed.  Patient and wife verbalized understanding.  Please advise.

## 2017-05-07 NOTE — Telephone Encounter (Signed)
Pt's wife called and stated her husband's ear ruptured and now he can not hear. He is having some pain and unable to do regular activities. He was seen in the office by Dr. Jerline Pain yesterday and placed on antibiotics. Denies fever or n/v. She is calling because she wants to know what else can be done for him. No appropriate protocol found. [facility] at [facility] notified and will contact a provider regarding management. Home care advice given to patient and wife with verbal understanding.  Answer Assessment - Initial Assessment Questions 1. LOCATION: "Which ear is involved?"     Right ear 2. ONSET: "When did the ear start hurting"      Monday night 3. SEVERITY: "How bad is the pain?"  (Scale 1-10; mild, moderate or severe)   - MILD (1-3): doesn't interfere with normal activities    - MODERATE (4-7): interferes with normal activities or awakens from sleep    - SEVERE (8-10): excruciating pain, unable to do any normal activities      # 5 4. URI SYMPTOMS: " Do you have a runny nose or cough?"     Runny nose and cough 5. FEVER: "Do you have a fever?" If so, ask: "What is your temperature, how was it measured, and when did it start?"     no 6. CAUSE: "Have you been swimming recently?", "How often do you use Q-TIPS?", "Have you had any recent air travel or scuba diving?"     A upper respiratory 7. OTHER SYMPTOMS: "Do you have any other symptoms?" (e.g., headache, stiff neck, dizziness, vomiting, runny nose, decreased hearing)     Can not hear at all 8. PREGNANCY: "Is there any chance you are pregnant?" "When was your last menstrual period?"     no  Protocols used: EARACHE-A-AH

## 2017-05-07 NOTE — Telephone Encounter (Signed)
See note

## 2017-05-08 NOTE — Telephone Encounter (Signed)
Patient can try over the counter afrin for his nasal congestion as well - should limit this to 3 days only. He can also try over the counter pseudoephedrine as well.   Algis Greenhouse. Jerline Pain, MD 05/08/2017 12:10 PM

## 2017-05-08 NOTE — Telephone Encounter (Signed)
Spoke with patient.  He states his ear hurts, but it is "nothing he can't handle".  He is taking his antibiotics.  Has some ringing in his ear now.  He is aware to avoid getting water in his ear and states he already has ear plugs and will wear them in the shower.  He will schedule a follow up with Dr. Jerline Pain or his PCP in a couple of weeks to have his ear checked.  He states his nasal congestion is not improving.  Very difficult to breathe through his nose.  Much worse at night.  Atrovent is not helping.  He is asking if there is something else that can be sent in to help with the congestion so he can get some relief.  Please advise.

## 2017-05-08 NOTE — Telephone Encounter (Signed)
No other specific treatment needed. It should heal spontaneously. He should continue the antibiotics.  He should also avoid getting water in his ear until the rupture heals. This means no swimming and using earplugs while showering.  He should follow up with me or his PCP in 2-3 weeks to make sure that it is healing.  Algis Greenhouse. Jerline Pain, MD 05/08/2017 8:22 AM

## 2017-05-09 ENCOUNTER — Ambulatory Visit: Payer: 59 | Admitting: Family

## 2017-05-09 ENCOUNTER — Encounter: Payer: Self-pay | Admitting: Family

## 2017-05-09 VITALS — BP 126/72 | HR 103 | Temp 99.0°F | Ht 72.0 in | Wt 260.0 lb

## 2017-05-09 DIAGNOSIS — H6593 Unspecified nonsuppurative otitis media, bilateral: Secondary | ICD-10-CM | POA: Diagnosis not present

## 2017-05-09 MED ORDER — LEVOFLOXACIN 500 MG PO TABS: 500.0000 mg | ORAL_TABLET | Freq: Every day | ORAL | 0 refills | Status: DC

## 2017-05-09 MED ORDER — PREDNISONE 20 MG PO TABS: 40.0000 mg | ORAL_TABLET | Freq: Every day | ORAL | 0 refills | Status: DC
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
## 2017-05-09 NOTE — Telephone Encounter (Signed)
Called pt with Dr Marigene Ehlers message and he made an appointment with another [facility]

## 2017-05-20 ENCOUNTER — Encounter: Payer: Self-pay | Admitting: Internal Medicine

## 2017-05-20 ENCOUNTER — Ambulatory Visit: Payer: 59 | Admitting: Internal Medicine

## 2017-05-20 VITALS — BP 134/70 | HR 95 | Temp 98.1°F | Resp 16 | Ht 72.0 in | Wt 257.2 lb

## 2017-05-20 DIAGNOSIS — H6591 Unspecified nonsuppurative otitis media, right ear: Secondary | ICD-10-CM | POA: Insufficient documentation

## 2017-05-20 DIAGNOSIS — H9191 Unspecified hearing loss, right ear: Secondary | ICD-10-CM | POA: Diagnosis not present

## 2017-05-20 NOTE — Patient Instructions (Signed)

## 2017-05-20 NOTE — Progress Notes (Signed)
Subjective:  Patient ID: Wayne Wise, male    DOB: 1980-10-03  Age: 37 y.o. MRN: [id number]  CC: Hearing Loss and URI   HPI Wayne Wise presents for f/up - He has been treated recently for URI by other providers.  He has taken a course of antibiotics and steroids.  His only remaining symptom involves the right ear.  He has ringing in the ears and a decreased level of hearing on the right side.  Outpatient Medications Prior to Visit  Medication Sig Dispense Refill  . amLODipine (NORVASC) 10 MG tablet Take 1 tablet (10 mg total) by mouth daily. Needs office visit for more refills 90 tablet 3  . losartan-hydrochlorothiazide (HYZAAR) 100-25 MG tablet Take 1 tablet by mouth daily. 30 tablet 1  . metoprolol succinate (TOPROL-XL) 25 MG 24 hr tablet Take 1 tablet (25 mg total) by mouth daily. 90 tablet 3  . amoxicillin (AMOXIL) 875 MG tablet Take 1 tablet (875 mg total) by mouth 2 (two) times daily. 14 tablet 0  . ipratropium (ATROVENT) 0.06 % nasal spray Place 2 sprays into both nostrils 4 (four) times daily. 15 mL 0  . levofloxacin (LEVAQUIN) 500 MG tablet Take 1 tablet (500 mg total) by mouth daily. 10 tablet 0  . predniSONE (DELTASONE) 20 MG tablet Take 2 tablets (40 mg total) by mouth daily with breakfast. 10 tablet 0   No facility-administered medications prior to visit.     ROS Review of Systems  Constitutional: Negative.  Negative for chills, fatigue and fever.  HENT: Positive for ear pain, hearing loss and tinnitus. Negative for congestion, ear discharge, facial swelling, postnasal drip, rhinorrhea, sinus pressure, sinus pain, sore throat, trouble swallowing and voice change.   Eyes: Negative.   Respiratory: Negative.  Negative for cough, chest tightness, shortness of breath and wheezing.   Cardiovascular: Negative for chest pain, palpitations and leg swelling.  Gastrointestinal: Negative for abdominal pain, diarrhea, nausea and vomiting.  Endocrine: Negative.   Genitourinary:  Negative.  Negative for difficulty urinating and urgency.  Musculoskeletal: Negative.  Negative for back pain and myalgias.  Skin: Negative.  Negative for color change and rash.  Allergic/Immunologic: Negative.   Neurological: Negative.  Negative for dizziness.  Hematological: Negative for adenopathy. Does not bruise/bleed easily.  Psychiatric/Behavioral: Negative.     Objective:  BP 134/70 (BP Location: Left Arm, Patient Position: Sitting, Cuff Size: Large)   Pulse 95   Temp 98.1 F (36.7 C) (Oral)   Resp 16   Ht 6' (1.829 m)   Wt 257 lb 4 oz (116.7 kg)   SpO2 96%   BMI 34.89 kg/m   BP Readings from Last 3 Encounters:  05/20/17 134/70  05/09/17 126/72  05/06/17 132/78    Wt Readings from Last 3 Encounters:  05/20/17 257 lb 4 oz (116.7 kg)  05/09/17 260 lb 0.6 oz (118 kg)  05/06/17 261 lb 6.4 oz (118.6 kg)    Physical Exam  Constitutional: He is oriented to person, place, and time. No distress.  HENT:  Right Ear: External ear and ear canal normal. No drainage, swelling or tenderness. No foreign bodies. No mastoid tenderness. Tympanic membrane is not injected, not scarred, not perforated, not erythematous, not retracted and not bulging. Tympanic membrane mobility is normal. A middle ear effusion (serous effusion) is present. No hemotympanum. Decreased hearing is noted.  Left Ear: Hearing, tympanic membrane, external ear and ear canal normal.  Nose: Nose normal. No mucosal edema or rhinorrhea. Right sinus exhibits  no maxillary sinus tenderness and no frontal sinus tenderness. Left sinus exhibits no maxillary sinus tenderness and no frontal sinus tenderness.  Mouth/Throat: No oropharyngeal exudate.  Neck: Normal range of motion. Neck supple. No JVD present. No thyromegaly present.  Cardiovascular: Normal rate, regular rhythm and normal heart sounds. Exam reveals no gallop and no friction rub.  No murmur heard. Pulmonary/Chest: Effort normal and breath sounds normal. No  stridor. No respiratory distress. He has no wheezes. He has no rales.  Abdominal: Bowel sounds are normal. He exhibits no distension and no mass. There is no tenderness. No hernia.  Musculoskeletal: Normal range of motion. He exhibits no edema, tenderness or deformity.  Neurological: He is alert and oriented to person, place, and time.  Skin: Skin is warm and dry. No rash noted. He is not diaphoretic.    Lab Results  Component Value Date   WBC 7.2 12/25/2016   HGB 16.8 12/25/2016   HCT 50.2 12/25/2016   PLT 231.0 12/25/2016   GLUCOSE 134 (H) 04/11/2017   CHOL 190 06/20/2016   TRIG 164.0 (H) 01/12/2014   HDL 34 (A) 06/20/2016   LDLCALC 134 (H) 01/12/2014   ALT 29 12/25/2016   AST 21 12/25/2016   NA 139 04/11/2017   K 3.9 04/11/2017   CL 100 04/11/2017   CREATININE 1.21 04/11/2017   BUN 17 04/11/2017   CO2 29 04/11/2017   TSH 1.91 12/25/2016   INR 0.89 12/15/2009   HGBA1C 5.4 04/11/2017    Dg Chest 2 View  Result Date: 04/11/2017 CLINICAL DATA:  History of hypertension EXAM: CHEST  2 VIEW COMPARISON:  08/31/2014 FINDINGS: The heart size and mediastinal contours are within normal limits. Both lungs are clear. The visualized skeletal structures are unremarkable. IMPRESSION: No active cardiopulmonary disease. Electronically Signed   By: Donavan Foil M.D.   On: 04/11/2017 19:33    Assessment & Plan:   Wayne Wise was seen today for hearing loss and uri.  Diagnoses and all orders for this visit:  Right serous otitis media, unspecified chronicity- He has a persistent middle ear effusion despite a course of steroids and antibiotics.  He has also had a significant decline in his hearing and complains of tinnitus.  He may need to undergo myringotomy to drain the fluid.  I have asked him to see ENT as soon as possible. -     Ambulatory referral to ENT  Hearing loss associated with syndrome of right ear- As above -     Ambulatory referral to ENT   I have discontinued Wayne Wise's  amoxicillin, ipratropium, levofloxacin, and predniSONE. I am also having him maintain his amLODipine, metoprolol succinate, and losartan-hydrochlorothiazide.  No orders of the defined types were placed in this encounter.    Follow-up: No follow-ups on file.  Scarlette Calico, MD

## 2017-05-23 DIAGNOSIS — H9313 Tinnitus, bilateral: Secondary | ICD-10-CM | POA: Diagnosis not present

## 2017-05-23 DIAGNOSIS — H902 Conductive hearing loss, unspecified: Secondary | ICD-10-CM | POA: Diagnosis not present

## 2017-05-23 DIAGNOSIS — H903 Sensorineural hearing loss, bilateral: Secondary | ICD-10-CM | POA: Diagnosis not present

## 2017-05-23 DIAGNOSIS — H6521 Chronic serous otitis media, right ear: Secondary | ICD-10-CM | POA: Diagnosis not present

## 2017-05-27 ENCOUNTER — Other Ambulatory Visit: Payer: Self-pay

## 2017-05-27 DIAGNOSIS — I10 Essential (primary) hypertension: Secondary | ICD-10-CM

## 2017-05-27 MED ORDER — LOSARTAN POTASSIUM-HCTZ 100-25 MG PO TABS: 1.0000 | ORAL_TABLET | Freq: Every day | ORAL | 1 refills | Status: DC

## 2017-06-02 DIAGNOSIS — G4733 Obstructive sleep apnea (adult) (pediatric): Secondary | ICD-10-CM | POA: Diagnosis not present

## 2017-07-01 ENCOUNTER — Other Ambulatory Visit: Payer: Self-pay

## 2017-07-01 ENCOUNTER — Encounter: Payer: Self-pay | Admitting: Neurology

## 2017-07-01 ENCOUNTER — Ambulatory Visit: Payer: 59 | Admitting: Neurology

## 2017-07-01 VITALS — BP 116/65 | HR 71 | Ht 72.0 in | Wt 257.5 lb

## 2017-07-01 DIAGNOSIS — G4489 Other headache syndrome: Secondary | ICD-10-CM

## 2017-07-01 DIAGNOSIS — Z87820 Personal history of traumatic brain injury: Secondary | ICD-10-CM

## 2017-07-01 DIAGNOSIS — G43109 Migraine with aura, not intractable, without status migrainosus: Secondary | ICD-10-CM | POA: Diagnosis not present

## 2017-07-01 HISTORY — DX: Other headache syndrome: G44.89

## 2017-07-01 MED ORDER — HYDROMORPHONE HCL 4 MG PO TABS: 4.0000 mg | ORAL_TABLET | Freq: Four times a day (QID) | ORAL | 0 refills | Status: DC | PRN
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
## 2017-07-02 DIAGNOSIS — G4733 Obstructive sleep apnea (adult) (pediatric): Secondary | ICD-10-CM | POA: Diagnosis not present

## 2017-07-11 ENCOUNTER — Encounter: Payer: Self-pay | Admitting: Family Medicine

## 2017-07-11 ENCOUNTER — Ambulatory Visit: Payer: 59 | Admitting: Family Medicine

## 2017-07-11 VITALS — BP 141/89 | HR 110 | Temp 98.6°F | Ht 72.0 in | Wt 260.0 lb

## 2017-07-11 DIAGNOSIS — K130 Diseases of lips: Secondary | ICD-10-CM

## 2017-07-11 NOTE — Progress Notes (Signed)
Wayne Wise - 37 y.o. male MRN [id number]  Date of birth: [birth year]  SUBJECTIVE:  Including CC & ROS.  Chief Complaint  Patient presents with  . Cyst    Wayne Wise is a 37 y.o. male that is presenting with lip on his knot. He noticed the knot yesterday. Denies pain or tenderness. Denies redness. Denies fevers or body aches. He has not applied anything to his lip. He has a history of chewing tobacco.     Review of Systems  Constitutional: Negative for fever.  HENT: Negative for congestion.   Respiratory: Negative for cough.   Cardiovascular: Negative for chest pain.    HISTORY: Past Medical, Surgical, Social, and Family History Reviewed & Updated per EMR.   Pertinent Historical Findings include:  Past Medical History:  Diagnosis Date  . Arthritis   . Chicken pox   . Headache syndrome 07/01/2017  . 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 Kidney stones   . Migraines   . TBI (traumatic brain injury) ([place]) 2011    Past Surgical History:  Procedure Laterality Date  . FRACTURE SURGERY Left 2012   Left clavicle    Allergies  Allergen Reactions  . Hydrocodone     Dizzy   . Sulfa Antibiotics Swelling  . Vitamin E Itching and Rash    Family History  Problem Relation Age of Onset  . Arthritis Mother   . Arthritis Maternal Grandmother   . Breast cancer Maternal Grandmother   . Diabetes Maternal Grandmother   . Stroke Maternal Grandfather   . Diabetes Maternal Grandfather   . Breast cancer Paternal Grandmother      Social History   Socioeconomic History  . Marital status: Married    Spouse name: Not on file  . Number of children: 1  . Years of education: 94  . Highest education level: Not on file  Occupational History  . Occupation: Quarry manager  Social Needs  . Financial resource strain: Not on file  . Food insecurity:    Worry: Not on file    Inability: Not on file  . Transportation needs:    Medical: Not on file    Non-medical: Not on file  Tobacco  Use  . Smoking status: Never Smoker  . Smokeless tobacco: Former Network engineer and Sexual Activity  . Alcohol use: Yes    Comment: occasionally  . Drug use: No  . Sexual activity: Not on file  Lifestyle  . Physical activity:    Days per week: Not on file    Minutes per session: Not on file  . Stress: Not on file  Relationships  . Social connections:    Talks on phone: Not on file    Gets together: Not on file    Attends religious service: Not on file    Active member of club or organization: Not on file    Attends meetings of clubs or organizations: Not on file    Relationship status: Not on file  . Intimate partner violence:    Fear of current or ex partner: Not on file    Emotionally abused: Not on file    Physically abused: Not on file    Forced sexual activity: Not on file  Other Topics Concern  . Not on file  Social History Narrative   Born and raised in [place], [place]. Currently resides in a private residence wife and son.    1 cat (farm outside).    Fun:  Likes to hunt.    Denies religious beliefs that effect healthcare.    Caffeine use:  Daily (coffee)     PHYSICAL EXAM:  VS: BP (!) 141/89 (BP Location: Left Arm, Patient Position: Sitting, Cuff Size: Normal)   Pulse (!) 110   Temp 98.6 F (37 C) (Oral)   Ht 6' (1.829 m)   Wt 260 lb (117.9 kg)   SpO2 97%   BMI 35.26 kg/m  Physical Exam Gen: NAD, alert, cooperative with exam, well-appearing ENT: lower lip with mobile non tender mass on the inside of lip, normal nasal mucosa,  Eye: normal EOM, normal conjunctiva and lids CV:  no edema, +2 pedal pulses   Resp: no accessory muscle use, non-labored,  Skin: no rashes, no areas of induration  Neuro: normal tone, normal sensation to touch Psych:  normal insight, alert and oriented MSK: normal gait, normal strength      ASSESSMENT & PLAN:   Mucocele of lip Likely mucocele.  - counseled on supportive care - if continues or worsen will refer to ENT

## 2017-07-11 NOTE — Patient Instructions (Signed)
Mucocele of the Mouth A mucocele is a growth or bump (cyst) that is filled with mucus. A mucocele can form on various parts of the mouth, including the gums, the tongue, and the inside of the cheeks. A common spot is inside the lower lip. Mucoceles are not dangerous, and they are usually not painful. A mucocele can be uncomfortable if it is very large or if it is located under the tongue. Small mucoceles often clear up on their own. Treatment may not be needed. Mucoceles that are bigger or that keep coming back might need to be removed. What are the causes? Mucoceles form when the salivary ducts in the mouth are damaged and leak saliva. These ducts carry saliva from the salivary glands to the surface of the mouth. A mucocele can develop because of:  An injury to your mouth.  Sucking or biting on your lips or tongue.  A blocked salivary duct. This is sometimes caused by swelling.  Piercing of the tongue or lip for jewelry.  In some cases, the cause may not be known. What are the signs or symptoms? Symptoms of this condition include a smooth, painless bumpinside your mouth. The bump may:  Show up all of a sudden.  Have thin walls and a bluish color.  Change in size. Most are smaller than  inch.  Mucoceles under the tongue are called ranulas. These may be bigger and may push your tongue up and back. In some cases, this can make it hard to talk, swallow, or breathe. How is this diagnosed? This condition is usually diagnosed with a physical exam. Your health care provider will often be able to tell if you have a mucocele by looking at it and feeling it. You may also have tests, such as:  An ultrasound to check for problems with your salivary gland.  An X-ray to see if stones are blocking the exit of saliva.  How is this treated? Treatment may depend on the size of the mucocele:  For a small mucocele, treatment is usually not needed. It will drain on its own and go away.  For larger  mucoceles and ranulas, surgery may be needed. This may be done if the mucocele does not go away or if it keeps coming back. The entire mucocele may be taken out. In some cases, the salivary gland may also be removed.  Follow these instructions at home:  Take over-the-counter and prescription medicines only as told by your health care provider.  Do not try to drain a mucocele on your own. Do not poke a hole in it.  Do not use any tobacco products, such as cigarettes, chewing tobacco, and e-cigarettes. If you need help quitting, ask your health care provider.  Do not suck or bite on your lips or tongue.  If your mucocele was removed, avoid hard or spicy foods and foods that have high acidity while your mouth is healing.  Keep all follow-up visits as told by your health care provider. This is important. Contact a health care provider if:  You have a lump or cyst inside your mouth that does not go away.  You have a fever. Get help right away if:  You have a lump or cyst in your mouth that: ? Becomes painful. ? Gets large very fast.  You have a lump or cyst in your mouth that makes it hard to: ? Swallow. ? Talk. ? Breathe. This information is not intended to replace advice given to you by your   health care provider. Make sure you discuss any questions you have with your health care provider. Document Released: 02/24/2015 Document Revised: 07/06/2015 Document Reviewed: 04/25/2014 Elsevier Interactive Patient Education  2018 [employer]  

## 2017-07-13 DIAGNOSIS — K130 Diseases of lips: Secondary | ICD-10-CM | POA: Insufficient documentation

## 2017-07-13 NOTE — Assessment & Plan Note (Signed)
Likely mucocele.  - counseled on supportive care - if continues or worsen will refer to ENT

## 2017-08-04 DIAGNOSIS — G4733 Obstructive sleep apnea (adult) (pediatric): Secondary | ICD-10-CM | POA: Diagnosis not present

## 2017-09-01 DIAGNOSIS — G4733 Obstructive sleep apnea (adult) (pediatric): Secondary | ICD-10-CM | POA: Diagnosis not present

## 2017-09-15 ENCOUNTER — Encounter: Payer: Self-pay | Admitting: Family

## 2017-09-15 ENCOUNTER — Ambulatory Visit: Payer: 59 | Admitting: Family

## 2017-09-15 VITALS — BP 132/78 | HR 95 | Temp 98.5°F | Ht 72.0 in | Wt 260.0 lb

## 2017-09-15 DIAGNOSIS — L239 Allergic contact dermatitis, unspecified cause: Secondary | ICD-10-CM | POA: Diagnosis not present

## 2017-09-15 MED ORDER — RANITIDINE HCL 150 MG PO TABS: 150.0000 mg | ORAL_TABLET | Freq: Two times a day (BID) | ORAL | 0 refills | Status: DC

## 2017-09-15 MED ORDER — METHYLPREDNISOLONE ACETATE 80 MG/ML IJ SUSP
80.0000 mg | Freq: Once | INTRAMUSCULAR | Status: AC
  Administered 2017-09-15: 80 mg via INTRAMUSCULAR

## 2017-09-15 MED ORDER — METHYLPREDNISOLONE 4 MG PO TBPK: ORAL_TABLET | ORAL | 0 refills | Status: DC
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
## 2017-09-15 NOTE — Patient Instructions (Signed)
Start the oral steroids tomorrow; start Claritin ( Loratidine) 10 mg daily and Zantac twice a day;

## 2017-09-19 DIAGNOSIS — R4020 Unspecified coma: Secondary | ICD-10-CM | POA: Diagnosis not present

## 2017-09-19 DIAGNOSIS — R569 Unspecified convulsions: Secondary | ICD-10-CM | POA: Diagnosis not present

## 2017-11-03 DIAGNOSIS — G4733 Obstructive sleep apnea (adult) (pediatric): Secondary | ICD-10-CM | POA: Diagnosis not present

## 2017-11-28 ENCOUNTER — Ambulatory Visit: Payer: Self-pay

## 2017-11-28 ENCOUNTER — Other Ambulatory Visit: Payer: Self-pay | Admitting: Occupational Medicine

## 2017-11-28 DIAGNOSIS — M79642 Pain in left hand: Secondary | ICD-10-CM

## 2017-11-28 DIAGNOSIS — M79641 Pain in right hand: Secondary | ICD-10-CM

## 2017-12-01 DIAGNOSIS — G4733 Obstructive sleep apnea (adult) (pediatric): Secondary | ICD-10-CM | POA: Diagnosis not present

## 2017-12-30 DIAGNOSIS — G4733 Obstructive sleep apnea (adult) (pediatric): Secondary | ICD-10-CM | POA: Diagnosis not present

## 2018-01-01 ENCOUNTER — Encounter: Payer: Self-pay | Admitting: Neurology

## 2018-01-01 ENCOUNTER — Ambulatory Visit: Payer: 59 | Admitting: Neurology

## 2018-01-01 VITALS — BP 140/88 | HR 68 | Ht 72.0 in | Wt 260.0 lb

## 2018-01-01 DIAGNOSIS — G43109 Migraine with aura, not intractable, without status migrainosus: Secondary | ICD-10-CM

## 2018-01-01 DIAGNOSIS — S069X3S Unspecified intracranial injury with loss of consciousness of 1 hour to 5 hours 59 minutes, sequela: Secondary | ICD-10-CM

## 2018-01-01 MED ORDER — HYDROMORPHONE HCL 4 MG PO TABS: 4.0000 mg | ORAL_TABLET | Freq: Four times a day (QID) | ORAL | 0 refills | Status: DC | PRN

## 2018-01-01 NOTE — H&P (View-Only) (Signed)
Reason for visit: Migraine headache, history of traumatic brain injury  Wayne Wise is an 37 y.o. male  History of present illness:  Wayne Wise is a 37 year old right-handed white male with a history of chronic daily headaches.  The patient will have severe headaches 2 or 3 times a month, he has Dilaudid to take for this.  He does not wish to take any other pills or do any injections for his headache.  He is quite satisfied with the current drug regimen.  He was given 30 tablets of Dilaudid 6 months ago, he still has medication left over.  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  Past Medical History:  Diagnosis Date  . Arthritis   . Chicken pox   . Headache syndrome 07/01/2017  . 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 Kidney stones   . Migraines   . TBI (traumatic brain injury) ([place]) 2011    Past Surgical History:  Procedure Laterality Date  . FRACTURE SURGERY Left 2012   Left clavicle    Family History  Problem Relation Age of Onset  . Arthritis Mother   . Arthritis Maternal Grandmother   . Breast cancer Maternal Grandmother   . Diabetes Maternal Grandmother   . Stroke Maternal Grandfather   . Diabetes Maternal Grandfather   . Breast cancer Paternal Grandmother     Social history:  reports that he has never smoked. He has quit using smokeless tobacco. He reports that he drinks alcohol. He reports that he does not use drugs.    Allergies  Allergen Reactions  . Hydrocodone     Dizzy   . Sulfa Antibiotics Swelling    Medications:  Prior to Admission medications   Medication Sig Start Date End Date Taking? Authorizing Provider  diclofenac sodium (VOLTAREN) 1 % GEL Voltaren 1 % topical gel  APPLY 4 GRAM TO THE AFFECTED AREA(S) BY TOPICAL ROUTE 4 TIMES PER DAY   Yes [provider]  HYDROmorphone (DILAUDID) 4 MG tablet Take 1 tablet (4 mg total) by mouth every 6 (six) hours as needed for severe pain. 07/01/17  Yes  Kathrynn Ducking, MD  losartan-hydrochlorothiazide (HYZAAR) 100-25 MG tablet Take 1 tablet by mouth daily. 05/27/17  Yes Lance Sell, NP    ROS:  Out of a complete 14 system review of symptoms, the patient complains only of the following symptoms, and all other reviewed systems are negative.  Headache  Blood pressure 140/88, pulse 68, height 6' (1.829 m), weight 260 lb (117.9 kg).  Physical Exam  General: The patient is alert and cooperative at the time of the examination.  Skin: No significant peripheral edema is noted.   Neurologic Exam  Mental status: The patient is alert and oriented x 3 at the time of the examination. The patient has apparent normal recent and remote memory, with an apparently normal attention span and concentration ability.   Cranial nerves: Facial symmetry is present. Speech is normal, no aphasia or dysarthria is noted. Extraocular movements are full. Visual fields are full.  Motor: The patient has good strength in all 4 extremities.  Sensory examination: Soft touch sensation is symmetric on the face, arms, and legs.  Coordination: The patient has good finger-nose-finger and heel-to-shin bilaterally.  Gait and station: The patient has a normal gait. Tandem gait is normal. Romberg is negative. No drift is seen.  Reflexes: Deep tendon reflexes are symmetric.   Assessment/Plan:  1.  Intractable migraine headache  2.  History of traumatic brain injury  The patient will continue his Dilaudid if needed, he is taking very few tablets.  Another prescription was given, he will follow-up in 1 year, sooner if needed.  Jill Alexanders MD 01/01/2018 7:53 AM  [facility] [address] [place], [place] [address]  Phone [phone] Fax [phone]

## 2018-01-01 NOTE — Progress Notes (Signed)
Reason for visit: Migraine headache, history of traumatic brain injury  Wayne Wise is an 37 y.o. male  History of present illness:  Wayne Wise is a 37 year old right-handed white male with a history of chronic daily headaches.  The patient will have severe headaches 2 or 3 times a month, he has Dilaudid to take for this.  He does not wish to take any other pills or do any injections for his headache.  He is quite satisfied with the current drug regimen.  He was given 30 tablets of Dilaudid 6 months ago, he still has medication left over.  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  Past Medical History:  Diagnosis Date  . Arthritis   . Chicken pox   . Headache syndrome 07/01/2017  . 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 Kidney stones   . Migraines   . TBI (traumatic brain injury) ([place]) 2011    Past Surgical History:  Procedure Laterality Date  . FRACTURE SURGERY Left 2012   Left clavicle    Family History  Problem Relation Age of Onset  . Arthritis Mother   . Arthritis Maternal Grandmother   . Breast cancer Maternal Grandmother   . Diabetes Maternal Grandmother   . Stroke Maternal Grandfather   . Diabetes Maternal Grandfather   . Breast cancer Paternal Grandmother     Social history:  reports that he has never smoked. He has quit using smokeless tobacco. He reports that he drinks alcohol. He reports that he does not use drugs.    Allergies  Allergen Reactions  . Hydrocodone     Dizzy   . Sulfa Antibiotics Swelling    Medications:  Prior to Admission medications   Medication Sig Start Date End Date Taking? Authorizing Provider  diclofenac sodium (VOLTAREN) 1 % GEL Voltaren 1 % topical gel  APPLY 4 GRAM TO THE AFFECTED AREA(S) BY TOPICAL ROUTE 4 TIMES PER DAY   Yes [provider]  HYDROmorphone (DILAUDID) 4 MG tablet Take 1 tablet (4 mg total) by mouth every 6 (six) hours as needed for severe pain. 07/01/17  Yes  Kathrynn Ducking, MD  losartan-hydrochlorothiazide (HYZAAR) 100-25 MG tablet Take 1 tablet by mouth daily. 05/27/17  Yes Lance Sell, NP    ROS:  Out of a complete 14 system review of symptoms, the patient complains only of the following symptoms, and all other reviewed systems are negative.  Headache  Blood pressure 140/88, pulse 68, height 6' (1.829 m), weight 260 lb (117.9 kg).  Physical Exam  General: The patient is alert and cooperative at the time of the examination.  Skin: No significant peripheral edema is noted.   Neurologic Exam  Mental status: The patient is alert and oriented x 3 at the time of the examination. The patient has apparent normal recent and remote memory, with an apparently normal attention span and concentration ability.   Cranial nerves: Facial symmetry is present. Speech is normal, no aphasia or dysarthria is noted. Extraocular movements are full. Visual fields are full.  Motor: The patient has good strength in all 4 extremities.  Sensory examination: Soft touch sensation is symmetric on the face, arms, and legs.  Coordination: The patient has good finger-nose-finger and heel-to-shin bilaterally.  Gait and station: The patient has a normal gait. Tandem gait is normal. Romberg is negative. No drift is seen.  Reflexes: Deep tendon reflexes are symmetric.   Assessment/Plan:  1.  Intractable migraine headache  2.  History of traumatic brain injury  The patient will continue his Dilaudid if needed, he is taking very few tablets.  Another prescription was given, he will follow-up in 1 year, sooner if needed.  Jill Alexanders MD 01/01/2018 7:53 AM  [facility] [address] [place], [place] [address]  Phone [phone] Fax [phone]

## 2018-01-06 ENCOUNTER — Encounter (HOSPITAL_BASED_OUTPATIENT_CLINIC_OR_DEPARTMENT_OTHER): Payer: Self-pay | Admitting: *Deleted

## 2018-01-06 ENCOUNTER — Other Ambulatory Visit: Payer: Self-pay

## 2018-01-06 NOTE — Progress Notes (Signed)
Need orders in epic for 01-13-18 surgery

## 2018-01-06 NOTE — H&P (View-Only) (Signed)
Need orders in epic for 01-13-18 surgery

## 2018-01-13 ENCOUNTER — Ambulatory Visit: Payer: Self-pay | Admitting: Orthopedic Surgery

## 2018-01-13 ENCOUNTER — Ambulatory Visit (HOSPITAL_BASED_OUTPATIENT_CLINIC_OR_DEPARTMENT_OTHER): Payer: No Typology Code available for payment source | Admitting: Anesthesiology

## 2018-01-13 ENCOUNTER — Encounter (HOSPITAL_COMMUNITY)
Admission: RE | Disposition: A | Discharge status: Home / Self Care | Payer: Self-pay | Source: Ambulatory Visit | Attending: Specialist

## 2018-01-13 ENCOUNTER — Ambulatory Visit (HOSPITAL_BASED_OUTPATIENT_CLINIC_OR_DEPARTMENT_OTHER)
Admission: RE | Admit: 2018-01-13 | Discharge: 2018-01-13 | Disposition: A | Discharge status: Home / Self Care | Payer: No Typology Code available for payment source | Source: Ambulatory Visit | Attending: Specialist | Admitting: Specialist

## 2018-01-13 ENCOUNTER — Other Ambulatory Visit: Payer: Self-pay

## 2018-01-13 ENCOUNTER — Encounter (HOSPITAL_COMMUNITY): Payer: Self-pay | Admitting: Emergency Medicine

## 2018-01-13 DIAGNOSIS — Z6834 Body mass index (BMI) 34.0-34.9, adult: Secondary | ICD-10-CM | POA: Diagnosis not present

## 2018-01-13 DIAGNOSIS — E669 Obesity, unspecified: Secondary | ICD-10-CM | POA: Diagnosis not present

## 2018-01-13 DIAGNOSIS — G4730 Sleep apnea, unspecified: Secondary | ICD-10-CM | POA: Insufficient documentation

## 2018-01-13 DIAGNOSIS — M7652 Patellar tendinitis, left knee: Secondary | ICD-10-CM | POA: Insufficient documentation

## 2018-01-13 DIAGNOSIS — M1990 Unspecified osteoarthritis, unspecified site: Secondary | ICD-10-CM | POA: Diagnosis not present

## 2018-01-13 DIAGNOSIS — G43919 Migraine, unspecified, intractable, without status migrainosus: Secondary | ICD-10-CM | POA: Insufficient documentation

## 2018-01-13 DIAGNOSIS — Z885 Allergy status to narcotic agent status: Secondary | ICD-10-CM | POA: Diagnosis not present

## 2018-01-13 DIAGNOSIS — X58XXXA Exposure to other specified factors, initial encounter: Secondary | ICD-10-CM | POA: Insufficient documentation

## 2018-01-13 DIAGNOSIS — Z87442 Personal history of urinary calculi: Secondary | ICD-10-CM | POA: Diagnosis not present

## 2018-01-13 DIAGNOSIS — Z79899 Other long term (current) drug therapy: Secondary | ICD-10-CM | POA: Diagnosis not present

## 2018-01-13 DIAGNOSIS — Z882 Allergy status to sulfonamides status: Secondary | ICD-10-CM | POA: Diagnosis not present

## 2018-01-13 DIAGNOSIS — Z9889 Other specified postprocedural states: Secondary | ICD-10-CM

## 2018-01-13 DIAGNOSIS — I10 Essential (primary) hypertension: Secondary | ICD-10-CM | POA: Diagnosis not present

## 2018-01-13 DIAGNOSIS — Z87820 Personal history of traumatic brain injury: Secondary | ICD-10-CM | POA: Diagnosis not present

## 2018-01-13 DIAGNOSIS — S83242A Other tear of medial meniscus, current injury, left knee, initial encounter: Secondary | ICD-10-CM | POA: Insufficient documentation

## 2018-01-13 HISTORY — DX: Sleep apnea, unspecified: G47.30

## 2018-01-13 HISTORY — PX: KNEE ARTHROSCOPY WITH MEDIAL MENISECTOMY: SHX5651

## 2018-01-13 HISTORY — DX: Essential (primary) hypertension: I10

## 2018-01-13 HISTORY — DX: Personal history of urinary calculi: Z87.442

## 2018-01-13 LAB — BASIC METABOLIC PANEL
ANION GAP: 10 (ref 5–15)
BUN: 16 mg/dL (ref 6–20)
CALCIUM: 9.1 mg/dL (ref 8.9–10.3)
CO2: 24 mmol/L (ref 22–32)
Chloride: 105 mmol/L (ref 98–111)
Creatinine, Ser: 0.84 mg/dL (ref 0.61–1.24)
GFR calc non Af Amer: >60 mL/min (ref >60)
GLUCOSE: 87 mg/dL (ref 70–99)
POTASSIUM: 3.5 mmol/L (ref 3.5–5.1)
Sodium: 139 mmol/L (ref 135–145)

## 2018-01-13 LAB — CBC
HCT: 47.4 % (ref 39.0–52.0)
Hemoglobin: 15.6 g/dL (ref 13.0–17.0)
MCH: 31.6 pg (ref 26.0–34.0)
MCHC: 32.9 g/dL (ref 30.0–36.0)
MCV: 96.1 fL (ref 80.0–100.0)
NRBC: 0 % (ref 0.0–0.2)
Platelets: 248 10*3/uL (ref 150–400)
RBC: 4.93 MIL/uL (ref 4.22–5.81)
RDW: 11.9 % (ref 11.5–15.5)
WBC: 7.6 10*3/uL (ref 4.0–10.5)

## 2018-01-13 SURGERY — ARTHROSCOPY, KNEE, WITH MEDIAL MENISCECTOMY
Anesthesia: General | Laterality: Left

## 2018-01-13 MED ORDER — OXYCODONE HCL 5 MG PO TABS
ORAL_TABLET | ORAL | Status: AC
  Filled 2018-01-13: qty 1

## 2018-01-13 MED ORDER — MIDAZOLAM HCL 5 MG/5ML IJ SOLN
INTRAMUSCULAR | Status: DC | PRN
  Administered 2018-01-13: 2 mg via INTRAVENOUS

## 2018-01-13 MED ORDER — DEXAMETHASONE SODIUM PHOSPHATE 10 MG/ML IJ SOLN
INTRAMUSCULAR | Status: DC | PRN
  Administered 2018-01-13: 10 mg via INTRAVENOUS

## 2018-01-13 MED ORDER — LACTATED RINGERS IV SOLN
INTRAVENOUS | Status: DC
  Administered 2018-01-13: 13:00:00 via INTRAVENOUS

## 2018-01-13 MED ORDER — MORPHINE SULFATE (PF) 4 MG/ML IV SOLN
INTRAVENOUS | Status: AC
  Filled 2018-01-13: qty 1

## 2018-01-13 MED ORDER — HYDROMORPHONE HCL 4 MG PO TABS: 4.0000 mg | ORAL_TABLET | ORAL | 0 refills | Status: AC | PRN

## 2018-01-13 MED ORDER — BUPIVACAINE HCL 0.25 % IJ SOLN
INTRAMUSCULAR | Status: DC | PRN
  Administered 2018-01-13: 30 mL

## 2018-01-13 MED ORDER — HYDROMORPHONE HCL 1 MG/ML IJ SOLN
1.0000 mg | Freq: Once | INTRAMUSCULAR | Status: AC
  Administered 2018-01-13: 1 mg via INTRAVENOUS

## 2018-01-13 MED ORDER — ONDANSETRON HCL 4 MG/2ML IJ SOLN
4.0000 mg | Freq: Once | INTRAMUSCULAR | Status: AC | PRN
  Administered 2018-01-13: 4 mg via INTRAVENOUS

## 2018-01-13 MED ORDER — PROPOFOL 10 MG/ML IV BOLUS
INTRAVENOUS | Status: AC
  Filled 2018-01-13: qty 40

## 2018-01-13 MED ORDER — MIDAZOLAM HCL 2 MG/2ML IJ SOLN
INTRAMUSCULAR | Status: AC
  Filled 2018-01-13: qty 2

## 2018-01-13 MED ORDER — CEFAZOLIN SODIUM-DEXTROSE 2-4 GM/100ML-% IV SOLN
2.0000 g | INTRAVENOUS | Status: AC
  Administered 2018-01-13: 2 g via INTRAVENOUS
  Filled 2018-01-13: qty 100

## 2018-01-13 MED ORDER — BUPIVACAINE HCL (PF) 0.25 % IJ SOLN
INTRAMUSCULAR | Status: AC
  Filled 2018-01-13: qty 30

## 2018-01-13 MED ORDER — CEPHALEXIN 500 MG PO CAPS: 500.0000 mg | ORAL_CAPSULE | Freq: Four times a day (QID) | ORAL | 0 refills | Status: AC

## 2018-01-13 MED ORDER — MORPHINE SULFATE (PF) 4 MG/ML IV SOLN
INTRAVENOUS | Status: DC | PRN
  Administered 2018-01-13: 4 mg

## 2018-01-13 MED ORDER — DEXMEDETOMIDINE HCL IN NACL 200 MCG/50ML IV SOLN
INTRAVENOUS | Status: AC
  Filled 2018-01-13: qty 50

## 2018-01-13 MED ORDER — FENTANYL CITRATE (PF) 100 MCG/2ML IJ SOLN
INTRAMUSCULAR | Status: AC
  Filled 2018-01-13: qty 2

## 2018-01-13 MED ORDER — ONDANSETRON HCL 4 MG/2ML IJ SOLN
INTRAMUSCULAR | Status: AC
  Filled 2018-01-13: qty 2

## 2018-01-13 MED ORDER — SODIUM CHLORIDE 0.9 % IR SOLN
Status: DC | PRN
  Administered 2018-01-13: 6000 mL

## 2018-01-13 MED ORDER — HYDROMORPHONE HCL 1 MG/ML IJ SOLN
INTRAMUSCULAR | Status: AC
  Filled 2018-01-13: qty 1

## 2018-01-13 MED ORDER — DEXAMETHASONE SODIUM PHOSPHATE 10 MG/ML IJ SOLN
INTRAMUSCULAR | Status: AC
  Filled 2018-01-13: qty 1

## 2018-01-13 MED ORDER — ONDANSETRON HCL 4 MG/2ML IJ SOLN
INTRAMUSCULAR | Status: DC | PRN
  Administered 2018-01-13: 4 mg via INTRAVENOUS

## 2018-01-13 MED ORDER — FENTANYL CITRATE (PF) 100 MCG/2ML IJ SOLN
INTRAMUSCULAR | Status: AC
  Filled 2018-01-13: qty 4

## 2018-01-13 MED ORDER — DEXMEDETOMIDINE HCL 200 MCG/2ML IV SOLN
INTRAVENOUS | Status: DC | PRN
  Administered 2018-01-13 (×3): 4 ug via INTRAVENOUS

## 2018-01-13 MED ORDER — FENTANYL CITRATE (PF) 100 MCG/2ML IJ SOLN
INTRAMUSCULAR | Status: DC | PRN
  Administered 2018-01-13: 25 ug via INTRAVENOUS
  Administered 2018-01-13: 50 ug via INTRAVENOUS
  Administered 2018-01-13: 100 ug via INTRAVENOUS
  Administered 2018-01-13: 25 ug via INTRAVENOUS

## 2018-01-13 MED ORDER — CHLORHEXIDINE GLUCONATE 4 % EX LIQD: 60.0000 mL | Freq: Once | CUTANEOUS | Status: DC

## 2018-01-13 MED ORDER — FENTANYL CITRATE (PF) 100 MCG/2ML IJ SOLN
25.0000 ug | INTRAMUSCULAR | Status: DC | PRN
  Administered 2018-01-13 (×2): 50 ug via INTRAVENOUS

## 2018-01-13 MED ORDER — ASPIRIN EC 325 MG PO TBEC: 325.0000 mg | DELAYED_RELEASE_TABLET | Freq: Two times a day (BID) | ORAL | 0 refills | Status: AC

## 2018-01-13 MED ORDER — OXYCODONE HCL 5 MG PO TABS: 5.0000 mg | ORAL_TABLET | Freq: Once | ORAL | Status: DC

## 2018-01-13 MED ORDER — LIDOCAINE HCL 1 % IJ SOLN
INTRAMUSCULAR | Status: DC | PRN
  Administered 2018-01-13: 100 mg via INTRADERMAL

## 2018-01-13 MED ORDER — PROPOFOL 10 MG/ML IV BOLUS
INTRAVENOUS | Status: DC | PRN
  Administered 2018-01-13: 200 mg via INTRAVENOUS

## 2018-01-13 SURGICAL SUPPLY — 47 items
ANCH SUT 2-0 5 STRL LF DISP (Miscellaneous) ×1 IMPLANT
BANDAGE ESMARK 6X9 LF (GAUZE/BANDAGES/DRESSINGS) ×1 IMPLANT
BLADE CUDA GRT WHITE 3.5 (BLADE) ×2 IMPLANT
BNDG CMPR 9X6 STRL LF SNTH (GAUZE/BANDAGES/DRESSINGS) ×1
BNDG ESMARK 6X9 LF (GAUZE/BANDAGES/DRESSINGS) ×2
BNDG GAUZE ELAST 4 BULKY (GAUZE/BANDAGES/DRESSINGS) ×2 IMPLANT
CANISTER SUCTION 1200CC (MISCELLANEOUS) ×1 IMPLANT
CONT SPECI 4OZ STER CLIK (MISCELLANEOUS) IMPLANT
COVER WAND RF STERILE (DRAPES) ×2 IMPLANT
CUFF TOURN SGL QUICK 34 (TOURNIQUET CUFF) ×2
CUFF TOURNIQUET SINGLE 34IN LL (TOURNIQUET CUFF) ×1 IMPLANT
CUFF TRNQT CYL 34X4X40X1 (TOURNIQUET CUFF) ×1 IMPLANT
CUTTER TENSIONER SUT 2-0 0 FBW (INSTRUMENTS) ×1 IMPLANT
DRAPE ARTHROSCOPY W/POUCH 114 (DRAPES) ×2 IMPLANT
DRAPE INCISE IOBAN 66X45 STRL (DRAPES) ×2 IMPLANT
DRAPE U-SHAPE 47X51 STRL (DRAPES) ×2 IMPLANT
DURAPREP 26ML APPLICATOR (WOUND CARE) ×2 IMPLANT
ELECT MENISCUS 165MM 90D (ELECTRODE) IMPLANT
GAUZE SPONGE 4X4 12PLY STRL (GAUZE/BANDAGES/DRESSINGS) ×2 IMPLANT
GAUZE XEROFORM 1X8 LF (GAUZE/BANDAGES/DRESSINGS) ×2 IMPLANT
GLOVE BIO SURGEON STRL SZ7.5 (GLOVE) ×2 IMPLANT
GLOVE BIO SURGEON STRL SZ8 (GLOVE) ×2 IMPLANT
GLOVE INDICATOR 8.0 STRL GRN (GLOVE) ×4 IMPLANT
GOWN STRL REUS W/TWL XL LVL3 (GOWN DISPOSABLE) ×4 IMPLANT
IMMOBILIZER KNEE 22 UNIV (SOFTGOODS) ×1 IMPLANT
IV NS IRRIG 3000ML ARTHROMATIC (IV SOLUTION) ×4 IMPLANT
JUGGERSTITCH IMPLANT CVD (Miscellaneous) ×1 IMPLANT
KIT PLATELET ACP SERIES I (KITS) ×1 IMPLANT
KIT TURNOVER CYSTO (KITS) ×2 IMPLANT
KNEE WRAP E Z 3 GEL PACK (MISCELLANEOUS) ×2 IMPLANT
MANIFOLD NEPTUNE II (INSTRUMENTS) ×2 IMPLANT
NEEDLE HYPO 22GX1.5 SAFETY (NEEDLE) ×2 IMPLANT
PACK ARTHROSCOPY DSU (CUSTOM PROCEDURE TRAY) ×2 IMPLANT
PACK BASIN DAY SURGERY FS (CUSTOM PROCEDURE TRAY) ×2 IMPLANT
PAD ABD 8X10 STRL (GAUZE/BANDAGES/DRESSINGS) ×1 IMPLANT
PAD ARMBOARD 7.5X6 YLW CONV (MISCELLANEOUS) IMPLANT
PORTAL SKID DEVICE (INSTRUMENTS) ×1 IMPLANT
PROBE BIPOLAR 50 DEGREE SUCT (MISCELLANEOUS) ×1 IMPLANT
PROBE BIPOLAR ATHRO 135MM 90D (MISCELLANEOUS) IMPLANT
SET ARTHROSCOPY TUBING (MISCELLANEOUS) ×2
SET ARTHROSCOPY TUBING LN (MISCELLANEOUS) ×1 IMPLANT
SUT ETHILON 4 0 PS 2 18 (SUTURE) ×2 IMPLANT
SYR CONTROL 10ML LL (SYRINGE) ×2 IMPLANT
TOWEL OR 17X24 6PK STRL BLUE (TOWEL DISPOSABLE) ×4 IMPLANT
TUBE CONNECTING 12X1/4 (SUCTIONS) ×2 IMPLANT
WATER STERILE IRR 500ML POUR (IV SOLUTION) ×2 IMPLANT
WRAP KNEE MAXI GEL POST OP (GAUZE/BANDAGES/DRESSINGS) ×1 IMPLANT

## 2018-01-13 NOTE — H&P (Signed)
Wayne Wise is an 37 y.o. male.   Chief Complaint: left knee pain HPI: Patient presents with joint discomfort that had been persistent for several weeks now. Following a work injury.  Despite conservative treatments, the patients discomfort has not improved. Imaging was obtained. Other conservative and surgical treatments were discussed in detail. Patient wishes to proceed with surgery as consented. Denies SOB, CP, or calf pain. No Fever, chills, or nausea/ vomiting.   Past Medical History:  Diagnosis Date  . Arthritis   . Chicken pox as child  . Headache syndrome 07/01/2017  . 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 History of kidney stones   . Hypertension   . Migraines   . Sleep apnea    cpap autoset starts at 3  . TBI (traumatic brain injury) ([place]) 2011    Past Surgical History:  Procedure Laterality Date  . FRACTURE SURGERY Left 2012   Left clavicle  . rod removed from left clavicle  2012    Family History  Problem Relation Age of Onset  . Arthritis Mother   . Arthritis Maternal Grandmother   . Breast cancer Maternal Grandmother   . Diabetes Maternal Grandmother   . Stroke Maternal Grandfather   . Diabetes Maternal Grandfather   . Breast cancer Paternal Grandmother    Social History:  reports that he has never smoked. He has quit using smokeless tobacco. He reports that he drinks alcohol. He reports that he does not use drugs.  Allergies:  Allergies  Allergen Reactions  . Hydrocodone     Dizzy   . Poison Ivy Extract   . Sulfa Antibiotics Swelling    Swelling of fingers    Medications Prior to Admission  Medication Sig Dispense Refill  . diclofenac sodium (VOLTAREN) 1 % GEL Using bid now    . HYDROmorphone (DILAUDID) 4 MG tablet Take 1 tablet (4 mg total) by mouth every 6 (six) hours as needed for severe pain. 30 tablet 0  . vitamin E 1000 UNIT capsule Take 1,000 Units by mouth daily.    Marland Kitchen losartan-hydrochlorothiazide (HYZAAR) 100-25 MG tablet Take 1 tablet by  mouth daily. 90 tablet 1    Results for orders placed or performed during the hospital encounter of 01/13/18 (from the past 48 hour(s))  CBC     Status: None   Collection Time: 01/13/18 12:54 PM  Result Value Ref Range   WBC 7.6 4.0 - 10.5 K/uL   RBC 4.93 4.22 - 5.81 MIL/uL   Hemoglobin 15.6 13.0 - 17.0 g/dL   HCT 47.4 39.0 - 52.0 %   MCV 96.1 80.0 - 100.0 fL   MCH 31.6 26.0 - 34.0 pg   MCHC 32.9 30.0 - 36.0 g/dL   RDW 11.9 11.5 - 15.5 %   Platelets 248 150 - 400 K/uL   nRBC 0.0 0.0 - 0.2 %    Comment: Performed at [facility], [place] [address], [place], [place] [address]  Basic metabolic panel     Status: None   Collection Time: 01/13/18 12:54 PM  Result Value Ref Range   Sodium 139 135 - 145 mmol/L   Potassium 3.5 3.5 - 5.1 mmol/L   Chloride 105 98 - 111 mmol/L   CO2 24 22 - 32 mmol/L   Glucose, Bld 87 70 - 99 mg/dL   BUN 16 6 - 20 mg/dL   Creatinine, Ser 0.84 0.61 - 1.24 mg/dL   Calcium 9.1 8.9 - 10.3 mg/dL  GFR calc non Af Amer >60 >60 mL/min   GFR calc Af Amer >60 >60 mL/min   Anion gap 10 5 - 15    Comment: Performed at [facility], [place] [address], [place], [place] [address]   No results found.  Review of Systems  Constitutional: Negative.   HENT: Negative.   Eyes: Negative.   Respiratory: Negative.   Cardiovascular: Negative.   Gastrointestinal: Negative.   Genitourinary: Negative.   Musculoskeletal: Positive for joint pain.  Skin: Negative.   Neurological: Negative.   Endo/Heme/Allergies: Negative.   Psychiatric/Behavioral: Negative.     Blood pressure (!) 144/88, pulse 71, temperature 98.2 F (36.8 C), temperature source Oral, resp. rate 15, height 6' (1.829 m), weight 116.8 kg, SpO2 98 %. Physical Exam  Constitutional: He is oriented to person, place, and time. He appears well-developed.  HENT:  Head: Normocephalic.  Eyes: EOM are normal.  Neck: Normal range of motion.  Cardiovascular: Normal rate and intact  distal pulses.  Respiratory: Effort normal.  GI: Soft.  Genitourinary:  Genitourinary Comments: Deferred  Musculoskeletal:  Left knee medial joint line pain. Positive Mccmurrays .  Calf soft and non tender. Good ROm and strength.   Neurological: He is alert and oriented to person, place, and time.  Skin: Skin is warm.  Psychiatric: His behavior is normal.     Assessment/Plan Left medial TM and patella tendonitis Scope and injection as consented D/c home with wife ASA at D/c F/u in office   Lareina Espino L, PA-C 01/13/2018, 3:15 PM

## 2018-01-13 NOTE — Anesthesia Procedure Notes (Signed)
Procedure Name: LMA Insertion Date/Time: 01/13/2018 3:46 PM Performed by: Suan Halter, CRNA Pre-anesthesia Checklist: Patient identified, Emergency Drugs available, Suction available and Patient being monitored Patient Re-evaluated:Patient Re-evaluated prior to induction Oxygen Delivery Method: Circle system utilized Preoxygenation: Pre-oxygenation with 100% oxygen Induction Type: IV induction Ventilation: Mask ventilation without difficulty LMA: LMA inserted LMA Size: 5.0 Number of attempts: 1 Airway Equipment and Method: Bite block Placement Confirmation: positive ETCO2 Tube secured with: Tape Dental Injury: Teeth and Oropharynx as per pre-operative assessment

## 2018-01-13 NOTE — Op Note (Signed)
NAME: Wayne Wise, [place] MEDICAL RECORD YO:[id number] ACCOUNT [id number] DATE OF BIRTH:1980-12-11 FACILITY: [facility] LOCATION: [facility] PHYSICIAN:Neytiri Asche Gwinda Passe, MD  OPERATIVE REPORT  DATE OF PROCEDURE:  01/13/2018  PREOPERATIVE DIAGNOSES:  1.  Left knee torn medial meniscus. 2.  Patellar tendinosis.  POSTOPERATIVE DIAGNOSES:  1.  Left knee torn medial meniscus. 2.  Patellar tendinosis.  PROCEDURE: 1.  Left knee arthroscopic medial meniscal repair. 2.  PRP injection patellar tendinosis.  SURGEON:  Audree Camel. Theda Sers, MD  ASSISTANT:  Wyatt Portela PA-C.  ANESTHESIA:  General with intraoperative knee block.  ESTIMATED BLOOD LOSS:  Minimal.  DRAINS:  None.  SPECIMENS:  None.  TOURNIQUET TIME:  45 minutes at 300 mmHg.   DISPOSITION:  PACU stable.  DESCRIPTION OF PROCEDURE:  The patient's family counseled in the holding area, correct site was identified, marked and signed appropriately.  IV started sedation given.  Antibiotics were given.  Taken to the operating room and placed in supine position,  general anesthesia.  All extremities well-padded with a  bump.  Left lower extremity placed thigh holder, prepped with DuraPrep and draped in a sterile fashion.  Time-out done, confirming the left side.  Exsanguinated with an Esmarch, tourniquet inflated  to 300 mmHg.  Arthroscopic portals were established proximal inferomedial, inferolateral.  Diagnostic arthroscopy revealed intact patellofemoral joint with articular cartilage and tracking.  ACL and PCL were intact.  Lateral side inspected.  Normal  articular cartilage and lateral meniscus tears.  Medial side articulate was healthy, but there was a horizontal tear posterior horn of the medial meniscus in the red-white zone 1.5 cm with instability consistent with a knee meniscal repair.  At this  point in time, we then drew the blood from the arm, following the standard Arthrex technique.  This blood was centrifuged and then the PRP was  injected into the patellar tendon corresponding to the area of tendinosis based upon his MRI scan.  Utilizing a combination of Arthrex and Biomet, meniscal repair sutures were then placed nicely repaired the medial meniscus.  It was felt and found to be stable.  There are no other abnormalities noted.  Medial and  lateral arthroscope was removed.  So  at this point in time, we also felt that there was adequate blood supply to the knee based on soft tissue.  Knee was irrigated and arthroscope equipment was removed.  Portal closed with 1 suture.  Ten mL of Sensorcaine placed in the knee and  intraarticular.  Sterile dressing applied and the tourniquet deflated and normal circulation at the end of the case.  Ice pack was applied and knee immobilizer in full extension.    The patient tolerated procedure well.  No complications or problems.    He was taken from the operating room to the PACU in stable condition.  He will be stabilized in PACU and discharged home.  We will see him in office next week.  He will be kept on crutches, partial weightbearing for 6 weeks and then progressive  weightbearing off crutches around 8 weeks.  Hole the knee still for 3 weeks and then TROM brace and begin range of motion at 3 weeks.  All questions encouraged and answered with the family after surgery.  AN/NUANCE  D:01/13/2018 T:01/13/2018 JOB:[id number]

## 2018-01-13 NOTE — Discharge Instructions (Addendum)
DISCHARGE INSTRUCTIONS ________________________________________________________________________________  ARTHROSCOPIC KNEE SURGERY  INSTRUCTIONS FOR POST-OPERATIVE CARE  Hart Robinsons, MD [facility] [address] [place], [place]  [address] [phone]   PAIN You will be expected to have a moderate amount of pain in the affected knee for approximately two weeks.  However, the first 2-4 days will be the most severe pain.  Prescriptions have been provided to take as needed for the pain.  The pain can be markedly reduced by using ice/compressive bandage for the first 48-72 hours.  Exchange the icepacks whenever they thaw.  During the night, keep the bandage on because it will still provide some compression for the swelling.  Also, keep the leg elevated on pillows above your heart and this will help alleviate the pain and swelling.  To prevent constipation while on pain medication, take Colace 100mg  twice a day.  If you become constipated, take Miralax 17gm once a day with plenty of fluids.  ACTIVITY It is preferred that you stay at bed rest for approximately 24 hours.  However, you may go to the bathroom with help.  After this, you can start to be up and about progressively more.  Remember that the swelling may still increase after three to four days if you are up and doing too much. Do not put weight on your left leg for 6 weeks.   Use your crutches for comfort and safety.    DRESSING Keep the current dressing as dry as possible.  3 days after your surgery, you may remove the ice/compressive wrap, TED stocking and surgical dressing.  You may now take a shower, but do not scrub the wounds directly with soap.  Let water rinse over these and gently wipe with your hand.  Reapply band-aids over the puncture wounds and more gauze if needed.  A slight amount of thin drainage can be normal at this time, do not let it frighten you.  Reapply the TED hose and the  ice/compressive wrap.  You may now repeat this every day for another shower.    SYMPTOMS TO REPORT TO YOUR DOCTOR Extreme pain   Extreme swelling        Temperature above 101 that does not come down with Tylenol   Any changes in the feeling, color or movement in your toes   Redness, heat or swelling at your incision  NOTE:  The puncture wounds may be slightly red as they heal.  This is normal.  EXERCISE It is preferred that you begin to exercise on the day of your surgery.  Straight leg raises and short arc quads should be started the afternoon or evening of surgery and continued until you come back for your follow-up appointment.  Attached is an instruction sheet on how to perform these two simple exercises.  Do these at least three times per day if not more.  You may bend your knee as much as is comfortable.  The puncture wounds may occasionally be slightly uncomfortable with bending of the knee, and do not let this frighten you.  It is important to keep your knee motion, but do not overdo it.  If you have significant pain, simply do not bend the knee as far.  You will be given more exercises to perform at your first return visit.  RETURN APPOINTMENT Please make an appointment to be seen in the office 7-10 days from your surgery.        ARTHROSCOPIC KNEE SURGERY POST OPERATIVE EXERCISES  SHORT ARC QUADS  1. Lie on back with legs straight Place towel roll under thigh, just above knee 2. Tighten thigh muscles to straighten knee and lift heel off bed 3. Hold for slow count of five, then lower 4. Do three sets of ten         STRAIGHT LEG RAISES  1. Lie on back with operative leg straight and other leg bent 2. Keeping operative leg completely straight, slowly lift the leg so foot is 5 off bed 3. Hold for slow count of five, then lower 4. Do three sets of ten         DO BOTH EXERCISES 3-4 TIMES A DAY.  ANKLE PUMPS  Work/move both ankle and foot up and down 10 times  every hour while awake.

## 2018-01-13 NOTE — Anesthesia Preprocedure Evaluation (Addendum)
Anesthesia Evaluation  Patient identified by MRN, date of birth, ID band Patient awake    Reviewed: Allergy & Precautions, NPO status , Patient's Chart, lab work & pertinent test results  Airway Mallampati: III  TM Distance: >3 FB Neck ROM: Full    Dental no notable dental hx.    Pulmonary sleep apnea and Continuous Positive Airway Pressure Ventilation ,    Pulmonary exam normal breath sounds clear to auscultation       Cardiovascular hypertension, Pt. on medications Normal cardiovascular exam Rhythm:Regular Rate:Normal  ECHO: Normal LV size with EF 60-65%. Normal RV size and systolic function. No significant valvular abnormalities.   Neuro/Psych  Headaches, TBI (traumatic brain injury) negative psych ROS   GI/Hepatic negative GI ROS, (+)     substance abuse  ,   Endo/Other  negative endocrine ROS  Renal/GU negative Renal ROS     Musculoskeletal  (+) narcotic dependentGout   Abdominal (+) + obese,   Peds  Hematology negative hematology ROS (+)   Anesthesia Other Findings Left knee torn medial meniscus/patella tendon  Reproductive/Obstetrics                            Anesthesia Physical Anesthesia Plan  ASA: II  Anesthesia Plan: General   Post-op Pain Management:    Induction: Intravenous  PONV Risk Score and Plan: 2 and Ondansetron, Dexamethasone, Midazolam and Treatment may vary due to age or medical condition  Airway Management Planned: LMA  Additional Equipment:   Intra-op Plan:   Post-operative Plan: Extubation in OR  Informed Consent: I have reviewed the patients History and Physical, chart, labs and discussed the procedure including the risks, benefits and alternatives for the proposed anesthesia with the patient or authorized representative who has indicated his/her understanding and acceptance.   Dental advisory given  Plan Discussed with: CRNA  Anesthesia Plan  Comments:         Anesthesia Quick Evaluation

## 2018-01-13 NOTE — Interval H&P Note (Signed)
History and Physical Interval Note:  01/13/2018 3:26 PM  Wayne Wise  has presented today for surgery, with the diagnosis of Left knee torn medial meniscus/patella tendon  The various methods of treatment have been discussed with the patient and family. After consideration of risks, benefits and other options for treatment, the patient has consented to  Procedure(s): KNEE ARTHROSCOPY WITH DEBRIDEMENT, PARTIAL MEDIAL MENISECTOMY AND PLATELET RICH PLASMA TO PATELLA TENDON (Left) as a surgical intervention .  The patient's history has been reviewed, patient examined, no change in status, stable for surgery.  I have reviewed the patient's chart and labs.  Questions were answered to the patient's satisfaction.     Irys Nigh ANDREW

## 2018-01-13 NOTE — Transfer of Care (Signed)
Immediate Anesthesia Transfer of Care Note  Patient: Wayne Wise  Procedure(s) Performed: Procedure(s) (LRB): KNEE ARTHROSCOPY WITH DEBRIDEMENT, PARTIAL MEDIAL MENISECTOMY AND PLATELET RICH PLASMA TO PATELLA TENDON, MEDIAL MENISCUS REPAIR (Left)  Patient Location: PACU  Anesthesia Type: General  Level of Consciousness: awake, oriented, sedated and patient cooperative  Airway & Oxygen Therapy: Patient Spontanous Breathing and Patient connected to face mask oxygen  Post-op Assessment: Report given to PACU RN and Post -op Vital signs reviewed and stable  Post vital signs: Reviewed and stable  Complications: No apparent anesthesia complications  Last Vitals:  Vitals Value Taken Time  BP 139/74 01/13/2018  5:03 PM  Temp    Pulse 84 01/13/2018  5:06 PM  Resp 17 01/13/2018  5:06 PM  SpO2 100 % 01/13/2018  5:06 PM  Vitals shown include unvalidated device data.  Last Pain:  Vitals:   01/13/18 1301  TempSrc:   PainSc: 5       Patients Stated Pain Goal: 4 (01/13/18 1301)

## 2018-01-13 NOTE — Op Note (Signed)
[id number]

## 2018-01-13 NOTE — Interval H&P Note (Signed)
History and Physical Interval Note:  01/13/2018 3:26 PM  Wayne Wise  has presented today for surgery, with the diagnosis of Left knee torn medial meniscus/patella tendon  The various methods of treatment have been discussed with the patient and family. After consideration of risks, benefits and other options for treatment, the patient has consented to  Procedure(s): KNEE ARTHROSCOPY WITH DEBRIDEMENT, PARTIAL MEDIAL MENISECTOMY AND PLATELET RICH PLASMA TO PATELLA TENDON (Left) as a surgical intervention .  The patient's history has been reviewed, patient examined, no change in status, stable for surgery.  I have reviewed the patient's chart and labs.  Questions were answered to the patient's satisfaction.     Rochel Privett ANDREW

## 2018-01-14 NOTE — Anesthesia Postprocedure Evaluation (Signed)
Anesthesia Post Note  Patient: Wayne Wise  Procedure(s) Performed: KNEE ARTHROSCOPY WITH DEBRIDEMENT, PARTIAL MEDIAL MENISECTOMY AND PLATELET RICH PLASMA TO PATELLA TENDON, MEDIAL MENISCUS REPAIR (Left )     Patient location during evaluation: PACU Anesthesia Type: General Level of consciousness: awake and alert Pain management: pain level controlled Vital Signs Assessment: post-procedure vital signs reviewed and stable Respiratory status: spontaneous breathing, nonlabored ventilation, respiratory function stable and patient connected to nasal cannula oxygen Cardiovascular status: blood pressure returned to baseline and stable Postop Assessment: no apparent nausea or vomiting Anesthetic complications: no    Last Vitals:  Vitals:   01/13/18 1755 01/13/18 1830  BP: (!) 145/80 132/80  Pulse: 83 80  Resp: 16 15  Temp: 36.5 C (!) 36.4 C  SpO2: 95% 96%    Last Pain:  Vitals:   01/13/18 1755  TempSrc:   PainSc: 7    Pain Goal: Patients Stated Pain Goal: 4 (01/13/18 1301)               Adelis Docter

## 2018-01-15 ENCOUNTER — Encounter (HOSPITAL_BASED_OUTPATIENT_CLINIC_OR_DEPARTMENT_OTHER): Payer: Self-pay | Admitting: Specialist

## 2018-02-02 ENCOUNTER — Other Ambulatory Visit: Payer: Self-pay | Admitting: Nurse Practitioner

## 2018-02-02 DIAGNOSIS — I10 Essential (primary) hypertension: Secondary | ICD-10-CM

## 2018-02-02 MED ORDER — TELMISARTAN-HCTZ 80-25 MG PO TABS: 1.0000 | ORAL_TABLET | Freq: Every day | ORAL | 1 refills | Status: DC

## 2018-02-02 NOTE — Telephone Encounter (Signed)
Pt stated he spoke with the pharmacy to get a refill on his losartan-hydrochlorothiazide (HYZAAR) 100-25 MG tablet  and was told it is on back order and not being produced at this time. Would like to know if a different rx can be sent in. Stated PCP was now Jodi Mourning, not [place] Please advise.   [id number] [address], [place] [phone] (Phone) [phone] (Fax)

## 2018-02-02 NOTE — Telephone Encounter (Signed)
Please let him know that I changed him to [name] 80/25 to replace the Losartan HCT; he needs to follow-up for blood pressure check in the next month please.

## 2018-03-10 ENCOUNTER — Telehealth: Payer: Self-pay

## 2018-03-10 ENCOUNTER — Other Ambulatory Visit: Payer: Self-pay | Admitting: Family

## 2018-03-10 DIAGNOSIS — L509 Urticaria, unspecified: Secondary | ICD-10-CM

## 2018-03-10 NOTE — Telephone Encounter (Signed)
Sent patient my-chart message that referral had been completed today per his request.

## 2018-03-10 NOTE — Telephone Encounter (Signed)
Order updated as requested.

## 2018-03-10 NOTE — Telephone Encounter (Signed)
Copied from [place] [id number]. Topic: Referral - Request for Referral >> Mar 10, 2018 10:07 AM Alfredia Ferguson R wrote: Has patient seen PCP for this complaint? Yes Referral for which specialty:Allergy Preferred provider/office: Reason for referral:Itchiness;Rash

## 2018-03-10 NOTE — Telephone Encounter (Signed)
Copied from [place] [id number]. Topic: Referral - Request for Referral >> Mar 10, 2018 10:07 AM Alfredia Ferguson R wrote: Has patient seen PCP for this complaint? Yes Referral for which specialty:Allergy Preferred provider/office: Reason for referral:Itchiness;Rash

## 2018-03-25 ENCOUNTER — Ambulatory Visit: Payer: 59 | Admitting: Allergy and Immunology

## 2018-03-25 ENCOUNTER — Encounter: Payer: Self-pay | Admitting: Allergy and Immunology

## 2018-03-25 VITALS — BP 138/78 | HR 100 | Temp 97.8°F | Resp 18 | Ht 72.5 in | Wt 269.4 lb

## 2018-03-25 DIAGNOSIS — J3089 Other allergic rhinitis: Secondary | ICD-10-CM

## 2018-03-25 DIAGNOSIS — K2970 Gastritis, unspecified, without bleeding: Secondary | ICD-10-CM | POA: Diagnosis not present

## 2018-03-25 DIAGNOSIS — H1010 Acute atopic conjunctivitis, unspecified eye: Secondary | ICD-10-CM | POA: Insufficient documentation

## 2018-03-25 DIAGNOSIS — L500 Allergic urticaria: Secondary | ICD-10-CM | POA: Diagnosis not present

## 2018-03-25 DIAGNOSIS — T7840XD Allergy, unspecified, subsequent encounter: Secondary | ICD-10-CM

## 2018-03-25 DIAGNOSIS — H1013 Acute atopic conjunctivitis, bilateral: Secondary | ICD-10-CM

## 2018-03-25 MED ORDER — LEVOCETIRIZINE DIHYDROCHLORIDE 5 MG PO TABS: 5.0000 mg | ORAL_TABLET | Freq: Every evening | ORAL | 5 refills | Status: DC

## 2018-03-25 MED ORDER — MONTELUKAST SODIUM 10 MG PO TABS: 10.0000 mg | ORAL_TABLET | Freq: Every day | ORAL | 5 refills | Status: DC

## 2018-03-25 MED ORDER — FLUTICASONE PROPIONATE 50 MCG/ACT NA SUSP: 1.0000 | Freq: Two times a day (BID) | NASAL | 5 refills | Status: DC

## 2018-03-25 NOTE — Patient Instructions (Addendum)
Recurrent urticaria Unclear etiology. Skin tests to select food allergens were negative today. NSAIDs and emotional stress commonly exacerbate urticaria but are not the underlying etiology in this case. Physical urticarias are negative by history (i.e. pressure-induced, temperature, vibration, solar, etc.). History and lesions are not consistent with urticaria pigmentosa so I am not suspicious for mastocytosis. There are no concomitant symptoms concerning for anaphylaxis or constitutional symptoms worrisome for an underlying malignancy. We will rule out other potential etiologies with labs. For symptom relief, patient is to take oral antihistamines as directed.  The following labs have been ordered: FCeRI antibody, anti-thyroglobulin antibody, thyroid peroxidase antibody, tryptase, urea breath test, CBC, CMP, ESR, ANA, and alpha gal panel.    The patient will be called with further recommendations after lab results have returned.  Instructions have been discussed and provided for H1/H2 receptor blockade with titration to find lowest effective dose.  A prescription has been provided for levocetirizine, 5 mg daily as needed.  A prescription has been provided for montelukast 10 mg daily at bedtime.  Should there be a significant increase or change in symptoms, a journal is to be kept recording any foods eaten, beverages consumed, medications taken within a 6 hour period prior to the onset of symptoms, as well as record activities being performed, and environmental conditions. For any symptoms concerning for anaphylaxis, 911 is to be called immediately.  Perennial allergic rhinitis  Aeroallergen avoidance measures have been discussed and provided in written form.  Levocetirizine and montelukast have been prescribed (as above).  A prescription has been provided for fluticasone nasal spray, one spray per nostril 1-2 times daily as needed. Proper nasal spray technique has been discussed and  demonstrated.  Nasal saline spray (i.e., Simply Saline) or nasal saline lavage (i.e., NeilMed) is recommended as needed and prior to medicated nasal sprays.   When lab results have returned the patient will be called with further recommendations and follow up instructions.  Urticaria (Hives)  . Levocetirizine (Xyzal) 5 mg twice a day and famotidine (Pepcid) 20 mg twice a day. If no symptoms for 7-14 days then decrease to. . Levocetirizine (Xyzal) 5 mg twice a day and famotidine (Pepcid) 20 mg once a day.  If no symptoms for 7-14 days then decrease to. . Levocetirizine (Xyzal) 5 mg twice a day.  If no symptoms for 7-14 days then decrease to. . Levocetirizine (Xyzal) 5 mg once a day.  May use Benadryl (diphenhydramine) as needed for breakthrough symptoms       If symptoms return, then step up dosage   Control of Cockroach Allergen  Cockroach allergen has been identified as an important cause of acute attacks of asthma, especially in urban settings.  There are fifty-five species of cockroach that exist in the [place], however only three, the American, German and Oriental species produce allergen that can affect patients with Asthma.  Allergens can be obtained from fecal particles, egg casings and secretions from cockroaches.    1. Remove food sources. 2. Reduce access to water. 3. Seal access and entry points. 4. Spray runways with 0.5-1% Diazinon or Chlorpyrifos 5. Blow boric acid power under stoves and refrigerator. 6. Place bait stations (hydramethylnon) at feeding sites.   

## 2018-03-25 NOTE — Assessment & Plan Note (Signed)
   Aeroallergen avoidance measures have been discussed and provided in written form.  Levocetirizine and montelukast have been prescribed (as above).  A prescription has been provided for fluticasone nasal spray, one spray per nostril 1-2 times daily as needed. Proper nasal spray technique has been discussed and demonstrated.  Nasal saline spray (i.e., Simply Saline) or nasal saline lavage (i.e., NeilMed) is recommended as needed and prior to medicated nasal sprays.

## 2018-03-25 NOTE — Assessment & Plan Note (Signed)
Unclear etiology. Skin tests to select food allergens were negative today. NSAIDs and emotional stress commonly exacerbate urticaria but are not the underlying etiology in this case. Physical urticarias are negative by history (i.e. pressure-induced, temperature, vibration, solar, etc.). History and lesions are not consistent with urticaria pigmentosa so I am not suspicious for mastocytosis. There are no concomitant symptoms concerning for anaphylaxis or constitutional symptoms worrisome for an underlying malignancy. We will rule out other potential etiologies with labs. For symptom relief, patient is to take oral antihistamines as directed.  The following labs have been ordered: FCeRI antibody, anti-thyroglobulin antibody, thyroid peroxidase antibody, tryptase, urea breath test, CBC, CMP, ESR, ANA, and alpha gal panel.    The patient will be called with further recommendations after lab results have returned.  Instructions have been discussed and provided for H1/H2 receptor blockade with titration to find lowest effective dose.  A prescription has been provided for levocetirizine, 5 mg daily as needed.  A prescription has been provided for montelukast 10 mg daily at bedtime.  Should there be a significant increase or change in symptoms, a journal is to be kept recording any foods eaten, beverages consumed, medications taken within a 6 hour period prior to the onset of symptoms, as well as record activities being performed, and environmental conditions. For any symptoms concerning for anaphylaxis, 911 is to be called immediately.

## 2018-03-25 NOTE — Progress Notes (Signed)
New Patient Note  RE: Wayne Wise MRN: [id number] DOB: 1980/12/17 Date of Office Visit: 03/25/2018  Referring provider: Marrian Salvage,* Primary care provider: Marrian Salvage, [place]  Chief Complaint: Urticaria   History of present illness: Wayne Wise is a 38 y.o. male seen today in consultation requested by Marrian Salvage, [place].  Over the past 1 year, Wayne Wise has experienced recurrent episodes of hives. The hives have appeared at different times over his entire body.  The lesions are described as erythematous, raised, and pruritic.  Individual hives last less than 24 hours without leaving residual pigmentation or bruising. He denies concomitant angioedema, cardiopulmonary symptoms and GI symptoms. He has not experienced unexpected weight loss, recurrent fevers or drenching night sweats. No specific medication, food, skin care product, detergent, soap, or other environmental triggers have been identified. The symptoms do not seem to correlate with NSAIDs use or emotional stress. He did not have symptoms consistent with a respiratory tract infection at the time of symptom onset. Wayne Wise has tried to control symptoms with ice packs and cold compresses which have offered minimal temporary relief. He has not been evaluated and treated in the emergency department for these symptoms. Skin biopsy has not been performed. Recently, he has been experiencing hives daily. He experiences nasal congestion, rhinorrhea, sneezing, postnasal drainage, nasal pruritus, ocular pruritus, and occasional sinus pressure.  These symptoms are most frequent and severe with pollen exposure.  He endorses symptoms without taking allergy medications.  Assessment and plan: Recurrent urticaria Unclear etiology. Skin tests to select food allergens were negative today. NSAIDs and emotional stress commonly exacerbate urticaria but are not the underlying etiology in this case. Physical urticarias are negative by history  (i.e. pressure-induced, temperature, vibration, solar, etc.). History and lesions are not consistent with urticaria pigmentosa so I am not suspicious for mastocytosis. There are no concomitant symptoms concerning for anaphylaxis or constitutional symptoms worrisome for an underlying malignancy. We will rule out other potential etiologies with labs. For symptom relief, patient is to take oral antihistamines as directed.  The following labs have been ordered: FCeRI antibody, anti-thyroglobulin antibody, thyroid peroxidase antibody, tryptase, urea breath test, CBC, CMP, ESR, ANA, and alpha gal panel.    The patient will be called with further recommendations after lab results have returned.  Instructions have been discussed and provided for H1/H2 receptor blockade with titration to find lowest effective dose.  A prescription has been provided for levocetirizine, 5 mg daily as needed.  A prescription has been provided for montelukast 10 mg daily at bedtime.  Should there be a significant increase or change in symptoms, a journal is to be kept recording any foods eaten, beverages consumed, medications taken within a 6 hour period prior to the onset of symptoms, as well as record activities being performed, and environmental conditions. For any symptoms concerning for anaphylaxis, 911 is to be called immediately.  Perennial allergic rhinitis  Aeroallergen avoidance measures have been discussed and provided in written form.  Levocetirizine and montelukast have been prescribed (as above).  A prescription has been provided for fluticasone nasal spray, one spray per nostril 1-2 times daily as needed. Proper nasal spray technique has been discussed and demonstrated.  Nasal saline spray (i.e., Simply Saline) or nasal saline lavage (i.e., NeilMed) is recommended as needed and prior to medicated nasal sprays.   Meds ordered this encounter  Medications  . levocetirizine (XYZAL) 5 MG tablet    Sig: Take  1 tablet (5 mg total) by  mouth every evening.    Dispense:  30 tablet    Refill:  5  . montelukast (SINGULAIR) 10 MG tablet    Sig: Take 1 tablet (10 mg total) by mouth at bedtime.    Dispense:  30 tablet    Refill:  5  . fluticasone (FLONASE) 50 MCG/ACT nasal spray    Sig: Place 1 spray into both nostrils 2 (two) times daily.    Dispense:  18.2 g    Refill:  5    Diagnostics: Environmental skin testing: Positive to mold and cockroach antigen. Food allergen skin testing: Negative despite a positive histamine control.    Physical examination: Blood pressure 138/78, pulse 100, temperature 97.8 F (36.6 C), temperature source Oral, resp. rate 18, height 6' 0.5" (1.842 m), weight 269 lb 6.4 oz (122.2 kg), SpO2 97 %.  General: Alert, interactive, in no acute distress. HEENT: TMs pearly gray, turbinates moderately edematous without discharge, post-pharynx moderately erythematous. Neck: Supple without lymphadenopathy. Lungs: Clear to auscultation without wheezing, rhonchi or rales. CV: Normal S1, S2 without murmurs. Abdomen: Nondistended, nontender. Skin: Warm and dry, without lesions or rashes. Extremities:  No clubbing, cyanosis or edema. Neuro:   Grossly intact.  Review of systems:  Review of systems negative except as noted in HPI / PMHx or noted below: Review of Systems  Constitutional: Negative.   HENT: Negative.   Eyes: Negative.   Respiratory: Negative.   Cardiovascular: Negative.   Gastrointestinal: Negative.   Genitourinary: Negative.   Musculoskeletal: Negative.   Skin: Negative.   Neurological: Negative.   Endo/Heme/Allergies: Negative.   Psychiatric/Behavioral: Negative.     Past medical history:  Past Medical History:  Diagnosis Date  . Arthritis   . Chicken pox as child  . Headache syndrome 07/01/2017  . 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 History of kidney stones   . Hypertension   . Migraines   . Sleep apnea    cpap autoset starts at 3  . TBI  (traumatic brain injury) ([place]) 2011    Past surgical history:  Past Surgical History:  Procedure Laterality Date  . FRACTURE SURGERY Left 2012   Left clavicle  . KNEE ARTHROSCOPY WITH MEDIAL MENISECTOMY Left 01/13/2018   Procedure: KNEE ARTHROSCOPY WITH DEBRIDEMENT, PARTIAL MEDIAL MENISECTOMY AND PLATELET RICH PLASMA TO PATELLA TENDON, MEDIAL MENISCUS REPAIR;  Surgeon: Sydnee Cabal, MD;  Location: [facility];  Service: Orthopedics;  Laterality: Left;  . rod removed from left clavicle  2012    Family history: Family History  Problem Relation Age of Onset  . Arthritis Mother   . Arthritis Maternal Grandmother   . Breast cancer Maternal Grandmother   . Diabetes Maternal Grandmother   . Stroke Maternal Grandfather   . Diabetes Maternal Grandfather   . Breast cancer Paternal Grandmother   . Allergic rhinitis Neg Hx   . Angioedema Neg Hx   . Asthma Neg Hx   . Eczema Neg Hx   . Immunodeficiency Neg Hx   . Urticaria Neg Hx     Social history: Social History   Socioeconomic History  . Marital status: Married    Spouse name: Not on file  . Number of children: 1  . Years of education: 47  . Highest education level: Not on file  Occupational History  . Occupation: Quarry manager  Social Needs  . Financial resource strain: Not on file  . Food insecurity:    Worry: Not on file    Inability: Not  on file  . Transportation needs:    Medical: Not on file    Non-medical: Not on file  Tobacco Use  . Smoking status: Never Smoker  . Smokeless tobacco: Never Used  Substance and Sexual Activity  . Alcohol use: Yes    Comment: occasionally  . Drug use: No  . Sexual activity: Not on file  Lifestyle  . Physical activity:    Days per week: Not on file    Minutes per session: Not on file  . Stress: Not on file  Relationships  . Social connections:    Talks on phone: Not on file    Gets together: Not on file    Attends religious service: Not on file    Active  member of club or organization: Not on file    Attends meetings of clubs or organizations: Not on file    Relationship status: Not on file  . Intimate partner violence:    Fear of current or ex partner: Not on file    Emotionally abused: Not on file    Physically abused: Not on file    Forced sexual activity: Not on file  Other Topics Concern  . Not on file  Social History Narrative   Born and raised in [place], [place]. Currently resides in a private residence wife and son.    1 cat (farm outside).    Fun: Likes to hunt.    Denies religious beliefs that effect healthcare.    Caffeine use:  Daily (coffee)   Environmental History: The patient lives in a house built in [place] with hardwood floors throughout and central air/heat.  There is no known mold/water damage in the home.  There is a cat in the home which has access to his bedroom.  He is a non-smoker.  Allergies as of 03/25/2018      Reactions   Hydrocodone    Dizzy    Poison Ivy Extract    Sulfa Antibiotics Swelling   Swelling of fingers      Medication List       Accurate as of March 25, 2018  1:02 PM. Always use your most recent med list.        aspirin 325 MG tablet Take 325 mg by mouth daily.   fluticasone 50 MCG/ACT nasal spray Commonly known as:  FLONASE Place 1 spray into both nostrils 2 (two) times daily.   levocetirizine 5 MG tablet Commonly known as:  XYZAL Take 1 tablet (5 mg total) by mouth every evening.   montelukast 10 MG tablet Commonly known as:  SINGULAIR Take 1 tablet (10 mg total) by mouth at bedtime.   telmisartan-hydrochlorothiazide 80-25 MG tablet Commonly known as:  [name] Take 1 tablet by mouth daily.   vitamin E 1000 UNIT capsule Take 1,000 Units by mouth daily.       Known medication allergies: Allergies  Allergen Reactions  . Hydrocodone     Dizzy   . Poison Ivy Extract   . Sulfa Antibiotics Swelling    Swelling of fingers    I appreciate the opportunity to  take part in Wayne Wise's care. Please do not hesitate to contact me with questions.  Sincerely,   R. Edgar Frisk, MD

## 2018-03-26 DIAGNOSIS — T7840XD Allergy, unspecified, subsequent encounter: Secondary | ICD-10-CM | POA: Diagnosis not present

## 2018-03-26 DIAGNOSIS — L500 Allergic urticaria: Secondary | ICD-10-CM | POA: Diagnosis not present

## 2018-03-26 DIAGNOSIS — K2970 Gastritis, unspecified, without bleeding: Secondary | ICD-10-CM | POA: Diagnosis not present

## 2018-03-27 LAB — H. PYLORI BREATH TEST: H pylori Breath Test: NEGATIVE

## 2018-04-01 LAB — COMPREHENSIVE METABOLIC PANEL
ALT: 47 IU/L — ABNORMAL HIGH (ref 0–44)
AST: 21 IU/L (ref 0–40)
Albumin/Globulin Ratio: 1.9 (ref 1.2–2.2)
Albumin: 4.7 g/dL (ref 4.0–5.0)
Alkaline Phosphatase: 116 IU/L (ref 39–117)
BUN/Creatinine Ratio: 21 — ABNORMAL HIGH (ref 9–20)
BUN: 19 mg/dL (ref 6–20)
Bilirubin Total: <0.2 mg/dL (ref 0.0–1.2)
CO2: 23 mmol/L (ref 20–29)
Calcium: 9.4 mg/dL (ref 8.7–10.2)
Chloride: 98 mmol/L (ref 96–106)
Creatinine, Ser: 0.9 mg/dL (ref 0.76–1.27)
GFR calc Af Amer: 124 mL/min/{1.73_m2} (ref >59)
GFR calc non Af Amer: 107 mL/min/{1.73_m2} (ref >59)
Globulin, Total: 2.5 g/dL (ref 1.5–4.5)
Glucose: 78 mg/dL (ref 65–99)
Potassium: 4.1 mmol/L (ref 3.5–5.2)
Sodium: 140 mmol/L (ref 134–144)
Total Protein: 7.2 g/dL (ref 6.0–8.5)

## 2018-04-01 LAB — THYROID PEROXIDASE ANTIBODY: Thyroperoxidase Ab SerPl-aCnc: 8 IU/mL (ref 0–34)

## 2018-04-01 LAB — ALPHA-GAL PANEL
Alpha Gal IgE*: 1.04 kU/L — ABNORMAL HIGH (ref <0.10)
Beef (Bos spp) IgE: 0.78 kU/L — ABNORMAL HIGH (ref <0.35)
Class Interpretation: 1
Class Interpretation: 2
Lamb/Mutton (Ovis spp) IgE: 0.18 kU/L (ref <0.35)
Pork (Sus spp) IgE: 0.5 kU/L — ABNORMAL HIGH (ref <0.35)

## 2018-04-01 LAB — SEDIMENTATION RATE: Sed Rate: 42 mm/hr — ABNORMAL HIGH (ref 0–15)

## 2018-04-01 LAB — CBC
Hematocrit: 44.8 % (ref 37.5–51.0)
Hemoglobin: 15.2 g/dL (ref 13.0–17.7)
MCH: 31.5 pg (ref 26.6–33.0)
MCHC: 33.9 g/dL (ref 31.5–35.7)
MCV: 93 fL (ref 79–97)
Platelets: 254 10*3/uL (ref 150–450)
RBC: 4.82 x10E6/uL (ref 4.14–5.80)
RDW: 12.6 % (ref 11.6–15.4)
WBC: 8.1 10*3/uL (ref 3.4–10.8)

## 2018-04-01 LAB — CHRONIC URTICARIA: cu index: 10.7 — ABNORMAL HIGH (ref <10)

## 2018-04-01 LAB — TRYPTASE: Tryptase: 3.4 ug/L (ref 2.2–13.2)

## 2018-04-01 LAB — ANA: Anti Nuclear Antibody(ANA): NEGATIVE

## 2018-04-01 LAB — THYROGLOBULIN ANTIBODY: Thyroglobulin Antibody: <1 IU/mL (ref 0.0–0.9)

## 2018-04-02 ENCOUNTER — Other Ambulatory Visit: Payer: Self-pay

## 2018-04-02 MED ORDER — EPINEPHRINE 0.3 MG/0.3ML IJ SOAJ: 0.3000 mg | Freq: Once | INTRAMUSCULAR | 1 refills | Status: AC

## 2018-04-07 ENCOUNTER — Other Ambulatory Visit: Payer: Self-pay | Admitting: Family

## 2018-06-28 ENCOUNTER — Other Ambulatory Visit: Payer: Self-pay | Admitting: Family

## 2018-07-03 ENCOUNTER — Other Ambulatory Visit: Payer: Self-pay | Admitting: Family

## 2018-07-03 MED ORDER — TELMISARTAN-HCTZ 80-25 MG PO TABS: 1.0000 | ORAL_TABLET | Freq: Every day | ORAL | 1 refills | Status: DC

## 2018-07-10 ENCOUNTER — Telehealth: Payer: Self-pay | Admitting: Family

## 2018-07-10 NOTE — Telephone Encounter (Signed)
[facility] let us know that [name] is out of stock at the moment. They don't know if they will have the drug for the next 6 months.  Does he want to change Rx? Does he want to just get the prescription locally and stay on [name]?

## 2018-07-10 NOTE — Telephone Encounter (Signed)
Spoke with patient. He is okay with med change to something different that can be mailed to him.  Please advise  Thanks

## 2018-07-13 ENCOUNTER — Other Ambulatory Visit: Payer: Self-pay | Admitting: Family

## 2018-07-13 MED ORDER — OLMESARTAN MEDOXOMIL-HCTZ 40-25 MG PO TABS: 1.0000 | ORAL_TABLET | Freq: Every day | ORAL | 1 refills | Status: DC

## 2018-07-13 NOTE — Telephone Encounter (Signed)
Also created CRM incase he calls back.

## 2018-07-13 NOTE — Telephone Encounter (Signed)
I sent in alternative called Olmesartan HCT 40/25; that is equivalent to the [name] he has been taking. He will need a blood pressure follow-up once he has been on the next medication for 2-3 weeks.

## 2018-07-13 NOTE — Telephone Encounter (Signed)
Called and left message for patient.

## 2018-07-13 NOTE — Addendum Note (Signed)
Addended by: Sherlene Shams on: 07/13/2018 11:28 AM   Modules accepted: Orders

## 2018-09-07 ENCOUNTER — Telehealth: Payer: Self-pay | Admitting: Family

## 2018-09-07 NOTE — Telephone Encounter (Signed)
Medication Refill - Medication:  Diclofenac 75mg   Has the patient contacted their pharmacy? Yes- RX expired (Agent: If no, request that the patient contact the pharmacy for the refill.) (Agent: If yes, when and what did the pharmacy advise?)  Preferred Pharmacy (with phone number or street name):  [id number] [address], [place] [phone] (Phone) [phone] (Fax)     Agent: Please be advised that RX refills may take up to 3 business days. We ask that you follow-up with your pharmacy.

## 2018-09-07 NOTE — Telephone Encounter (Signed)
He needs to contact his orthopedist about the [facility] [place] refill; has not been written by anyone here since 2017.

## 2018-09-07 NOTE — Telephone Encounter (Signed)
Called and left message with patient

## 2018-09-09 ENCOUNTER — Ambulatory Visit (INDEPENDENT_AMBULATORY_CARE_PROVIDER_SITE_OTHER): Payer: 59 | Admitting: Family

## 2018-09-09 ENCOUNTER — Encounter: Payer: Self-pay | Admitting: Family

## 2018-09-09 ENCOUNTER — Other Ambulatory Visit: Payer: Self-pay

## 2018-09-09 VITALS — BP 132/80 | HR 96 | Temp 98.0°F | Ht 72.5 in | Wt 274.4 lb

## 2018-09-09 DIAGNOSIS — I10 Essential (primary) hypertension: Secondary | ICD-10-CM

## 2018-09-09 DIAGNOSIS — M542 Cervicalgia: Secondary | ICD-10-CM | POA: Diagnosis not present

## 2018-09-09 DIAGNOSIS — M109 Gout, unspecified: Secondary | ICD-10-CM

## 2018-09-09 MED ORDER — DICLOFENAC SODIUM 75 MG PO TBEC: 75.0000 mg | DELAYED_RELEASE_TABLET | Freq: Two times a day (BID) | ORAL | 1 refills | Status: DC

## 2018-09-09 MED ORDER — METHYLPREDNISOLONE 4 MG PO TBPK: ORAL_TABLET | ORAL | 0 refills | Status: DC
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
## 2018-09-14 ENCOUNTER — Telehealth: Payer: Self-pay | Admitting: Family

## 2018-09-14 NOTE — Telephone Encounter (Signed)
Relation to pt: self  Call back number: [phone]  Pharmacy: \ [facility] [phone] [address], [place] [phone] (Phone) [phone] (Fax)     Reason for call:  Patient requesting methylPREDNISolone (MEDROL DOSEPAK) 4 MG TBPK tablet stating prednisoLone really helped, patient contacted [facility] to schedule appointment.

## 2018-09-14 NOTE — Telephone Encounter (Signed)
In general, we don't refill the Medrol Pak if he is feeling better; let's get the MRI and will most likely need to refer him to see a specialist about his neck.

## 2018-10-08 ENCOUNTER — Ambulatory Visit
Admission: RE | Admit: 2018-10-08 | Discharge: 2018-10-08 | Disposition: A | Discharge status: Home / Self Care | Payer: 59 | Source: Ambulatory Visit | Attending: Family | Admitting: Family

## 2018-10-08 ENCOUNTER — Other Ambulatory Visit: Payer: Self-pay

## 2018-10-08 DIAGNOSIS — M542 Cervicalgia: Secondary | ICD-10-CM

## 2018-10-09 ENCOUNTER — Other Ambulatory Visit: Payer: Self-pay | Admitting: Family

## 2018-10-09 DIAGNOSIS — R937 Abnormal findings on diagnostic imaging of other parts of musculoskeletal system: Secondary | ICD-10-CM

## 2018-10-20 ENCOUNTER — Telehealth: Payer: Self-pay | Admitting: Emergency Medicine

## 2018-10-20 NOTE — Telephone Encounter (Signed)
Pt called to discuss MRI results. States that he does not use mychart and did not see the results on MyChart. Discussed results and advised him to contact [facility] for appt. MyChart deactivated.

## 2018-12-14 DIAGNOSIS — Z981 Arthrodesis status: Secondary | ICD-10-CM | POA: Insufficient documentation

## 2018-12-24 ENCOUNTER — Other Ambulatory Visit: Payer: Self-pay | Admitting: Family

## 2019-02-19 DIAGNOSIS — G5600 Carpal tunnel syndrome, unspecified upper limb: Secondary | ICD-10-CM | POA: Insufficient documentation

## 2019-03-26 ENCOUNTER — Encounter: Payer: Self-pay | Admitting: Physical Therapy

## 2019-03-26 ENCOUNTER — Ambulatory Visit: Payer: 59 | Attending: Neurosurgery | Admitting: Physical Therapy

## 2019-03-26 ENCOUNTER — Other Ambulatory Visit: Payer: Self-pay

## 2019-03-26 DIAGNOSIS — M542 Cervicalgia: Secondary | ICD-10-CM | POA: Diagnosis present

## 2019-03-26 DIAGNOSIS — R252 Cramp and spasm: Secondary | ICD-10-CM | POA: Diagnosis present
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
## 2019-03-26 NOTE — Patient Instructions (Signed)

## 2019-03-30 ENCOUNTER — Other Ambulatory Visit: Payer: Self-pay

## 2019-03-30 ENCOUNTER — Ambulatory Visit: Payer: 59 | Admitting: Physical Therapy

## 2019-03-30 DIAGNOSIS — R252 Cramp and spasm: Secondary | ICD-10-CM

## 2019-03-30 DIAGNOSIS — M542 Cervicalgia: Secondary | ICD-10-CM | POA: Diagnosis not present
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
## 2019-03-31 ENCOUNTER — Ambulatory Visit: Payer: 59

## 2019-03-31 ENCOUNTER — Other Ambulatory Visit: Payer: Self-pay

## 2019-03-31 DIAGNOSIS — M542 Cervicalgia: Secondary | ICD-10-CM

## 2019-03-31 DIAGNOSIS — R252 Cramp and spasm: Secondary | ICD-10-CM
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
## 2019-04-05 ENCOUNTER — Encounter: Payer: Self-pay | Admitting: Physical Therapy

## 2019-04-05 ENCOUNTER — Ambulatory Visit: Payer: 59 | Admitting: Physical Therapy

## 2019-04-05 ENCOUNTER — Other Ambulatory Visit: Payer: Self-pay

## 2019-04-05 DIAGNOSIS — M542 Cervicalgia: Secondary | ICD-10-CM | POA: Diagnosis not present

## 2019-04-05 DIAGNOSIS — R252 Cramp and spasm: Secondary | ICD-10-CM
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
## 2019-04-09 ENCOUNTER — Ambulatory Visit: Payer: 59 | Admitting: Physical Therapy

## 2019-04-09 ENCOUNTER — Other Ambulatory Visit: Payer: Self-pay

## 2019-04-09 DIAGNOSIS — M542 Cervicalgia: Secondary | ICD-10-CM

## 2019-04-09 DIAGNOSIS — R252 Cramp and spasm: Secondary | ICD-10-CM
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
## 2019-04-12 ENCOUNTER — Encounter: Payer: Self-pay | Admitting: Physical Therapy

## 2019-04-12 ENCOUNTER — Other Ambulatory Visit: Payer: Self-pay

## 2019-04-12 ENCOUNTER — Ambulatory Visit: Payer: 59 | Attending: Neurosurgery | Admitting: Physical Therapy

## 2019-04-12 DIAGNOSIS — M542 Cervicalgia: Secondary | ICD-10-CM | POA: Diagnosis present

## 2019-04-12 DIAGNOSIS — R252 Cramp and spasm: Secondary | ICD-10-CM | POA: Diagnosis present
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
## 2019-04-13 ENCOUNTER — Ambulatory Visit: Payer: 59 | Admitting: Physical Therapy

## 2019-04-13 DIAGNOSIS — R252 Cramp and spasm: Secondary | ICD-10-CM | POA: Diagnosis not present

## 2019-04-13 DIAGNOSIS — M542 Cervicalgia: Secondary | ICD-10-CM
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
## 2019-04-14 ENCOUNTER — Encounter: Payer: 59 | Admitting: Physical Therapy

## 2019-04-16 ENCOUNTER — Other Ambulatory Visit: Payer: Self-pay | Admitting: Allergy and Immunology

## 2019-04-19 ENCOUNTER — Other Ambulatory Visit: Payer: Self-pay

## 2019-04-19 ENCOUNTER — Ambulatory Visit: Payer: 59

## 2019-04-19 DIAGNOSIS — M542 Cervicalgia: Secondary | ICD-10-CM

## 2019-04-19 DIAGNOSIS — R252 Cramp and spasm: Secondary | ICD-10-CM
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
## 2019-04-22 ENCOUNTER — Ambulatory Visit: Payer: 59 | Admitting: Physical Therapy

## 2019-04-26 ENCOUNTER — Other Ambulatory Visit: Payer: Self-pay

## 2019-04-26 ENCOUNTER — Encounter: Payer: Self-pay | Admitting: Physical Therapy

## 2019-04-26 ENCOUNTER — Ambulatory Visit: Payer: 59 | Admitting: Physical Therapy

## 2019-04-26 DIAGNOSIS — R252 Cramp and spasm: Secondary | ICD-10-CM

## 2019-04-26 DIAGNOSIS — M542 Cervicalgia: Secondary | ICD-10-CM
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
## 2019-04-29 ENCOUNTER — Other Ambulatory Visit: Payer: Self-pay

## 2019-04-29 ENCOUNTER — Ambulatory Visit: Payer: 59 | Admitting: Physical Therapy

## 2019-04-29 DIAGNOSIS — M542 Cervicalgia: Secondary | ICD-10-CM

## 2019-04-29 DIAGNOSIS — R252 Cramp and spasm: Secondary | ICD-10-CM
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
## 2019-04-29 NOTE — Patient Instructions (Signed)
Access Code: [email or web address] URL: [email or web address] Date: 04/29/2019 Prepared by: Jeral Pinch  Exercises Thoracic Mobilization on Foam Roll - Hands Clasped - 1 x daily - 3 sets - 10 reps - 45-60 hold Seated Assisted Cervical Rotation with Towel - 1 x daily - 1 sets - 3 reps - 10-20 hold Prone Cervical Retraction - 1 x daily - 2-3 sets - 10 reps

## 2019-05-04 ENCOUNTER — Ambulatory Visit: Payer: 59 | Admitting: Physical Therapy

## 2019-05-04 ENCOUNTER — Other Ambulatory Visit: Payer: Self-pay

## 2019-05-04 DIAGNOSIS — M542 Cervicalgia: Secondary | ICD-10-CM

## 2019-05-04 DIAGNOSIS — R252 Cramp and spasm: Secondary | ICD-10-CM | POA: Diagnosis not present
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
## 2019-05-06 ENCOUNTER — Other Ambulatory Visit: Payer: Self-pay

## 2019-05-06 ENCOUNTER — Ambulatory Visit: Payer: 59

## 2019-05-06 DIAGNOSIS — M542 Cervicalgia: Secondary | ICD-10-CM

## 2019-05-06 DIAGNOSIS — R252 Cramp and spasm: Secondary | ICD-10-CM | POA: Diagnosis not present
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
## 2019-05-10 ENCOUNTER — Ambulatory Visit: Payer: 59

## 2019-05-19 ENCOUNTER — Other Ambulatory Visit: Payer: Self-pay | Admitting: Allergy and Immunology

## 2019-05-25 ENCOUNTER — Ambulatory Visit: Payer: 59 | Attending: Neurosurgery | Admitting: Physical Therapy

## 2019-05-25 ENCOUNTER — Other Ambulatory Visit: Payer: Self-pay

## 2019-05-25 DIAGNOSIS — R252 Cramp and spasm: Secondary | ICD-10-CM | POA: Diagnosis not present

## 2019-05-25 DIAGNOSIS — M542 Cervicalgia: Secondary | ICD-10-CM | POA: Diagnosis present
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
## 2019-05-28 ENCOUNTER — Other Ambulatory Visit: Payer: Self-pay | Admitting: Allergy and Immunology

## 2019-06-28 ENCOUNTER — Telehealth: Payer: Self-pay | Admitting: Family

## 2019-06-28 NOTE — Telephone Encounter (Signed)
Typically with this issue, the insurance has specific drugs in the class that they want to pay for. He needs to call his insurance company to ask with ARB medications they prefer and we can change it accordingly.

## 2019-06-28 NOTE — Telephone Encounter (Signed)
New message:   Pt c/o medication issue:  1. Name of Medication: olmesartan-hydrochlorothiazide (BENICAR HCT) 40-25 MG tablet  2. What is your medication issue?  Pt is calling and states this medication cost him $80 the last time he got it and states he can't afford to pay this much for this medication. He would like to know if there is something else that would be cheaper. Please advise.

## 2019-06-29 NOTE — Telephone Encounter (Signed)
Called and left message for patient today 

## 2019-07-15 ENCOUNTER — Other Ambulatory Visit: Payer: Self-pay | Admitting: Allergy and Immunology

## 2019-07-15 ENCOUNTER — Other Ambulatory Visit: Payer: Self-pay | Admitting: Neurosurgery

## 2019-07-15 DIAGNOSIS — M542 Cervicalgia: Secondary | ICD-10-CM

## 2019-07-16 NOTE — Telephone Encounter (Signed)
   Patient calling to request prescription for either Valsartan or Captopril per his insurance

## 2019-07-19 MED ORDER — VALSARTAN-HYDROCHLOROTHIAZIDE 160-25 MG PO TABS: 1.0000 | ORAL_TABLET | Freq: Every day | ORAL | 0 refills | Status: DC

## 2019-07-19 NOTE — Telephone Encounter (Signed)
Notified pt w/Laura response../lmb °

## 2019-07-19 NOTE — Addendum Note (Signed)
Addended by: Sherlene Shams on: 07/19/2019 09:41 AM   Modules accepted: Orders

## 2019-07-19 NOTE — Telephone Encounter (Signed)
Will change the Olmesartan HCT to Valsartan HCT; he needs a blood pressure follow up in the next month please to evaluate response.

## 2019-07-29 ENCOUNTER — Ambulatory Visit
Admission: RE | Admit: 2019-07-29 | Discharge: 2019-07-29 | Disposition: A | Discharge status: Home / Self Care | Payer: 59 | Source: Ambulatory Visit | Attending: Neurosurgery | Admitting: Neurosurgery

## 2019-07-29 ENCOUNTER — Other Ambulatory Visit: Payer: Self-pay

## 2019-07-29 DIAGNOSIS — M542 Cervicalgia: Secondary | ICD-10-CM

## 2019-07-29 IMAGING — CT CT CERVICAL SPINE W/O CM
2 series · 10 of 14 positions shown, 12 images · non-contrast
Comparison: Plain film cervical spine [DATE] from [HOSPITAL]
[REDACTED]. MRI cervical spine [DATE]

CLINICAL DATA: History of prior cervical surgery. Chronic neck
pain.

EXAM:
CT CERVICAL SPINE WITHOUT CONTRAST
TECHNIQUE: Multidetector CT imaging of the cervical spine was performed without
intravenous contrast. Multiplanar CT image reconstructions were also
generated.

[Series 2: cspine soft · axial · 0.25mm/px · z∈[+836,+982]mm · 5 of 111 slices shown]
[im 19/111  soft-tissue]
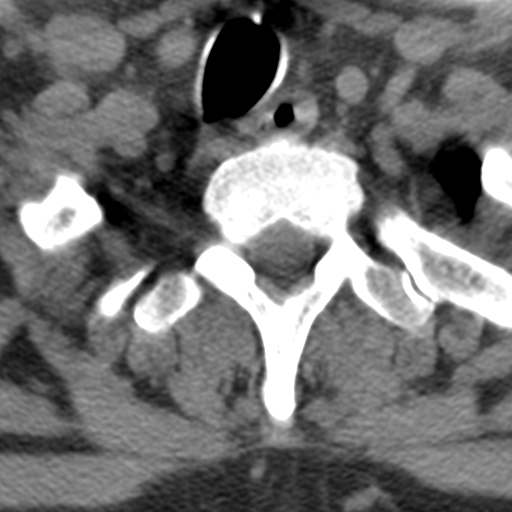
[im 37/111  soft-tissue]
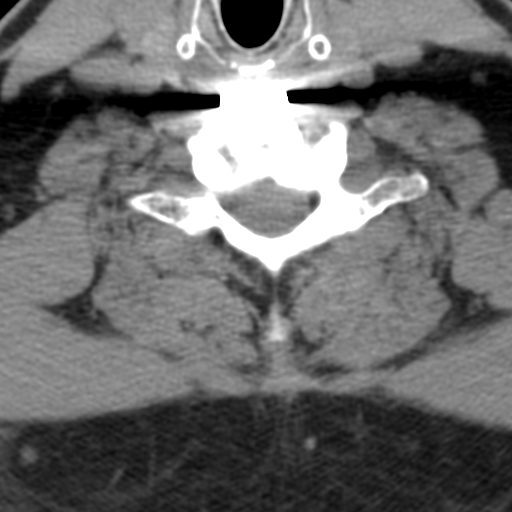
[im 56/111  soft-tissue]
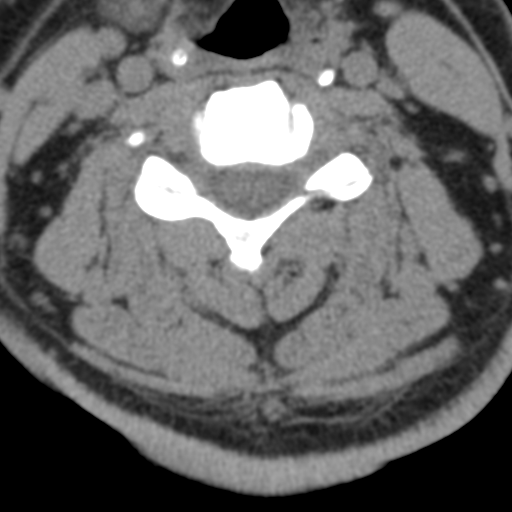
[im 74/111  soft-tissue]
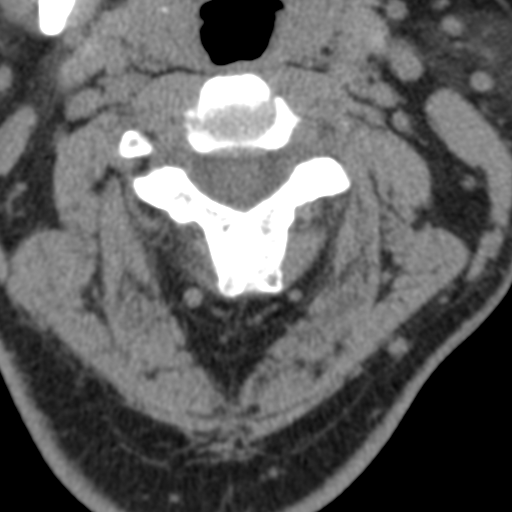
[im 92/111  soft-tissue]
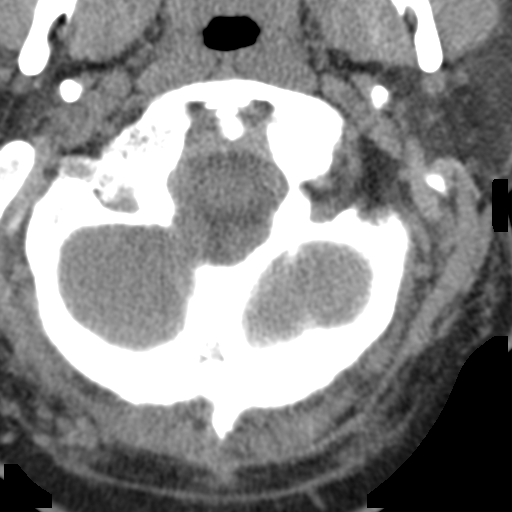

[Series 8: angled axial · axial · 0.29mm/px · z∈[+823,+964]mm · 5 of 110 slices shown, 7 images]
[im 19/110  soft-tissue]
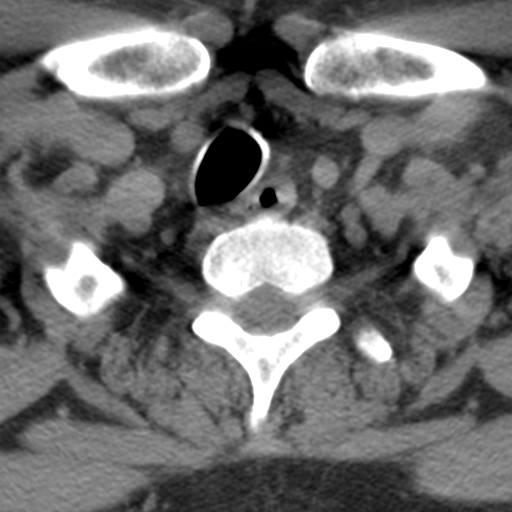
[im 19/110  bone]
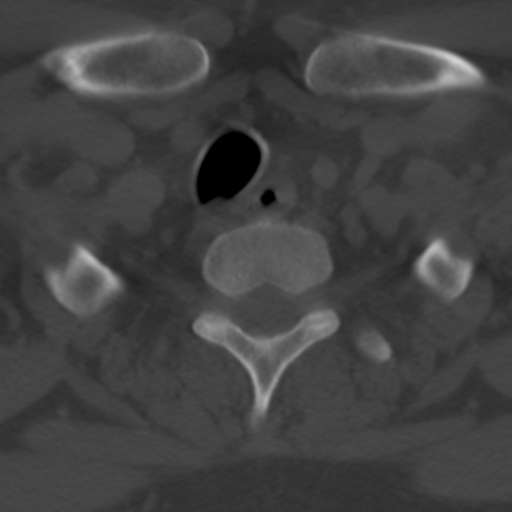
[im 37/110  bone]
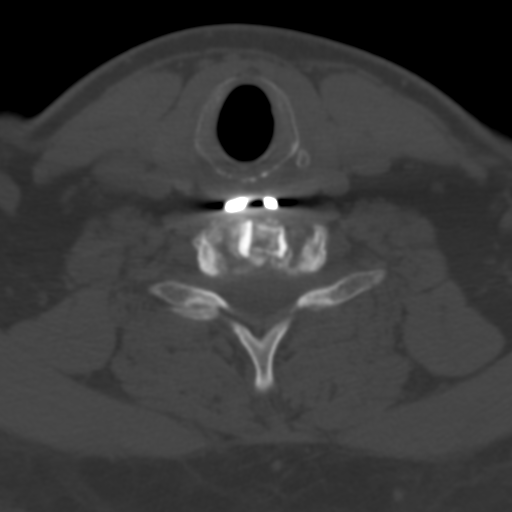
[im 55/110  bone]
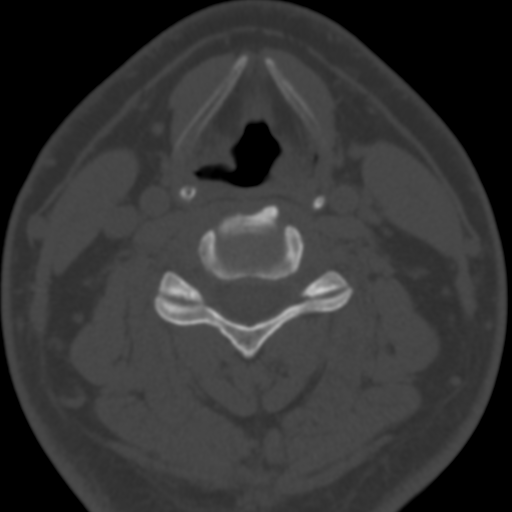
[im 73/110  bone]
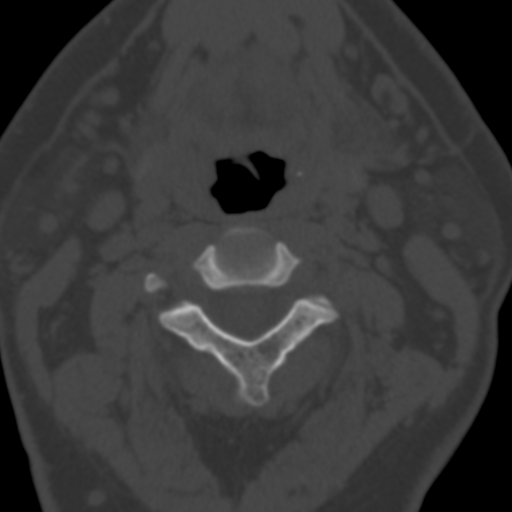
[im 91/110  soft-tissue]
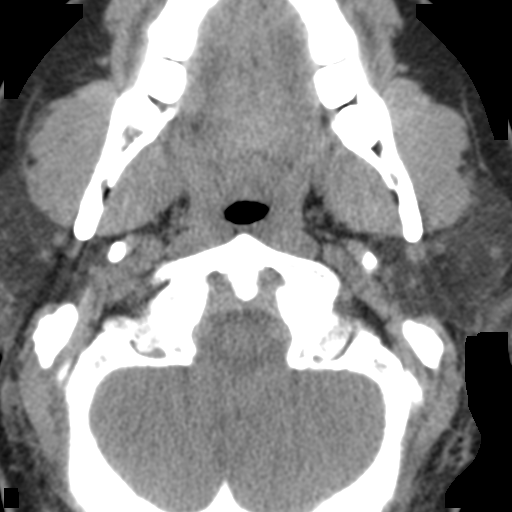
[im 91/110  bone]
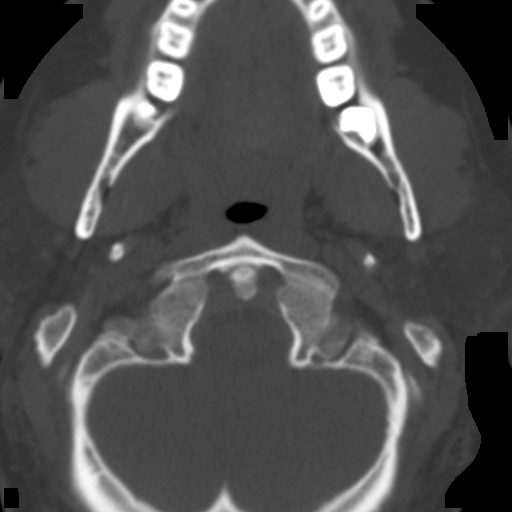

[10 of 14 positions shown; findings below may reference images not displayed]

FINDINGS: Alignment: Normal.

Skull base and vertebrae: No acute fracture. No primary bone lesion
or focal pathologic process. The patient is status post C5-7 ACDF
since the prior MRI. Hardware is intact, well positioned and there
is no evidence of loosening. No bridging bone is seen across the
C4-5 disc interspace. A small amount of bridging bone is seen across
the C5-6 disc interspace. There is no vacuum disc phenomenon at
either level. The facet joints appear normal.

Soft tissues and spinal canal: Negative.

Disc levels:  C2-3: Negative.

C3-4: Negative.

C4-5: Negative.

C5-6: Status post discectomy and fusion. The central canal and
foramina appear open. There is some uncovertebral spurring present.

C6-7: Status post discectomy and fusion. The central canal and
foramina appear open with some uncovertebral spurring noted.

C7-T1: Negative.

Upper chest: Lung apices clear.

Other: None.
IMPRESSION: Status post C5-7 fusion. Hardware is well positioned without
loosening or other complicating feature. No bridging bone is seen
across the C4-5 disc interspace. There is a small amount of bridging
bone across C5-6. No vacuum disc phenomenon at either level.

## 2019-07-30 ENCOUNTER — Other Ambulatory Visit: Payer: Self-pay | Admitting: Allergy and Immunology

## 2019-08-03 ENCOUNTER — Telehealth: Payer: Self-pay | Admitting: Allergy and Immunology

## 2019-08-04 ENCOUNTER — Other Ambulatory Visit: Payer: Self-pay

## 2019-08-04 ENCOUNTER — Encounter: Payer: Self-pay | Admitting: Family Medicine

## 2019-08-04 ENCOUNTER — Ambulatory Visit: Payer: 59 | Admitting: Family Medicine

## 2019-08-04 VITALS — BP 114/60 | HR 90 | Temp 98.3°F | Resp 16 | Ht 71.0 in | Wt 263.8 lb

## 2019-08-04 DIAGNOSIS — L500 Allergic urticaria: Secondary | ICD-10-CM | POA: Diagnosis not present

## 2019-08-04 DIAGNOSIS — Z91018 Allergy to other foods: Secondary | ICD-10-CM | POA: Diagnosis not present

## 2019-08-04 DIAGNOSIS — J3089 Other allergic rhinitis: Secondary | ICD-10-CM

## 2019-08-04 MED ORDER — EPINEPHRINE 0.3 MG/0.3ML IJ SOAJ: INTRAMUSCULAR | 1 refills | Status: DC

## 2019-08-04 MED ORDER — MONTELUKAST SODIUM 10 MG PO TABS: ORAL_TABLET | ORAL | 3 refills | Status: DC

## 2019-08-04 MED ORDER — LEVOCETIRIZINE DIHYDROCHLORIDE 5 MG PO TABS: ORAL_TABLET | ORAL | 3 refills | Status: DC

## 2019-08-04 MED ORDER — FAMOTIDINE 20 MG PO TABS
ORAL_TABLET | ORAL | 3 refills | Status: DC
Start: 2019-08-04 — End: 2022-07-02

## 2019-08-04 NOTE — Progress Notes (Signed)
[place] [address] Dept: [phone]  FOLLOW UP NOTE  Patient ID: Wayne Wise, male    DOB: 1980/04/25  Age: 39 y.o. MRN: [id number] Date of Office Visit: 08/04/2019  Assessment  Chief Complaint: Urticaria  HPI ESVIN HNAT is a 39 year old male who presents to the clinic for follow-up visit.  He was last seen in this clinic on 03/25/2018 by Dr. Verlin Fester for evaluation of urticaria, allergy to alpha gal, and allergic rhinitis.  At today's visit, he reports his urticaria has been moderately well controlled with a breakout in different areas on his body occurring about 1 time a week.  He reports he takes levocetirizine, montelukast and Benadryl with each breakout with resolution occurring in about 1 hour.  He reports that he has recently been in contact with poison oak while mowing his yard while wearing crocs.  He reports some papular urticaria on both ankles for the last several days.  He denies cardiopulmonary or gastrointestinal symptoms associated with urticaria breakouts.  He reports that he is currently avoiding all mammalian meats and products and has an EpiPen available, however, he does not carry this at all times reporting that it is too bulky to carry.  He is not interested in retesting alpha gal level at this time.  Allergic rhinitis is reported as well controlled with no current medical intervention.  His current medications are listed in the chart.   Drug Allergies:  Allergies  Allergen Reactions  . Hydrocodone     Dizzy   . Other Hives    Beef,pork,bison,lamb,venison  . Poison Ivy Extract   . Sulfa Antibiotics Swelling    Swelling of fingers    Physical Exam: BP 114/60 (BP Location: Right Arm, Patient Position: Sitting, Cuff Size: Large)   Pulse 90   Temp 98.3 F (36.8 C) (Oral)   Resp 16   Ht 5\' 11"  (1.803 m)   Wt 263 lb 12.8 oz (119.7 kg)   SpO2 98%   BMI 36.79 kg/m    Physical Exam Vitals reviewed.  Constitutional:      Appearance:  Normal appearance.  HENT:     Head: Normocephalic and atraumatic.     Right Ear: Tympanic membrane normal.     Left Ear: Tympanic membrane normal.     Nose:     Comments: Bilateral nares normal.  Pharynx normal.  Ears normal.  Eyes normal.    Mouth/Throat:     Pharynx: Oropharynx is clear.  Eyes:     Conjunctiva/sclera: Conjunctivae normal.  Cardiovascular:     Rate and Rhythm: Normal rate and regular rhythm.     Heart sounds: No murmur heard.   Pulmonary:     Effort: Pulmonary effort is normal.     Breath sounds: Normal breath sounds.     Comments: Lungs clear to auscultation Musculoskeletal:        General: Normal range of motion.     Cervical back: Normal range of motion and neck supple.  Skin:    General: Skin is warm and dry.     Comments: 1 small erythematous papule noted on left ankle and scattered papules noted on right ankle.  No open areas.  No drainage noted.  Neurological:     Mental Status: He is alert.     Assessment and Plan: 1. Recurrent urticaria   2. Allergy to alpha-gal   3. Perennial allergic rhinitis     Meds ordered this encounter  Medications  . levocetirizine (  XYZAL) 5 MG tablet    Sig: Take 1 tablet once or twice a day as needed for itch and hives    Dispense:  90 tablet    Refill:  3    Please dispense 90 day supply.  . montelukast (SINGULAIR) 10 MG tablet    Sig: Take 1 tablet as needed for itch or hives    Dispense:  90 tablet    Refill:  3  . EPINEPHrine (AUVI-Q) 0.3 mg/0.3 mL IJ SOAJ injection    Sig: Use as directed for severe allergic reactions    Dispense:  2 each    Refill:  1  . famotidine (PEPCID) 20 MG tablet    Sig: 1 tablet 1-2 times a day if needed for itch or hives    Dispense:  180 tablet    Refill:  3    Pt. Will call if needed.    Patient Instructions  Urticaria (hives) Use the least amount of medications while maintaining the most relief of hives . Levocetirizine (Xyzal) 5 mg twice a day and famotidine (Pepcid)  20 mg twice a day. If no symptoms for 7-14 days then decrease to. . Levocetirizine (Xyzal) 5 mg twice a day and famotidine (Pepcid) 20 mg once a day.  If no symptoms for 7-14 days then decrease to. . Levocetirizine (Xyzal) 5 mg twice a day.  If no symptoms for 7-14 days then decrease to. . Levocetirizine (Xyzal) 5 mg once a day. Continue montelukast 10 mg once a day as needed for hives If your symptoms re-occur, begin a journal of events that occurred for up to 6 hours before your symptoms began including foods and beverages consumed, soaps or perfumes you had contact with, and medications.  You may use Benadryl 50 mg once every 4 hours as needed for breakthrough itch  Alpha gal allergy Continue to avoid mammalian meat. In case of an allergic reaction, take Benadryl 50 mg  every 4 hours, and if life-threatening symptoms occur, inject with AuviQ 0.3 mg.  Allergic rhinitis Continue levocetirizine 5 mg once a day as needed for a runny nose (as listed above) Continue Flonase 2 sprays in each nostril once a day as needed for a stuffy nose.  In the right nostril, point the applicator out toward the right ear. In the left nostril, point the applicator out toward the left ear Consider saline nasal rinses as needed for nasal symptoms. Use this before any medicated nasal sprays for best result Consider saline nasal rinses as needed for nasal symptoms. Use this before any medicated nasal sprays for best result  Follow up in 1 year or sooner if needed.   Return in about 1 year (around 08/03/2020), or if symptoms worsen or fail to improve.    Thank you for the opportunity to care for this patient.  Please do not hesitate to contact me with questions.  Gareth Morgan, FNP [facility]

## 2019-08-04 NOTE — Patient Instructions (Signed)
Urticaria (hives) Use the least amount of medications while maintaining the most relief of hives . Levocetirizine (Xyzal) 5 mg twice a day and famotidine (Pepcid) 20 mg twice a day. If no symptoms for 7-14 days then decrease to. . Levocetirizine (Xyzal) 5 mg twice a day and famotidine (Pepcid) 20 mg once a day.  If no symptoms for 7-14 days then decrease to. . Levocetirizine (Xyzal) 5 mg twice a day.  If no symptoms for 7-14 days then decrease to. . Levocetirizine (Xyzal) 5 mg once a day. Continue montelukast 10 mg once a day as needed for hives If your symptoms re-occur, begin a journal of events that occurred for up to 6 hours before your symptoms began including foods and beverages consumed, soaps or perfumes you had contact with, and medications.  You may use Benadryl 50 mg once every 4 hours as needed for breakthrough itch  Alpha gal allergy Continue to avoid mammalian meat. In case of an allergic reaction, take Benadryl 50 mg  every 4 hours, and if life-threatening symptoms occur, inject with AuviQ 0.3 mg.  Allergic rhinitis Continue levocetirizine 5 mg once a day as needed for a runny nose (as listed above) Continue Flonase 2 sprays in each nostril once a day as needed for a stuffy nose.  In the right nostril, point the applicator out toward the right ear. In the left nostril, point the applicator out toward the left ear Consider saline nasal rinses as needed for nasal symptoms. Use this before any medicated nasal sprays for best result Consider saline nasal rinses as needed for nasal symptoms. Use this before any medicated nasal sprays for best result  Follow up in 1 year or sooner if needed.

## 2019-08-16 ENCOUNTER — Telehealth: Payer: Self-pay | Admitting: Family

## 2019-08-16 DIAGNOSIS — M109 Gout, unspecified: Secondary | ICD-10-CM

## 2019-08-17 NOTE — Telephone Encounter (Signed)
Patient called to f/u on medication being denied. He states medication does work for his gout and would like it refilled.    Diclofenac Sodium 75 MG TAKE 1 TABLET(75 MG) BY MOUTH TWICE DAILY

## 2019-08-17 NOTE — Telephone Encounter (Signed)
Pt will need to make a follow=up appt w/Laura. She last saw him 08/2018. Overdue for appt.Wayne KitchenJohny Chess

## 2019-08-18 ENCOUNTER — Other Ambulatory Visit: Payer: Self-pay | Admitting: Family

## 2019-08-18 DIAGNOSIS — M109 Gout, unspecified: Secondary | ICD-10-CM

## 2019-08-19 ENCOUNTER — Ambulatory Visit: Payer: 59 | Admitting: Internal Medicine

## 2019-08-19 ENCOUNTER — Other Ambulatory Visit: Payer: Self-pay

## 2019-08-19 ENCOUNTER — Encounter: Payer: Self-pay | Admitting: Internal Medicine

## 2019-08-19 VITALS — BP 122/80 | HR 71 | Temp 98.1°F | Ht 71.0 in | Wt 264.0 lb

## 2019-08-19 DIAGNOSIS — Z7189 Other specified counseling: Secondary | ICD-10-CM | POA: Diagnosis not present

## 2019-08-19 DIAGNOSIS — M109 Gout, unspecified: Secondary | ICD-10-CM | POA: Diagnosis not present

## 2019-08-19 DIAGNOSIS — J3089 Other allergic rhinitis: Secondary | ICD-10-CM | POA: Diagnosis not present

## 2019-08-19 DIAGNOSIS — I10 Essential (primary) hypertension: Secondary | ICD-10-CM | POA: Diagnosis not present

## 2019-08-19 MED ORDER — DICLOFENAC SODIUM 75 MG PO TBEC: 75.0000 mg | DELAYED_RELEASE_TABLET | Freq: Two times a day (BID) | ORAL | 5 refills | Status: DC | PRN

## 2019-08-19 NOTE — Patient Instructions (Signed)
Please take all new medication as prescribed - the pain medication for the gout  Please continue all other medications as before, and refills have been done if requested.  Please have the pharmacy call with any other refills you may need.  Please continue your efforts at being more active, low cholesterol diet, and weight control.  Please keep your appointments with your specialists as you may have planned

## 2019-08-19 NOTE — Progress Notes (Signed)
Subjective:    Patient ID: Wayne Wise, male    DOB: 27-Feb-1980, 39 y.o.   MRN: [id number]  HPI  Here to f/u; overall doing ok,  Pt denies chest pain, increasing sob or doe, wheezing, orthopnea, PND, increased LE swelling, palpitations, dizziness or syncope.  Pt denies new neurological symptoms such as new headache, or facial or extremity weakness or numbness.  Pt denies polydipsia, polyuria, or low sugar episode.  Pt states overall good compliance with meds, mostly trying to follow appropriate diet, with wt overall stable,  but little exercise however.  Also with questions about covid 19 information and whether he should have this done.  Also c/o right foot first mtp red, tender, swelling x 3 days moderate constant, worse to stand, better to sit, nothing else makes better or worse  Does have several wks ongoing nasal allergy symptoms with clearish congestion, itch and sneezing, without fever, pain, ST, cough, swelling or wheezing. Past Medical History:  Diagnosis Date  . Arthritis   . Chicken pox as child  . Headache syndrome 07/01/2017  . 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 History of kidney stones   . Hypertension   . Migraines   . Sleep apnea    cpap autoset starts at 3  . TBI (traumatic brain injury) ([place]) 2011   Past Surgical History:  Procedure Laterality Date  . FRACTURE SURGERY Left 2012   Left clavicle  . KNEE ARTHROSCOPY WITH MEDIAL MENISECTOMY Left 01/13/2018   Procedure: KNEE ARTHROSCOPY WITH DEBRIDEMENT, PARTIAL MEDIAL MENISECTOMY AND PLATELET RICH PLASMA TO PATELLA TENDON, MEDIAL MENISCUS REPAIR;  Surgeon: Sydnee Cabal, MD;  Location: [facility];  Service: Orthopedics;  Laterality: Left;  . rod removed from left clavicle  2012    reports that he has never smoked. He has never used smokeless tobacco. He reports current alcohol use. He reports that he does not use drugs. family history includes Arthritis in his maternal grandmother and mother; Breast cancer  in his maternal grandmother and paternal grandmother; Diabetes in his maternal grandfather and maternal grandmother; Stroke in his maternal grandfather. Allergies  Allergen Reactions  . Hydrocodone     Dizzy   . Other Hives    Beef,pork,bison,lamb,venison  . Poison Ivy Extract   . Sulfa Antibiotics Swelling    Swelling of fingers   Current Outpatient Medications on File Prior to Visit  Medication Sig Dispense Refill  . EPINEPHrine (AUVI-Q) 0.3 mg/0.3 mL IJ SOAJ injection Use as directed for severe allergic reactions 2 each 1  . EPINEPHrine 0.3 mg/0.3 mL IJ SOAJ injection epinephrine 0.3 mg/0.3 mL injection, auto-injector    . famotidine (PEPCID) 20 MG tablet 1 tablet 1-2 times a day if needed for itch or hives 180 tablet 3  . levocetirizine (XYZAL) 5 MG tablet Take 1 tablet once or twice a day as needed for itch and hives 90 tablet 3  . montelukast (SINGULAIR) 10 MG tablet Take 1 tablet as needed for itch or hives 90 tablet 3  . olmesartan-hydrochlorothiazide (BENICAR HCT) 40-25 MG tablet Take by mouth.    . vitamin E 1000 UNIT capsule Take 1,000 Units by mouth daily.     No current facility-administered medications on file prior to visit.   Review of Systems All otherwise neg per pt     Objective:   Physical Exam BP 122/80 (BP Location: Left Arm, Patient Position: Sitting, Cuff Size: Large)   Pulse 71   Temp 98.1 F (36.7 C) (  Oral)   Ht 5\' 11"  (1.803 m)   Wt 264 lb (119.7 kg)   SpO2 97%   BMI 36.82 kg/m  VS noted,  Constitutional: Pt appears in NAD HENT: Head: NCAT.  Right Ear: External ear normal.  Left Ear: External ear normal.  Eyes: . Pupils are equal, round, and reactive to light. Conjunctivae and EOM are normal Nose: without d/c or deformity Neck: Neck supple. Gross normal ROM Cardiovascular: Normal rate and regular rhythm.   Pulmonary/Chest: Effort normal and breath sounds without rales or wheezing.  Abd:  Soft, NT, ND, + BS, no organomegaly Neurological: Pt  is alert. At baseline orientation, motor grossly intact Skin: Skin is warm. No rashes, other new lesions, no LE edema, right foot with 2+ first mtp red, tender, swelling Psychiatric: Pt behavior is normal without agitation  Lab Results  Component Value Date   WBC 8.1 03/26/2018   HGB 15.2 03/26/2018   HCT 44.8 03/26/2018   PLT 254 03/26/2018   GLUCOSE 78 03/26/2018   CHOL 190 06/20/2016   TRIG 164.0 (H) 01/12/2014   HDL 34 (A) 06/20/2016   LDLCALC 134 (H) 01/12/2014   ALT 47 (H) 03/26/2018   AST 21 03/26/2018   NA 140 03/26/2018   K 4.1 03/26/2018   CL 98 03/26/2018   CREATININE 0.90 03/26/2018   BUN 19 03/26/2018   CO2 23 03/26/2018   TSH 1.91 12/25/2016   INR 0.89 12/15/2009   HGBA1C 5.4 04/11/2017          Assessment & Plan:

## 2019-08-23 ENCOUNTER — Encounter: Payer: Self-pay | Admitting: Internal Medicine

## 2019-08-23 DIAGNOSIS — Z7189 Other specified counseling: Secondary | ICD-10-CM | POA: Insufficient documentation

## 2019-08-23 NOTE — Assessment & Plan Note (Signed)
stable overall by history and exam, recent data reviewed with pt, and pt to continue medical treatment as before,  to f/u any worsening symptoms or concerns  

## 2019-08-23 NOTE — Assessment & Plan Note (Addendum)
With acute flare, Mild to mod, for pain control,  to f/u any worsening symptoms or concerns  I spent 31 minutes in preparing to see the patient by review of recent labs, imaging and procedures, obtaining and reviewing separately obtained history, communicating with the patient and family or caregiver, ordering medications, tests or procedures, and documenting clinical information in the EHR including the differential Dx, treatment, and any further evaluation and other management of acute gouty arthritis, covid 19 education, htn, allergies

## 2019-08-23 NOTE — Assessment & Plan Note (Signed)
D/w pt, advised to have vaccination

## 2019-08-23 NOTE — Assessment & Plan Note (Signed)
To restart xyzal and singulair,  to f/u any worsening symptoms or concerns

## 2019-08-23 NOTE — Assessment & Plan Note (Deleted)
With acute flare, Mild to mod, for pain control,  to f/u any worsening symptoms or concerns

## 2019-11-19 ENCOUNTER — Other Ambulatory Visit: Payer: Self-pay | Admitting: Neurosurgery

## 2019-11-19 DIAGNOSIS — M542 Cervicalgia: Secondary | ICD-10-CM

## 2019-12-10 ENCOUNTER — Other Ambulatory Visit: Payer: Self-pay

## 2019-12-10 ENCOUNTER — Ambulatory Visit
Admission: RE | Admit: 2019-12-10 | Discharge: 2019-12-10 | Disposition: A | Discharge status: Home / Self Care | Payer: 59 | Source: Ambulatory Visit | Attending: Neurosurgery | Admitting: Neurosurgery

## 2019-12-10 DIAGNOSIS — M542 Cervicalgia: Secondary | ICD-10-CM

## 2019-12-10 IMAGING — MR MR CERVICAL SPINE W/O CM
5 series · 29 of 48 positions shown · non-contrast
Comparison: MRI of the cervical spine [DATE].

CLINICAL DATA: Cervical pain.

EXAM:
MRI CERVICAL SPINE WITHOUT CONTRAST
TECHNIQUE: Multiplanar, multisequence MR imaging of the cervical spine was
performed. No intravenous contrast was administered.

[Series 3: T2 · sagittal · 3.0mm · 0.82mm/px · 6 of 18 slices shown (1 of 2)]
[im 1/18]
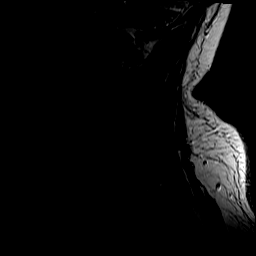
[im 4/18]
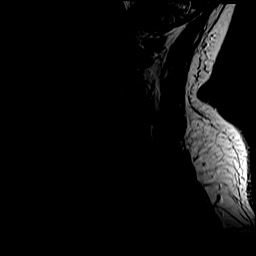
[im 7/18]
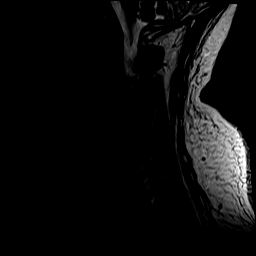
[im 11/18]
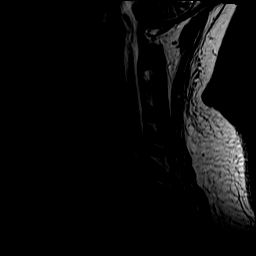
[im 14/18]
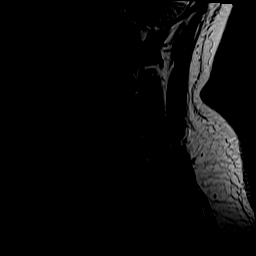
[im 18/18]
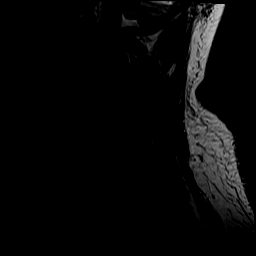

[Series 4: T1 · sagittal · 3.0mm · 0.41mm/px · 7 of 18 slices shown]
[im 1/18]
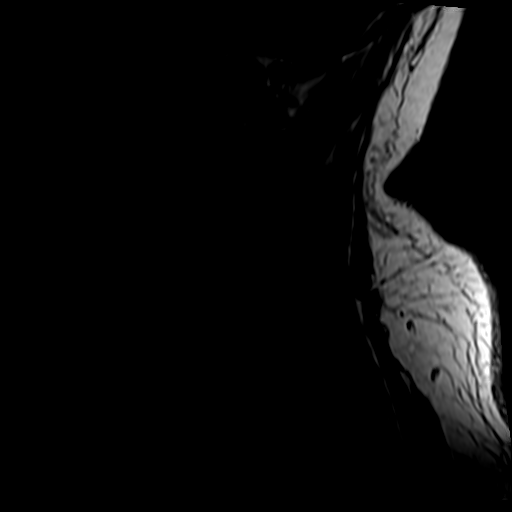
[im 3/18]
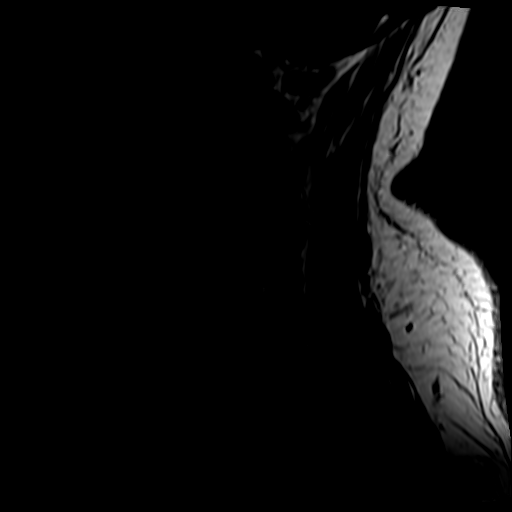
[im 6/18]
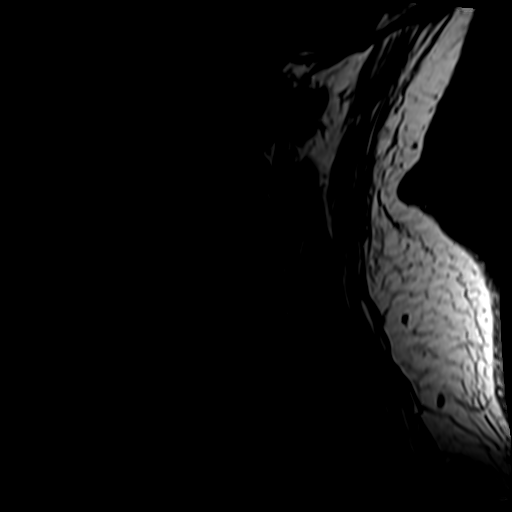
[im 9/18]
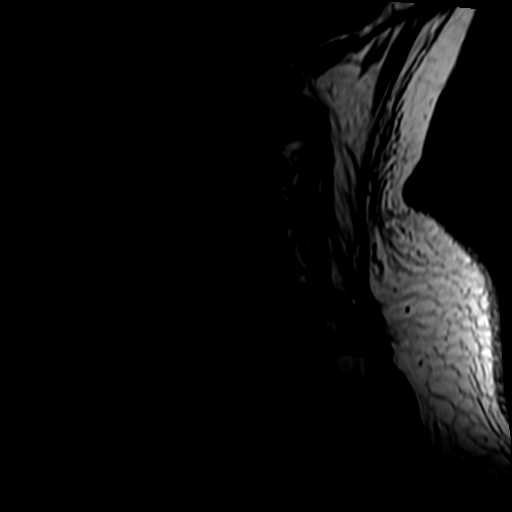
[im 12/18]
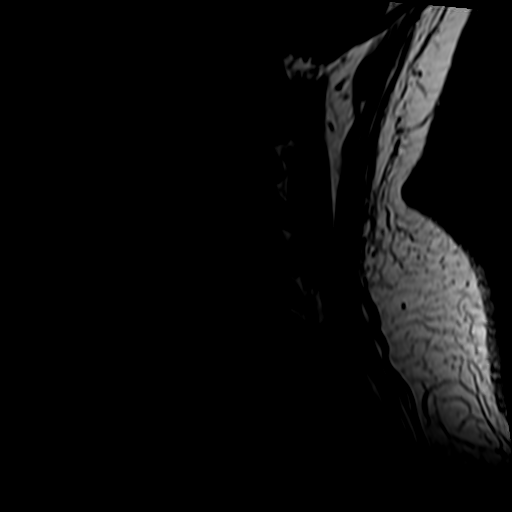
[im 15/18]
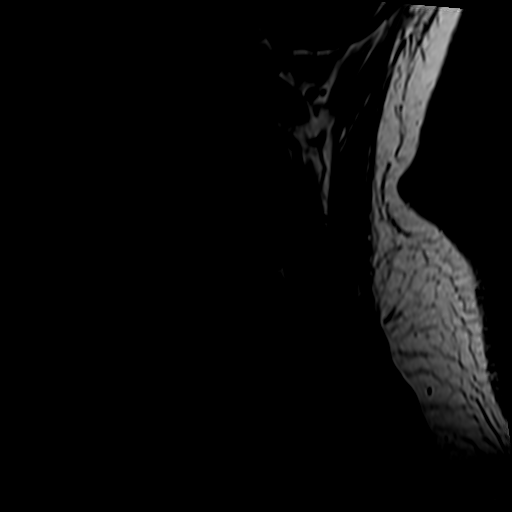
[im 18/18]
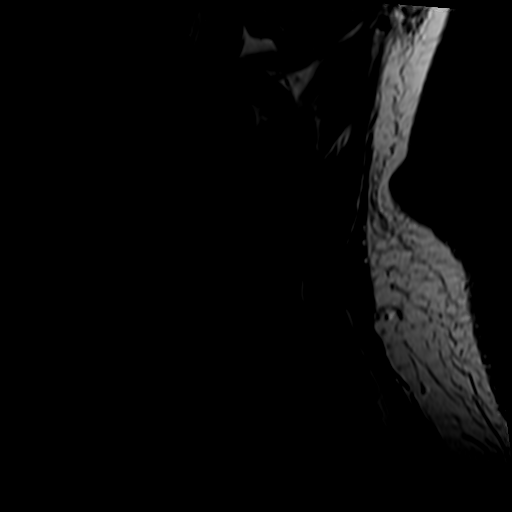

[Series 5: tir sag · sagittal · 3.0mm · 0.41mm/px · 7 of 18 slices shown]
[im 1/18]
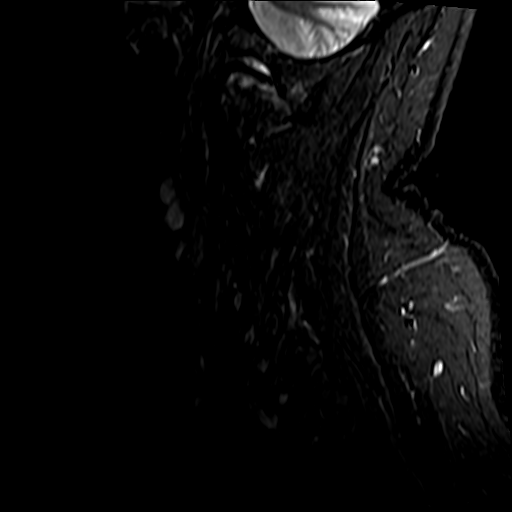
[im 3/18]
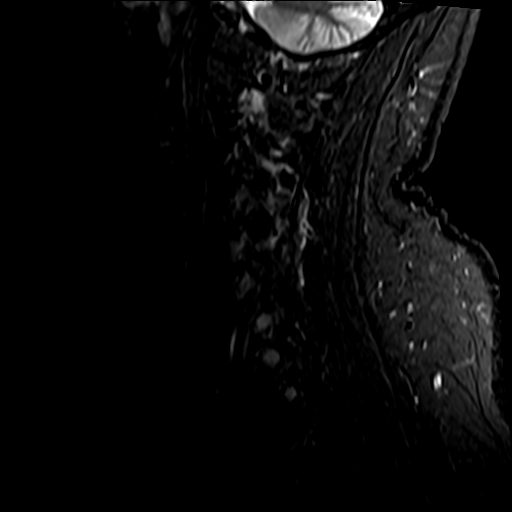
[im 6/18]
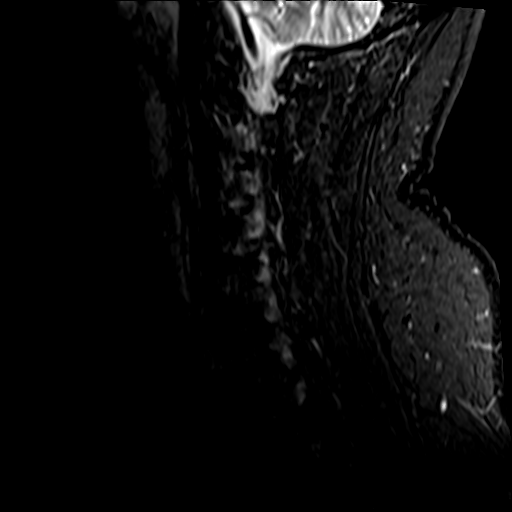
[im 9/18]
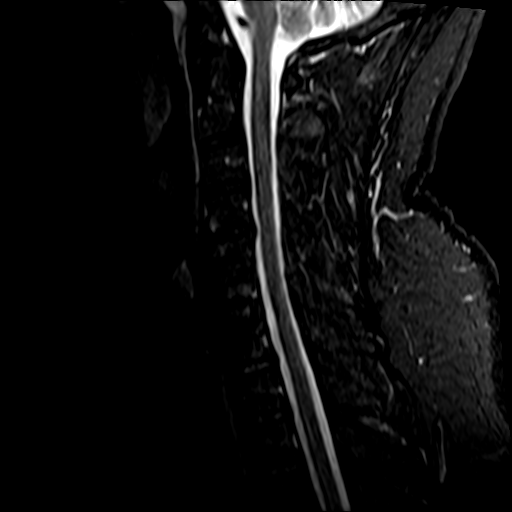
[im 12/18]
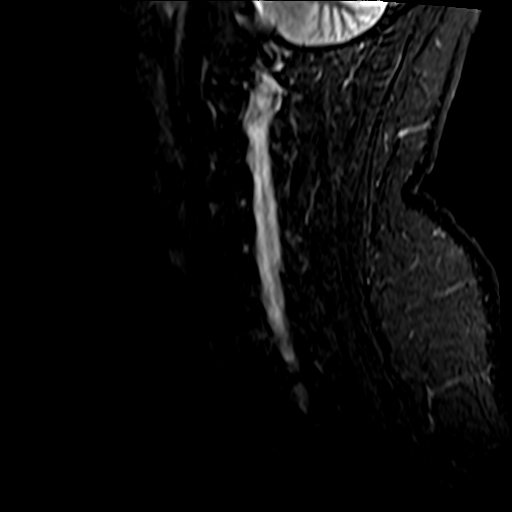
[im 15/18]
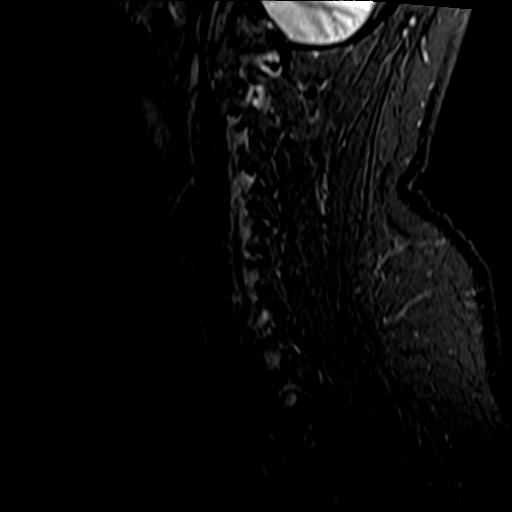
[im 18/18]
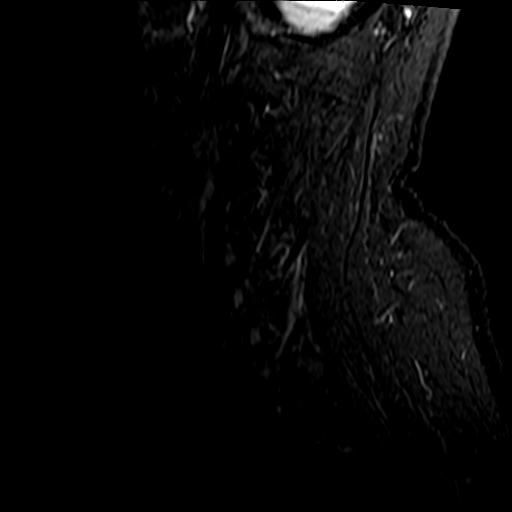

[Series 6: GRE · axial · 3.0mm · 0.35mm/px · 1 of 37 slices shown]
[im 1/37]
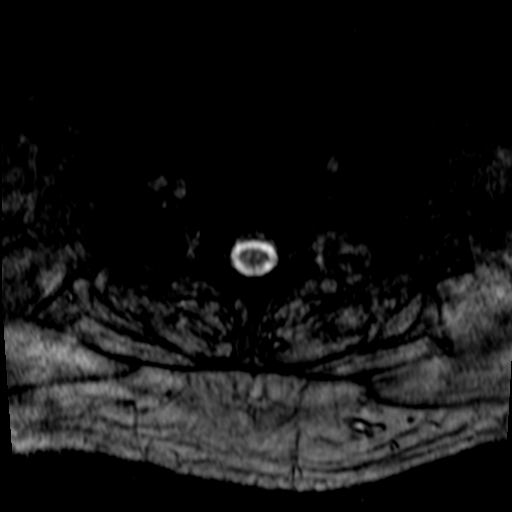

[Series 7: T2 · axial · 3.0mm · 0.70mm/px · z∈[-76,+59]mm · 8 of 37 slices shown (2 of 2)]
[im 1/37]
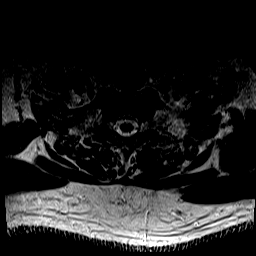
[im 6/37]
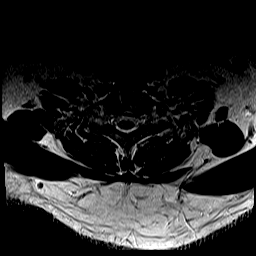
[im 12/37]
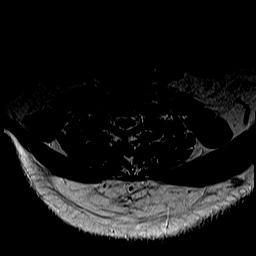
[im 17/37]
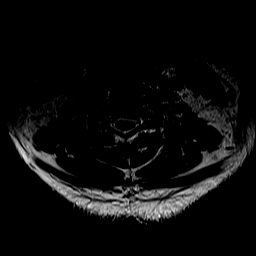
[im 20/37]
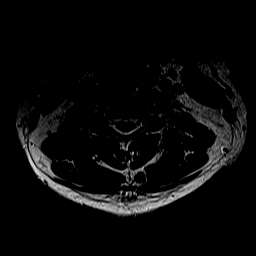
[im 25/37]
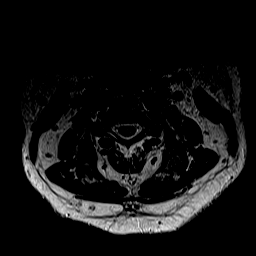
[im 31/37]
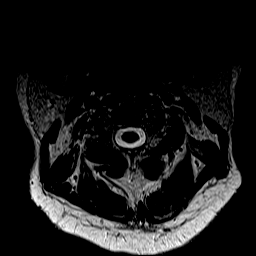
[im 37/37]
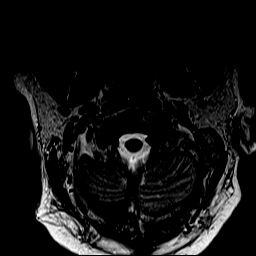

[29 of 48 positions shown; findings below may reference images not displayed]

FINDINGS: The study is degraded by motion.

Alignment: Straightening of the cervical curvature.

Vertebrae: No fracture, evidence of discitis, or bone lesion. Status
post C5-C6-C7 ACDF, new from prior MRI.

Cord: Evaluation limited by motion. No gross cord signal
abnormality.

Posterior Fossa, vertebral arteries, paraspinal tissues: Negative.

Disc levels:

C2-3: No spinal canal or neural foraminal stenosis.

C3-4: No spinal canal or neural foraminal stenosis.

C4-5: A left paracentral disc protrusion and uncovertebral
degenerative changes contributing for mild left neural foraminal
narrowing. No spinal canal stenosis.

C5-6: No spinal canal stenosis. Uncovertebral and facet degenerative
changes resulting in moderate bilateral neural foraminal narrowing,
improved from prior MRI.

C6-7: No spinal canal stenosis. Uncovertebral and facet degenerative
changes resulting in moderate bilateral neural foraminal narrowing,
left greater than right, improved from prior MRI.

C7-T1: No spinal canal or neural foraminal stenosis.
IMPRESSION: 1. Motion degraded study.
2. Status post C5-C6-C7 ACDF, new from prior MRI.
3. No spinal canal stenosis at any level.
4. Moderate bilateral neural foraminal narrowing at C5-6 and C6-7.

## 2019-12-24 ENCOUNTER — Other Ambulatory Visit: Payer: Self-pay | Admitting: Family

## 2019-12-29 ENCOUNTER — Ambulatory Visit: Payer: 59 | Admitting: Family

## 2019-12-29 ENCOUNTER — Other Ambulatory Visit: Payer: Self-pay | Admitting: Family

## 2019-12-29 ENCOUNTER — Other Ambulatory Visit: Payer: Self-pay

## 2019-12-29 ENCOUNTER — Encounter: Payer: Self-pay | Admitting: Family

## 2019-12-29 VITALS — BP 122/76 | HR 93 | Temp 98.1°F | Ht 71.0 in | Wt 262.0 lb

## 2019-12-29 DIAGNOSIS — Z0000 Encounter for general adult medical examination without abnormal findings: Secondary | ICD-10-CM | POA: Diagnosis not present

## 2019-12-29 DIAGNOSIS — R7303 Prediabetes: Secondary | ICD-10-CM

## 2019-12-29 DIAGNOSIS — Z13220 Encounter for screening for lipoid disorders: Secondary | ICD-10-CM | POA: Diagnosis not present

## 2019-12-29 DIAGNOSIS — M1A9XX0 Chronic gout, unspecified, without tophus (tophi): Secondary | ICD-10-CM

## 2019-12-29 DIAGNOSIS — Z23 Encounter for immunization: Secondary | ICD-10-CM | POA: Diagnosis not present

## 2019-12-29 LAB — LIPID PANEL
Cholesterol: 182 mg/dL (ref 0–200)
HDL: 26.4 mg/dL — ABNORMAL LOW (ref >39.00)
LDL Cholesterol: 121 mg/dL — ABNORMAL HIGH (ref 0–99)
NonHDL: 155.58
Total CHOL/HDL Ratio: 7
Triglycerides: 175 mg/dL — ABNORMAL HIGH (ref 0.0–149.0)
VLDL: 35 mg/dL (ref 0.0–40.0)

## 2019-12-29 LAB — CBC WITH DIFFERENTIAL/PLATELET
Basophils Absolute: 0.1 10*3/uL (ref 0.0–0.1)
Basophils Relative: 0.7 % (ref 0.0–3.0)
Eosinophils Absolute: 1.9 10*3/uL — ABNORMAL HIGH (ref 0.0–0.7)
Eosinophils Relative: 19.5 % — ABNORMAL HIGH (ref 0.0–5.0)
HCT: 47 % (ref 39.0–52.0)
Hemoglobin: 16.3 g/dL (ref 13.0–17.0)
Lymphocytes Relative: 17.7 % (ref 12.0–46.0)
Lymphs Abs: 1.8 10*3/uL (ref 0.7–4.0)
MCHC: 34.7 g/dL (ref 30.0–36.0)
MCV: 91.7 fl (ref 78.0–100.0)
Monocytes Absolute: 0.7 10*3/uL (ref 0.1–1.0)
Monocytes Relative: 7.4 % (ref 3.0–12.0)
Neutro Abs: 5.5 10*3/uL (ref 1.4–7.7)
Neutrophils Relative %: 54.7 % (ref 43.0–77.0)
Platelets: 268 10*3/uL (ref 150.0–400.0)
RBC: 5.13 Mil/uL (ref 4.22–5.81)
RDW: 12.6 % (ref 11.5–15.5)
WBC: 10 10*3/uL (ref 4.0–10.5)

## 2019-12-29 LAB — COMPREHENSIVE METABOLIC PANEL
ALT: 40 U/L (ref 0–53)
AST: 19 U/L (ref 0–37)
Albumin: 4.2 g/dL (ref 3.5–5.2)
Alkaline Phosphatase: 99 U/L (ref 39–117)
BUN: 20 mg/dL (ref 6–23)
CO2: 30 mEq/L (ref 19–32)
Calcium: 9.5 mg/dL (ref 8.4–10.5)
Chloride: 102 mEq/L (ref 96–112)
Creatinine, Ser: 0.96 mg/dL (ref 0.40–1.50)
GFR: 98.23 mL/min (ref >60.00)
Glucose, Bld: 97 mg/dL (ref 70–99)
Potassium: 3.7 mEq/L (ref 3.5–5.1)
Sodium: 139 mEq/L (ref 135–145)
Total Bilirubin: 0.4 mg/dL (ref 0.2–1.2)
Total Protein: 7.3 g/dL (ref 6.0–8.3)

## 2019-12-29 LAB — TSH: TSH: 1.71 u[IU]/mL (ref 0.35–4.50)

## 2019-12-29 LAB — URIC ACID: Uric Acid, Serum: 9 mg/dL — ABNORMAL HIGH (ref 4.0–7.8)

## 2019-12-29 LAB — HEMOGLOBIN A1C: Hgb A1c MFr Bld: 5.4 % (ref 4.6–6.5)

## 2019-12-29 MED ORDER — OLMESARTAN MEDOXOMIL-HCTZ 40-25 MG PO TABS: 1.0000 | ORAL_TABLET | Freq: Every day | ORAL | 3 refills | Status: DC

## 2019-12-29 MED ORDER — ALLOPURINOL 100 MG PO TABS: 100.0000 mg | ORAL_TABLET | Freq: Every day | ORAL | 0 refills | Status: DC
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
## 2020-01-03 ENCOUNTER — Other Ambulatory Visit: Payer: Self-pay | Admitting: Family

## 2020-01-03 ENCOUNTER — Telehealth: Payer: Self-pay | Admitting: Family

## 2020-01-03 MED ORDER — OLMESARTAN MEDOXOMIL-HCTZ 40-25 MG PO TABS: 1.0000 | ORAL_TABLET | Freq: Every day | ORAL | 3 refills | Status: DC

## 2020-01-03 NOTE — Telephone Encounter (Signed)
    Patient requesting olmesartan-hydrochlorothiazide (BENICAR HCT) 40-25 MG tablet be sent to [facility] [address] [place], [place]. AT [address]

## 2020-01-18 ENCOUNTER — Telehealth: Payer: Self-pay | Admitting: Family

## 2020-01-18 NOTE — Telephone Encounter (Signed)
Yes, try the combination of Diclofenac and Allopurinol together for another 2 weeks; then try taking the Allopurinol alone at that point; some times the gout will actually flare initially when starting the Allopurinol and this should settle down within the first 2-4 weeks of starting Allopurinol.

## 2020-01-18 NOTE — Telephone Encounter (Signed)
Patient has been advised:  Yes, try the combination of Diclofenac and Allopurinol together for another 2 weeks; then try taking the Allopurinol alone at that point; some times the gout will actually flare initially when starting the Allopurinol and this should settle down within the first 2-4 weeks of starting Allopurinol.

## 2020-01-18 NOTE — Telephone Encounter (Signed)
   Patient states he feels much better when taking diclofenac (VOLTAREN) 75 MG EC tablet with the allopurinol (ZYLOPRIM) 100 MG tablet. Patient states once he stopped the diclofenac sodium he began to have pain in his body due to gout. He wants to know if he should start taking again,  Pleas advise

## 2020-02-28 ENCOUNTER — Ambulatory Visit: Payer: 59 | Admitting: Family

## 2020-02-29 ENCOUNTER — Ambulatory Visit: Payer: 59 | Admitting: Family

## 2020-03-03 ENCOUNTER — Ambulatory Visit: Payer: 59 | Admitting: Family

## 2020-03-03 ENCOUNTER — Encounter: Payer: Self-pay | Admitting: Family

## 2020-03-03 ENCOUNTER — Other Ambulatory Visit: Payer: Self-pay

## 2020-03-03 VITALS — BP 124/72 | HR 93 | Temp 98.5°F | Ht 71.0 in | Wt 278.0 lb

## 2020-03-03 DIAGNOSIS — R053 Chronic cough: Secondary | ICD-10-CM | POA: Diagnosis not present

## 2020-03-03 DIAGNOSIS — M1A9XX0 Chronic gout, unspecified, without tophus (tophi): Secondary | ICD-10-CM

## 2020-03-03 LAB — CBC WITH DIFFERENTIAL/PLATELET
Basophils Absolute: 0.1 10*3/uL (ref 0.0–0.1)
Basophils Relative: 1.1 % (ref 0.0–3.0)
Eosinophils Absolute: 1.5 10*3/uL — ABNORMAL HIGH (ref 0.0–0.7)
Eosinophils Relative: 15.5 % — ABNORMAL HIGH (ref 0.0–5.0)
HCT: 44.8 % (ref 39.0–52.0)
Hemoglobin: 15.1 g/dL (ref 13.0–17.0)
Lymphocytes Relative: 21.4 % (ref 12.0–46.0)
Lymphs Abs: 2 10*3/uL (ref 0.7–4.0)
MCHC: 33.8 g/dL (ref 30.0–36.0)
MCV: 93.5 fl (ref 78.0–100.0)
Monocytes Absolute: 0.8 10*3/uL (ref 0.1–1.0)
Monocytes Relative: 8.2 % (ref 3.0–12.0)
Neutro Abs: 5.1 10*3/uL (ref 1.4–7.7)
Neutrophils Relative %: 53.8 % (ref 43.0–77.0)
Platelets: 262 10*3/uL (ref 150.0–400.0)
RBC: 4.79 Mil/uL (ref 4.22–5.81)
RDW: 13 % (ref 11.5–15.5)
WBC: 9.5 10*3/uL (ref 4.0–10.5)

## 2020-03-03 LAB — COMPREHENSIVE METABOLIC PANEL
ALT: 42 U/L (ref 0–53)
AST: 21 U/L (ref 0–37)
Albumin: 4.6 g/dL (ref 3.5–5.2)
Alkaline Phosphatase: 84 U/L (ref 39–117)
BUN: 25 mg/dL — ABNORMAL HIGH (ref 6–23)
CO2: 29 mEq/L (ref 19–32)
Calcium: 9.7 mg/dL (ref 8.4–10.5)
Chloride: 101 mEq/L (ref 96–112)
Creatinine, Ser: 1.22 mg/dL (ref 0.40–1.50)
GFR: 73.59 mL/min (ref >60.00)
Glucose, Bld: 86 mg/dL (ref 70–99)
Potassium: 3.7 mEq/L (ref 3.5–5.1)
Sodium: 137 mEq/L (ref 135–145)
Total Bilirubin: 0.4 mg/dL (ref 0.2–1.2)
Total Protein: 7.6 g/dL (ref 6.0–8.3)

## 2020-03-03 LAB — URIC ACID: Uric Acid, Serum: 7.7 mg/dL (ref 4.0–7.8)

## 2020-03-03 MED ORDER — INDOMETHACIN 50 MG PO CAPS: ORAL_CAPSULE | ORAL | 0 refills | Status: DC

## 2020-03-03 MED ORDER — LEVOCETIRIZINE DIHYDROCHLORIDE 5 MG PO TABS: ORAL_TABLET | ORAL | 3 refills | Status: DC
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
## 2020-03-03 NOTE — Patient Instructions (Signed)
Please try taking the Xyzal everyday to see if it helps with the cough; let me hear back in the next 2 weeks how this helps;  Stop allopurinol and stop Diclofenac; use Indomethacin 3 x per day as needed if a flare develops;

## 2020-03-06 ENCOUNTER — Telehealth: Payer: Self-pay

## 2020-03-06 ENCOUNTER — Other Ambulatory Visit: Payer: Self-pay | Admitting: Family

## 2020-03-06 DIAGNOSIS — M1A9XX0 Chronic gout, unspecified, without tophus (tophi): Secondary | ICD-10-CM

## 2020-03-06 NOTE — Telephone Encounter (Signed)
Pt wanted a f/u re: "fatty liver".

## 2020-03-07 NOTE — Telephone Encounter (Signed)
Pt notified of Laura's response & verb understanding.  Has no additional ques/concerns at this time.

## 2020-03-07 NOTE — Telephone Encounter (Signed)
He had a "body screen" done in December 2022 that showed pulmonary nodules and fatty liver;  Yes, he needs to work on his diet and exercise as we discussed in November; we will continue to check his liver functions yearly- they were normal with the set I checked last Friday. If the cholesterol numbers are not better when we re-check in November of this year, will need to discuss medication.

## 2020-03-09 ENCOUNTER — Telehealth: Payer: Self-pay

## 2020-03-09 NOTE — Telephone Encounter (Signed)
Received fax from [facility] who administers a retrospective intervention program to promote the safe & appropriate use of meds for patients who are [employer] members.  It identifies patients with potential clinical concerns that require PCP attention.  It does not take into account patient-specific variables that may factor into PCP's prescribing decisions.  The Olm/HCTZ tab 40-25mg  tab is identified as a potential clinical concern: Drug-Disease Interaction - Gout & thiazide/thiazide-like diuretics.  Thiazide & related diuretics may precipitate acute gout in certain patients by decreasing uric acid excretion.  Consider other anithypertensive alternatives for patients with frequent gout attacks, if possible.  Please review & d/c, if clinically warranted.

## 2020-04-11 ENCOUNTER — Telehealth: Payer: Self-pay | Admitting: Family

## 2020-04-11 NOTE — Telephone Encounter (Signed)
Last RF per controlled substance database: 11/26/18 by Sherwood Gambler (never been prescribed by PCP) Last OV: 03/03/20 Next OV: none scheduled

## 2020-04-11 NOTE — Telephone Encounter (Signed)
Pt notified that PCP not able to manage this medication for migraines.  Pt advised to seek neurology consult.  Pt verb understanding.

## 2020-04-11 NOTE — Telephone Encounter (Signed)
He would need to see his headache specialist or whoever was writing the Dilaudid. I would not be able to write this for him.

## 2020-04-11 NOTE — Telephone Encounter (Signed)
HYDROmorphone (DILAUDID) 4 MG tablet Patient is wanting to get a refill for this for his migraines since he only has one left. He said he wasn't sure if he needed to go to his headache doctor or come to you. He isn't sure who he used to see for his migraines he just knows it was a doctor on [address]. States hes fine with coming here just didn't want to make an appointment if we were just going to send him somewhere else.
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
## 2020-04-13 ENCOUNTER — Ambulatory Visit: Payer: 59 | Admitting: Internal Medicine

## 2020-04-13 ENCOUNTER — Encounter (INDEPENDENT_AMBULATORY_CARE_PROVIDER_SITE_OTHER): Payer: Self-pay

## 2020-04-13 ENCOUNTER — Ambulatory Visit: Payer: Self-pay

## 2020-04-13 ENCOUNTER — Other Ambulatory Visit: Payer: Self-pay

## 2020-04-13 ENCOUNTER — Encounter: Payer: Self-pay | Admitting: Internal Medicine

## 2020-04-13 VITALS — BP 149/84 | HR 101 | Ht 71.5 in | Wt 274.0 lb

## 2020-04-13 DIAGNOSIS — M25579 Pain in unspecified ankle and joints of unspecified foot: Secondary | ICD-10-CM | POA: Diagnosis not present

## 2020-04-13 DIAGNOSIS — M109 Gout, unspecified: Secondary | ICD-10-CM | POA: Diagnosis not present

## 2020-04-13 DIAGNOSIS — M79672 Pain in left foot: Secondary | ICD-10-CM

## 2020-04-13 DIAGNOSIS — M79671 Pain in right foot: Secondary | ICD-10-CM

## 2020-04-13 LAB — BASIC METABOLIC PANEL
BUN: 22 mg/dL (ref 7–25)
CO2: 30 mmol/L (ref 20–32)
Calcium: 9.8 mg/dL (ref 8.6–10.3)
Chloride: 103 mmol/L (ref 98–110)
Creat: 0.89 mg/dL (ref 0.60–1.35)
Glucose, Bld: 78 mg/dL (ref 65–99)
Potassium: 4 mmol/L (ref 3.5–5.3)
Sodium: 142 mmol/L (ref 135–146)

## 2020-04-13 LAB — URIC ACID: Uric Acid, Serum: 8.3 mg/dL — ABNORMAL HIGH (ref 4.0–8.0)

## 2020-04-13 NOTE — Patient Instructions (Addendum)
Gout  Gout is a condition that causes painful swelling of the joints. Gout is a type of inflammation of the joints (arthritis). This condition is caused by having too much uric acid in the body. Uric acid is a chemical that forms when the body breaks down substances called purines. Purines are important for building body proteins. When the body has too much uric acid, sharp crystals can form and build up inside the joints. This causes pain and swelling. Gout attacks can happen quickly and may be very painful (acute gout). Over time, the attacks can affect more joints and become more frequent (chronic gout). Gout can also cause uric acid to build up under the skin and inside the kidneys. What are the causes? This condition is caused by too much uric acid in your blood. This can happen because:  Your kidneys do not remove enough uric acid from your blood. This is the most common cause.  Your body makes too much uric acid. This can happen with some cancers and cancer treatments. It can also occur if your body is breaking down too many red blood cells (hemolytic anemia).  You eat too many foods that are high in purines. These foods include organ meats and some seafood. Alcohol, especially beer, is also high in purines. A gout attack may be triggered by trauma or stress. What increases the risk? You are more likely to develop this condition if you:  Have a family history of gout.  Are male and middle-aged.  Are male and have gone through menopause.  Are obese.  Frequently drink alcohol, especially beer.  Are dehydrated.  Lose weight too quickly.  Have an organ transplant.  Have lead poisoning.  Take certain medicines, including aspirin, cyclosporine, diuretics, levodopa, and niacin.  Have kidney disease.  Have a skin condition called psoriasis. What are the signs or symptoms? An attack of acute gout happens quickly. It usually occurs in just one joint. The most common place  is the big toe. Attacks often start at night. Other joints that may be affected include joints of the feet, ankle, knee, fingers, wrist, or elbow. Symptoms of this condition may include:  Severe pain.  Warmth.  Swelling.  Stiffness.  Tenderness. The affected joint may be very painful to touch.  Shiny, red, or purple skin.  Chills and fever. Chronic gout may cause symptoms more frequently. More joints may be involved. You may also have white or yellow lumps (tophi) on your hands or feet or in other areas near your joints.   How is this diagnosed? This condition is diagnosed based on your symptoms, medical history, and physical exam. You may have tests, such as:  Blood tests to measure uric acid levels.  Removal of joint fluid with a thin needle (aspiration) to look for uric acid crystals.  X-rays to look for joint damage. How is this treated? Treatment for this condition has two phases: treating an acute attack and preventing future attacks. Acute gout treatment may include medicines to reduce pain and swelling, including:  NSAIDs.  Steroids. These are strong anti-inflammatory medicines that can be taken by mouth (orally) or injected into a joint.  Colchicine. This medicine relieves pain and swelling when it is taken soon after an attack. It can be given by mouth or through an IV. Preventive treatment may include:  Daily use of smaller doses of NSAIDs or colchicine.  Use of a medicine that reduces uric acid levels in your blood.  Changes to your  diet. You may need to see a dietitian about what to eat and drink to prevent gout. Follow these instructions at home: During a gout attack  If directed, put ice on the affected area: ? Put ice in a plastic bag. ? Place a towel between your skin and the bag. ? Leave the ice on for 20 minutes, 2-3 times a day.  Raise (elevate) the affected joint above the level of your heart as often as possible.  Rest the joint as much as  possible. If the affected joint is in your leg, you may be given crutches to use.  Follow instructions from your health care provider about eating or drinking restrictions.   Avoiding future gout attacks  Follow a low-purine diet as told by your dietitian or health care provider. Avoid foods and drinks that are high in purines, including liver, kidney, anchovies, asparagus, herring, mushrooms, mussels, and beer.  Maintain a healthy weight or lose weight if you are overweight. If you want to lose weight, talk with your health care provider. It is important that you do not lose weight too quickly.  Start or maintain an exercise program as told by your health care provider. Eating and drinking  Drink enough fluids to keep your urine pale yellow.  If you drink alcohol: ? Limit how much you use to:  0-1 drink a day for women.  0-2 drinks a day for men. ? Be aware of how much alcohol is in your drink. In the [place] one drink equals one 12 oz bottle of beer (355 mL) one 5 oz glass of wine (148 mL), or one 1 oz glass of hard liquor (44 mL). General instructions  Take over-the-counter and prescription medicines only as told by your health care provider.  Do not drive or use heavy machinery while taking prescription pain medicine.  Return to your normal activities as told by your health care provider. Ask your health care provider what activities are safe for you.  Keep all follow-up visits as told by your health care provider. This is important. Contact a health care provider if you have:  Another gout attack.  Continuing symptoms of a gout attack after 10 days of treatment.  Side effects from your medicines.  Chills or a fever.  Burning pain when you urinate.  Pain in your lower back or belly. Get help right away if you:  Have severe or uncontrolled pain.  Cannot urinate. Summary  Gout is painful swelling of the joints caused by inflammation.  The most common site of pain  is the big toe, but it can affect other joints in the body.  Medicines and dietary changes can help to prevent and treat gout attacks.    Low-Purine Eating Plan A low-purine eating plan involves making food choices to limit your intake of purine. Purine is a kind of uric acid. Too much uric acid in your blood can cause certain conditions, such as gout and kidney stones. Eating a low-purine diet can help control these conditions. What are tips for following this plan? Reading food labels  Avoid foods with saturated or Trans fat.  Check the ingredient list of grains-based foods, such as bread and cereal, to make sure that they contain whole grains.  Check the ingredient list of sauces or soups to make sure they do not contain meat or fish.  When choosing soft drinks, check the ingredient list to make sure they do not contain high-fructose corn syrup. Shopping  Buy plenty of  fresh fruits and vegetables.  Avoid buying canned or fresh fish.  Buy dairy products labeled as low-fat or nonfat.  Avoid buying premade or processed foods. These foods are often high in fat, salt (sodium), and added sugar.   Cooking  Use olive oil instead of butter when cooking. Oils like olive oil, canola oil, and sunflower oil contain healthy fats. Meal planning  Learn which foods do or do not affect you. If you find out that a food tends to cause your gout symptoms to flare up, avoid eating that food. You can enjoy foods that do not cause problems. If you have any questions about a food item, talk with your dietitian or health care provider.  Limit foods high in fat, especially saturated fat. Fat makes it harder for your body to get rid of uric acid.  Choose foods that are lower in fat and are lean sources of protein. General guidelines  Limit alcohol intake to no more than 1 drink a day for nonpregnant women and 2 drinks a day for men. One drink equals 12 oz of beer, 5 oz of wine, or 1 oz of hard liquor.  Alcohol can affect the way your body gets rid of uric acid.  Drink plenty of water to keep your urine clear or pale yellow. Fluids can help remove uric acid from your body.  If directed by your health care provider, take a vitamin C supplement.  Work with your health care provider and dietitian to develop a plan to achieve or maintain a healthy weight. Losing weight can help reduce uric acid in your blood. What foods are recommended? The items listed may not be a complete list. Talk with your dietitian about what dietary choices are best for you. Foods low in purines Foods low in purines do not need to be limited. These include:  All fruits.  All low-purine vegetables, pickles, and olives.  Breads, pasta, rice, cornbread, and popcorn. Cake and other baked goods.  All dairy foods.  Eggs, nuts, and nut butters.  Spices and condiments, such as salt, herbs, and vinegar.  Plant oils, butter, and margarine.  Water, sugar-free soft drinks, tea, coffee, and cocoa.  Vegetable-based soups, broths, sauces, and gravies. Foods moderate in purines Foods moderate in purines should be limited to the amounts listed.   cup of asparagus, cauliflower, spinach, mushrooms, or green peas, each day.  2/3 cup uncooked oatmeal, each day.   cup dry wheat bran or wheat germ, each day.  2-3 ounces of meat or poultry, each day.  4-6 ounces of shellfish, such as crab, lobster, oysters, or shrimp, each day.  1 cup cooked beans, peas, or lentils, each day.  Soup, broths, or bouillon made from meat or fish. Limit these foods as much as possible. What foods are not recommended? The items listed may not be a complete list. Talk with your dietitian about what dietary choices are best for you. Limit your intake of foods high in purines, including:  Beer and other alcohol.  Meat-based gravy or sauce.  Canned or fresh fish, such as: ? Anchovies, sardines, herring, and tuna. ? Mussels and  scallops. ? Codfish, trout, and haddock.  Berniece Salines.  Organ meats, such as: ? Liver or kidney. ? Tripe. ? Sweetbreads (thymus gland or pancreas).  Wild [employer] biochemist.  Yeast or yeast extract supplements.  Drinks sweetened with high-fructose corn syrup. Summary  Eating a low-purine diet can help control conditions caused by too much uric acid in the  body, such as gout or kidney stones.  Choose low-purine foods, limit alcohol, and limit foods high in fat.  You will learn over time which foods do or do not affect you. If you find out that a food tends to cause your gout symptoms to flare up, avoid eating that food. This information is not intended to replace advice given to you by your health care provider. Make sure you discuss any questions you have with your health care provider. Document Revised: 05/13/2019 Document Reviewed: 05/13/2019 Elsevier Patient Education  2021 [employer].

## 2020-04-14 NOTE — Progress Notes (Signed)
Uric acid level is 8.3 similar to the 7.7 from last month. His kidney function looks fine. I do not see any visible gout-related damage to the joints of either foot. Based on this I do not see an urgent need to restart uric acid lower medication. If he starts to have repeat gout attacks again more often than once every few months or that fail to resolve with the indomethacin I recommend he let us know and we can see him and try other treatment options.

## 2020-04-16 ENCOUNTER — Encounter: Payer: Self-pay | Admitting: Family

## 2020-11-03 ENCOUNTER — Telehealth: Payer: Self-pay | Admitting: Family

## 2020-11-03 NOTE — Telephone Encounter (Signed)
I have called the pt to gather more information. Pt reports that his wife has been tested and she is neg for covid. His sx are HA, cough, Stuffy nose. He says that he has a trip coming up and wanted to get better before then. I have informed him that if he doesn't feel well it is not a good Idea to travel and spread what he may or may not have. He said "that makes sense".   I have informed him that we are unable to give out antibiotics without evaluation. Since he has an appointment on Monday he should keep it. If his sx get worse through the weekend then go to urgent care. He reported understanding.   Provider notify and agree with plan.

## 2020-11-03 NOTE — Telephone Encounter (Signed)
Pt called and sched a vv with [facility] on 9/26. Him and his wife both or experiencing covid symptoms. However his wife tested neg for flu/covid. Pt has not took covid test. But wants to know if he can be prescribed Z-Pak. Please advise.

## 2020-11-04 ENCOUNTER — Other Ambulatory Visit: Payer: Self-pay

## 2020-11-04 ENCOUNTER — Emergency Department
Admission: EM | Admit: 2020-11-04 | Discharge: 2020-11-04 | Disposition: A | Discharge status: Home / Self Care | Payer: 59 | Source: Home / Self Care

## 2020-11-04 DIAGNOSIS — J309 Allergic rhinitis, unspecified: Secondary | ICD-10-CM | POA: Diagnosis not present

## 2020-11-04 DIAGNOSIS — R059 Cough, unspecified: Secondary | ICD-10-CM | POA: Diagnosis not present

## 2020-11-04 DIAGNOSIS — J0100 Acute maxillary sinusitis, unspecified: Secondary | ICD-10-CM | POA: Diagnosis not present

## 2020-11-04 MED ORDER — METHYLPREDNISOLONE SODIUM SUCC 125 MG IJ SOLR
125.0000 mg | Freq: Once | INTRAMUSCULAR | Status: AC
  Administered 2020-11-04: 125 mg via INTRAMUSCULAR

## 2020-11-04 MED ORDER — CEFDINIR 300 MG PO CAPS: 300.0000 mg | ORAL_CAPSULE | Freq: Two times a day (BID) | ORAL | 0 refills | Status: AC

## 2020-11-04 MED ORDER — FEXOFENADINE HCL 180 MG PO TABS: 180.0000 mg | ORAL_TABLET | Freq: Every day | ORAL | 0 refills | Status: DC
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
## 2020-11-04 NOTE — ED Triage Notes (Signed)
Pt states that he has a cough, nasal congestion, headache and chest congestion. X4 days

## 2020-11-04 NOTE — Discharge Instructions (Addendum)
Advised/instructed patient to take medication as directed with food to completion.  Advised/encouraged patient to take Allegra daily with first dose of antibiotic for the next 7 days, then as needed for concurrent postnasal drip/drainage.  Encouraged patient to increase daily water intake while taking these medications.

## 2020-11-06 ENCOUNTER — Other Ambulatory Visit: Payer: Self-pay

## 2020-11-06 ENCOUNTER — Telehealth: Payer: 59 | Admitting: Family Medicine

## 2020-11-13 ENCOUNTER — Encounter: Payer: Self-pay | Admitting: Dermatology

## 2020-11-13 ENCOUNTER — Ambulatory Visit: Payer: 59 | Admitting: Dermatology

## 2020-11-13 ENCOUNTER — Other Ambulatory Visit: Payer: Self-pay

## 2020-11-13 DIAGNOSIS — D2262 Melanocytic nevi of left upper limb, including shoulder: Secondary | ICD-10-CM | POA: Diagnosis not present

## 2020-11-13 DIAGNOSIS — L821 Other seborrheic keratosis: Secondary | ICD-10-CM | POA: Diagnosis not present

## 2020-11-13 DIAGNOSIS — L918 Other hypertrophic disorders of the skin: Secondary | ICD-10-CM

## 2020-11-13 DIAGNOSIS — L720 Epidermal cyst: Secondary | ICD-10-CM | POA: Diagnosis not present

## 2020-11-13 DIAGNOSIS — Z1283 Encounter for screening for malignant neoplasm of skin: Secondary | ICD-10-CM | POA: Diagnosis not present

## 2020-11-25 ENCOUNTER — Encounter: Payer: Self-pay | Admitting: Dermatology

## 2020-11-25 NOTE — Progress Notes (Signed)
   New Patient   Subjective  Wayne Wise is a 40 y.o. male who presents for the following: New Patient (Initial Visit) (Patient here today for skin check per his wife no concerns. Per patient no personal history or family history of atypical moles, melanoma or non mole skin cancer. ).  General skin examination Location:  Duration:  Quality:  Associated Signs/Symptoms: Modifying Factors:  Severity:  Timing: Context:    The following portions of the chart were reviewed this encounter and updated as appropriate:  Tobacco  Allergies  Meds  Problems  Med Hx  Surg Hx  Fam Hx      Objective  Well appearing patient in no apparent distress; mood and affect are within normal limits. Head Full body skin check right upper eyelid milium, keratosis on the back, true mole under the left arm. Skin tags under the arms.  No atypical pigmented lesions.  All moles checked with dermoscopy.  No sign nonmelanoma skin cancer.    A full examination was performed including scalp, head, eyes, ears, nose, lips, neck, chest, axillae, abdomen, back, buttocks, bilateral upper extremities, bilateral lower extremities, hands, feet, fingers, toes, fingernails, and toenails. All findings within normal limits unless otherwise noted below.   Assessment & Plan  Screening exam for skin cancer Head  Keep yearly skin checks no skin cancer or atypia found today.  Self examine with spouse twice annually.  Continue ultraviolet protection.

## 2020-12-04 ENCOUNTER — Emergency Department
Admission: EM | Admit: 2020-12-04 | Discharge: 2020-12-04 | Disposition: A | Discharge status: Home / Self Care | Payer: 59 | Source: Home / Self Care | Attending: Family Medicine | Admitting: Family Medicine

## 2020-12-04 ENCOUNTER — Other Ambulatory Visit: Payer: Self-pay

## 2020-12-04 DIAGNOSIS — J3089 Other allergic rhinitis: Secondary | ICD-10-CM

## 2020-12-04 DIAGNOSIS — J0101 Acute recurrent maxillary sinusitis: Secondary | ICD-10-CM | POA: Diagnosis not present

## 2020-12-04 DIAGNOSIS — R0981 Nasal congestion: Secondary | ICD-10-CM

## 2020-12-04 MED ORDER — AMOXICILLIN-POT CLAVULANATE 875-125 MG PO TABS: 1.0000 | ORAL_TABLET | Freq: Two times a day (BID) | ORAL | 0 refills | Status: AC

## 2020-12-04 MED ORDER — METHYLPREDNISOLONE SODIUM SUCC 125 MG IJ SOLR
125.0000 mg | Freq: Once | INTRAMUSCULAR | Status: AC
  Administered 2020-12-04: 125 mg via INTRAMUSCULAR
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
## 2020-12-04 NOTE — ED Triage Notes (Signed)
Pt presents with HA and nasal drainage for several days.

## 2020-12-04 NOTE — Discharge Instructions (Signed)
Continue Claritin for allergies. Try warm salt water gargles for sore throat.  Stop all antihistamines for now, and other non-prescription cough/cold preparations. Begin Augmentin if not improving about one week or if persistent fever develops

## 2021-01-08 ENCOUNTER — Encounter: Payer: Self-pay | Admitting: Family Medicine

## 2021-01-08 ENCOUNTER — Telehealth (INDEPENDENT_AMBULATORY_CARE_PROVIDER_SITE_OTHER): Payer: 59 | Admitting: Family Medicine

## 2021-01-08 DIAGNOSIS — J0110 Acute frontal sinusitis, unspecified: Secondary | ICD-10-CM | POA: Diagnosis not present

## 2021-01-08 MED ORDER — FLUTICASONE PROPIONATE 50 MCG/ACT NA SUSP: 2.0000 | Freq: Every day | NASAL | 2 refills | Status: DC

## 2021-01-08 MED ORDER — AMOXICILLIN-POT CLAVULANATE 875-125 MG PO TABS: 1.0000 | ORAL_TABLET | Freq: Two times a day (BID) | ORAL | 0 refills | Status: AC

## 2021-01-08 NOTE — Progress Notes (Signed)
Chief Complaint  Patient presents with   Cough    congestion    Wayne Wise here for URI complaints. Due to COVID-19 pandemic, we are interacting via web portal for an electronic face-to-face visit. I verified patient's ID using 2 identifiers. Patient agreed to proceed with visit via this method. Patient is at home, I am at office. Patient and I are present for visit.   Duration: 3 days  Associated symptoms: sinus headache, sinus congestion, sinus pain, rhinorrhea, ear fullness, sore throat, and coughing, hearing Denies: itchy watery eyes, ear pain, ear drainage, wheezing, shortness of breath, myalgia, and fevers Treatment to date: Theraflu Sick contacts: Yes; son + for flu Has not tested for covid.   Past Medical History:  Diagnosis Date   Arthritis    Carpal tunnel syndrome    Bilateral   Chicken pox as child   Gout    Headache syndrome 07/01/2017   Headaches due to old head trauma    History of kidney stones    Hypertension    Migraines    Sleep apnea    cpap autoset starts at 3   TBI (traumatic brain injury) 2011    Objective No conversational dyspnea Age appropriate judgment and insight Nml affect and mood  Acute frontal sinusitis, recurrence not specified - Plan: fluticasone (FLONASE) 50 MCG/ACT nasal spray, amoxicillin-clavulanate (AUGMENTIN) 875-125 MG tablet  BID INCS. If no improvement in 3 d, will take Augmentin which has been sent as pocket rx.  Continue to push fluids, practice good hand hygiene, cover mouth when coughing. F/u prn. If starting to experience fevers, shaking, or shortness of breath, seek immediate care. Pt voiced understanding and agreement to the plan.  [place], DO 01/08/21 9:15 AM

## 2021-01-26 ENCOUNTER — Telehealth: Payer: Self-pay | Admitting: Family

## 2021-01-26 MED ORDER — OLMESARTAN MEDOXOMIL-HCTZ 40-25 MG PO TABS: 1.0000 | ORAL_TABLET | Freq: Every day | ORAL | 0 refills | Status: DC

## 2021-01-26 NOTE — Telephone Encounter (Signed)
Medication:  olmesartan-hydrochlorothiazide (BENICAR HCT) 40-25 MG tablet  Has the patient contacted their pharmacy? No. (If no, request that the patient contact the pharmacy for the refill.) (If yes, when and what did the pharmacy advise?)  Preferred Pharmacy (with phone number or street name): [facility] [address] [place], [place]. AT [place]  [address] [place] [address]  Phone:  [phone]  Fax:  [phone]   Agent: Please be advised that RX refills may take up to 3 business days. We ask that you follow-up with your pharmacy.

## 2021-01-26 NOTE — Telephone Encounter (Signed)
Rx refilled for 30 day. Must keep upcoming OV.

## 2021-01-30 ENCOUNTER — Ambulatory Visit: Payer: 59 | Admitting: Family

## 2021-01-30 ENCOUNTER — Encounter: Payer: Self-pay | Admitting: Family

## 2021-01-30 VITALS — BP 150/70 | HR 100 | Temp 98.3°F | Ht 72.0 in | Wt 292.0 lb

## 2021-01-30 DIAGNOSIS — I10 Essential (primary) hypertension: Secondary | ICD-10-CM

## 2021-01-30 MED ORDER — VALSARTAN-HYDROCHLOROTHIAZIDE 160-25 MG PO TABS: 1.0000 | ORAL_TABLET | Freq: Every day | ORAL | 0 refills | Status: DC
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
## 2021-02-26 ENCOUNTER — Telehealth: Payer: Self-pay

## 2021-02-26 NOTE — Telephone Encounter (Signed)
I have called pt to offer him an appointment and he stated that he did not want the appt since he is getting better. He will call us otherwise.

## 2021-02-26 NOTE — Telephone Encounter (Signed)
Call Who Is Calling Patient / Member / Family / Caregiver Call Type Triage / Clinical Relationship To Patient Self Return Phone Number [phone] (Primary) Chief Complaint Headache Reason for Call Symptomatic / Request for Health Information Initial Comment Caller states covid positive. Headache, congestion , tired and cough, ears are aching and body aches. No chest pain. Location confirmed. ear very plugged. Translation No Nurse Assessment Nurse: Brion Aliment, RN, Mickel Baas Date/Time Eilene Ghazi Time): 02/24/2021 3:07:26 PM Confirm and document reason for call. If symptomatic, describe symptoms. ---Caller states covid positive tested today, symptoms started Thursday. Symptoms include headache, congestion , tired and cough, ears are aching and body aches. No chest pain, ear very plugged, can't hear. Current temporal temp 97.3. No medications taken.

## 2021-03-04 ENCOUNTER — Encounter: Payer: Self-pay | Admitting: Family

## 2021-03-12 ENCOUNTER — Encounter: Payer: Self-pay | Admitting: Family

## 2021-03-12 ENCOUNTER — Telehealth: Payer: Self-pay | Admitting: Family

## 2021-03-12 MED ORDER — LEVOCETIRIZINE DIHYDROCHLORIDE 5 MG PO TABS: ORAL_TABLET | ORAL | 1 refills | Status: DC

## 2021-03-12 NOTE — Telephone Encounter (Signed)
Medication:  levocetirizine (XYZAL) 5 MG tablet [[id number]]    Has the patient contacted their pharmacy? No. (If no, request that the patient contact the pharmacy for the refill.) (If yes, when and what did the pharmacy advise?)     Preferred Pharmacy (with phone number or street name):  [facility] [address] [place], [place]. AT [place]  [address] [place] [address]  Phone:  [phone]  Fax:  [phone]     Agent: Please be advised that RX refills may take up to 3 business days. We ask that you follow-up with your pharmacy.

## 2021-03-13 NOTE — Telephone Encounter (Signed)
Rx has been sent to pharmacy yesterday.

## 2021-04-03 ENCOUNTER — Other Ambulatory Visit: Payer: Self-pay | Admitting: Family

## 2021-04-03 ENCOUNTER — Ambulatory Visit: Payer: 59 | Admitting: Family

## 2021-04-03 VITALS — BP 132/74 | HR 96 | Temp 97.9°F | Resp 18 | Ht 72.0 in | Wt 282.0 lb

## 2021-04-03 DIAGNOSIS — M1A9XX0 Chronic gout, unspecified, without tophus (tophi): Secondary | ICD-10-CM | POA: Diagnosis not present

## 2021-04-03 DIAGNOSIS — I10 Essential (primary) hypertension: Secondary | ICD-10-CM

## 2021-04-03 DIAGNOSIS — M109 Gout, unspecified: Secondary | ICD-10-CM

## 2021-04-03 DIAGNOSIS — Z13220 Encounter for screening for lipoid disorders: Secondary | ICD-10-CM | POA: Diagnosis not present

## 2021-04-03 LAB — LIPID PANEL
Cholesterol: 218 mg/dL — ABNORMAL HIGH (ref 0–200)
HDL: 34.7 mg/dL — ABNORMAL LOW (ref >39.00)
LDL Cholesterol: 144 mg/dL — ABNORMAL HIGH (ref 0–99)
NonHDL: 183.01
Total CHOL/HDL Ratio: 6
Triglycerides: 195 mg/dL — ABNORMAL HIGH (ref 0.0–149.0)
VLDL: 39 mg/dL (ref 0.0–40.0)

## 2021-04-03 LAB — CBC WITH DIFFERENTIAL/PLATELET
Basophils Absolute: 0.1 10*3/uL (ref 0.0–0.1)
Basophils Relative: 1.2 % (ref 0.0–3.0)
Eosinophils Absolute: 0.2 10*3/uL (ref 0.0–0.7)
Eosinophils Relative: 3.7 % (ref 0.0–5.0)
HCT: 46.6 % (ref 39.0–52.0)
Hemoglobin: 15.5 g/dL (ref 13.0–17.0)
Lymphocytes Relative: 26.9 % (ref 12.0–46.0)
Lymphs Abs: 1.6 10*3/uL (ref 0.7–4.0)
MCHC: 33.3 g/dL (ref 30.0–36.0)
MCV: 94 fl (ref 78.0–100.0)
Monocytes Absolute: 0.5 10*3/uL (ref 0.1–1.0)
Monocytes Relative: 7.6 % (ref 3.0–12.0)
Neutro Abs: 3.7 10*3/uL (ref 1.4–7.7)
Neutrophils Relative %: 60.6 % (ref 43.0–77.0)
Platelets: 258 10*3/uL (ref 150.0–400.0)
RBC: 4.95 Mil/uL (ref 4.22–5.81)
RDW: 12.9 % (ref 11.5–15.5)
WBC: 6.1 10*3/uL (ref 4.0–10.5)

## 2021-04-03 LAB — URIC ACID: Uric Acid, Serum: 9.4 mg/dL — ABNORMAL HIGH (ref 4.0–7.8)

## 2021-04-03 LAB — COMPREHENSIVE METABOLIC PANEL
ALT: 43 U/L (ref 0–53)
AST: 23 U/L (ref 0–37)
Albumin: 4.8 g/dL (ref 3.5–5.2)
Alkaline Phosphatase: 89 U/L (ref 39–117)
BUN: 16 mg/dL (ref 6–23)
CO2: 33 mEq/L — ABNORMAL HIGH (ref 19–32)
Calcium: 9.8 mg/dL (ref 8.4–10.5)
Chloride: 101 mEq/L (ref 96–112)
Creatinine, Ser: 0.98 mg/dL (ref 0.40–1.50)
GFR: 94.98 mL/min (ref >60.00)
Glucose, Bld: 91 mg/dL (ref 70–99)
Potassium: 3.9 mEq/L (ref 3.5–5.1)
Sodium: 141 mEq/L (ref 135–145)
Total Bilirubin: 0.5 mg/dL (ref 0.2–1.2)
Total Protein: 7.4 g/dL (ref 6.0–8.3)

## 2021-04-03 MED ORDER — ALLOPURINOL 100 MG PO TABS: 100.0000 mg | ORAL_TABLET | Freq: Every day | ORAL | 0 refills | Status: DC

## 2021-04-03 MED ORDER — VALSARTAN-HYDROCHLOROTHIAZIDE 160-25 MG PO TABS: 1.0000 | ORAL_TABLET | Freq: Every day | ORAL | 3 refills | Status: DC

## 2021-04-03 MED ORDER — INDOMETHACIN 50 MG PO CAPS: ORAL_CAPSULE | ORAL | 1 refills | Status: DC
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
## 2021-04-04 ENCOUNTER — Encounter: Payer: Self-pay | Admitting: Family

## 2021-04-05 ENCOUNTER — Other Ambulatory Visit: Payer: Self-pay

## 2021-04-05 ENCOUNTER — Other Ambulatory Visit: Payer: Self-pay | Admitting: Family

## 2021-04-05 ENCOUNTER — Encounter: Payer: Self-pay | Admitting: Family

## 2021-04-05 ENCOUNTER — Ambulatory Visit (INDEPENDENT_AMBULATORY_CARE_PROVIDER_SITE_OTHER)
Admission: RE | Admit: 2021-04-05 | Discharge: 2021-04-05 | Disposition: A | Discharge status: Home / Self Care | Payer: 59 | Source: Ambulatory Visit | Attending: Family | Admitting: Family

## 2021-04-05 DIAGNOSIS — R0789 Other chest pain: Secondary | ICD-10-CM

## 2021-04-05 DIAGNOSIS — N644 Mastodynia: Secondary | ICD-10-CM

## 2021-04-05 IMAGING — DX DG CHEST 2V
2 series · 2 of 2 positions shown · non-contrast
Comparison: Chest radiograph dated [DATE].

CLINICAL DATA: Chest pain.

EXAM:
CHEST - 2 VIEW

[chest pa]
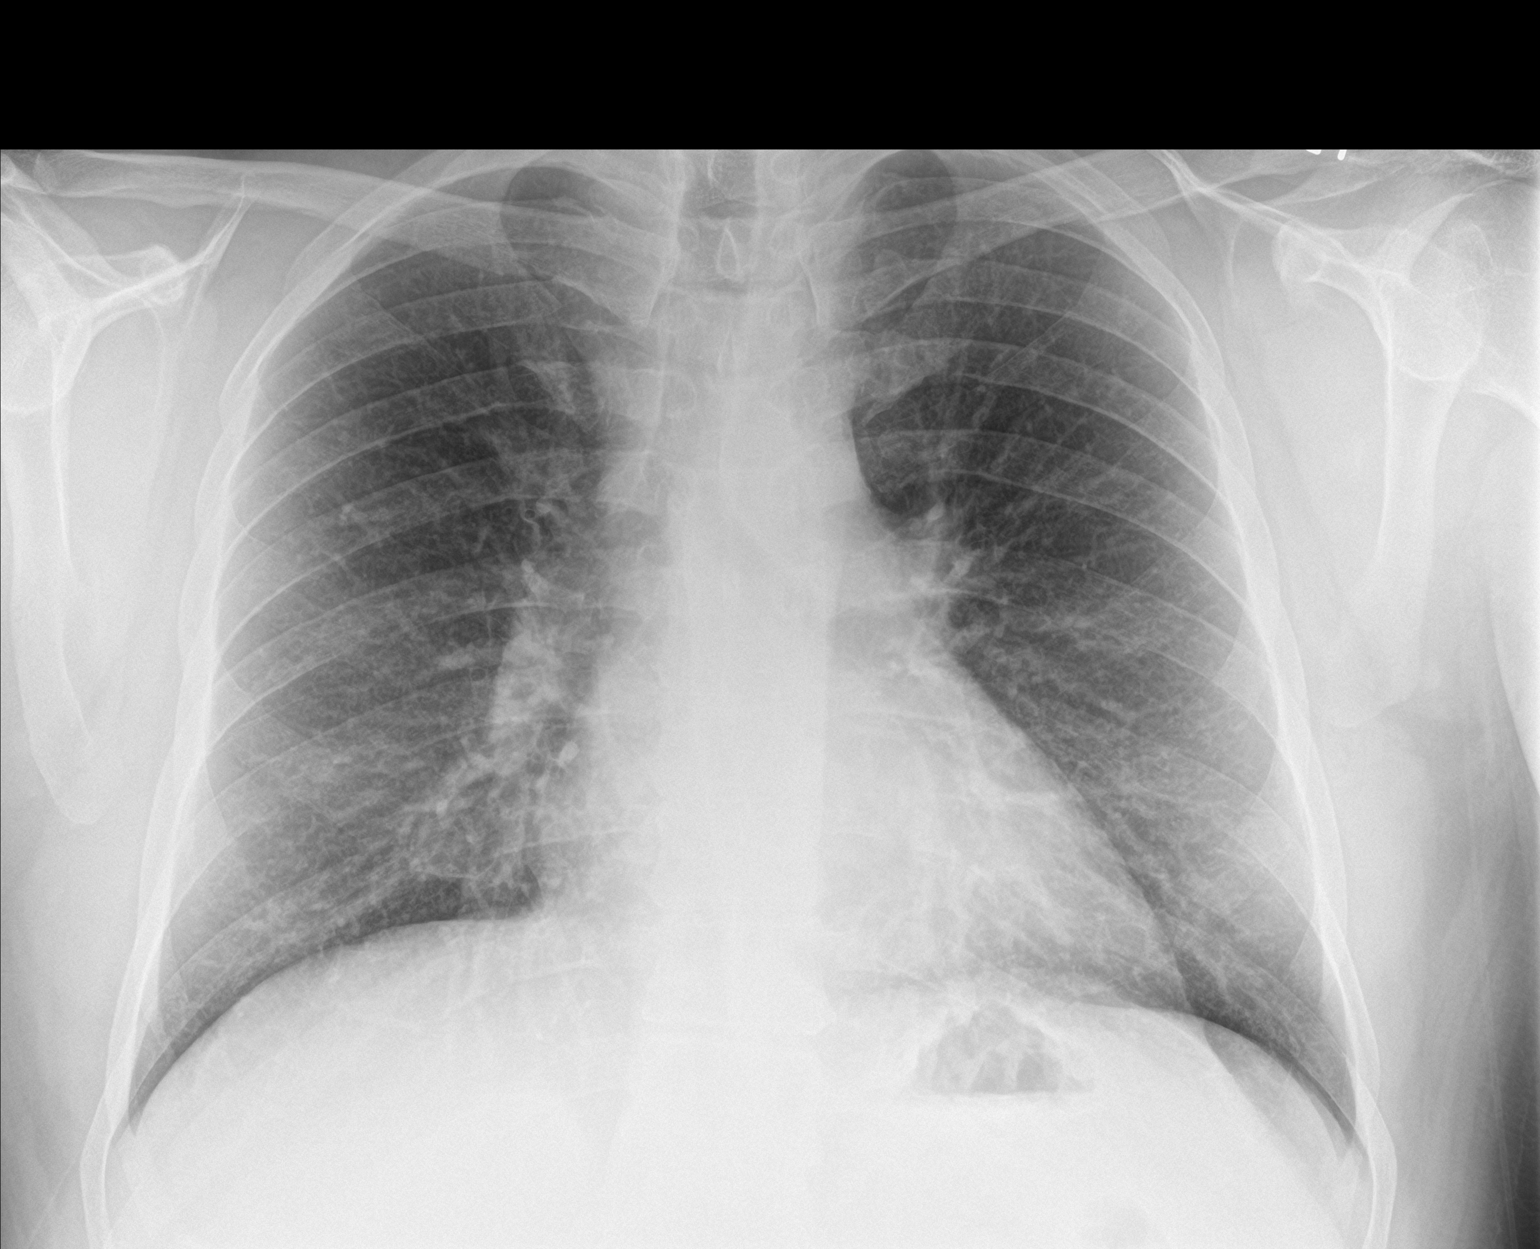

[chest lat]
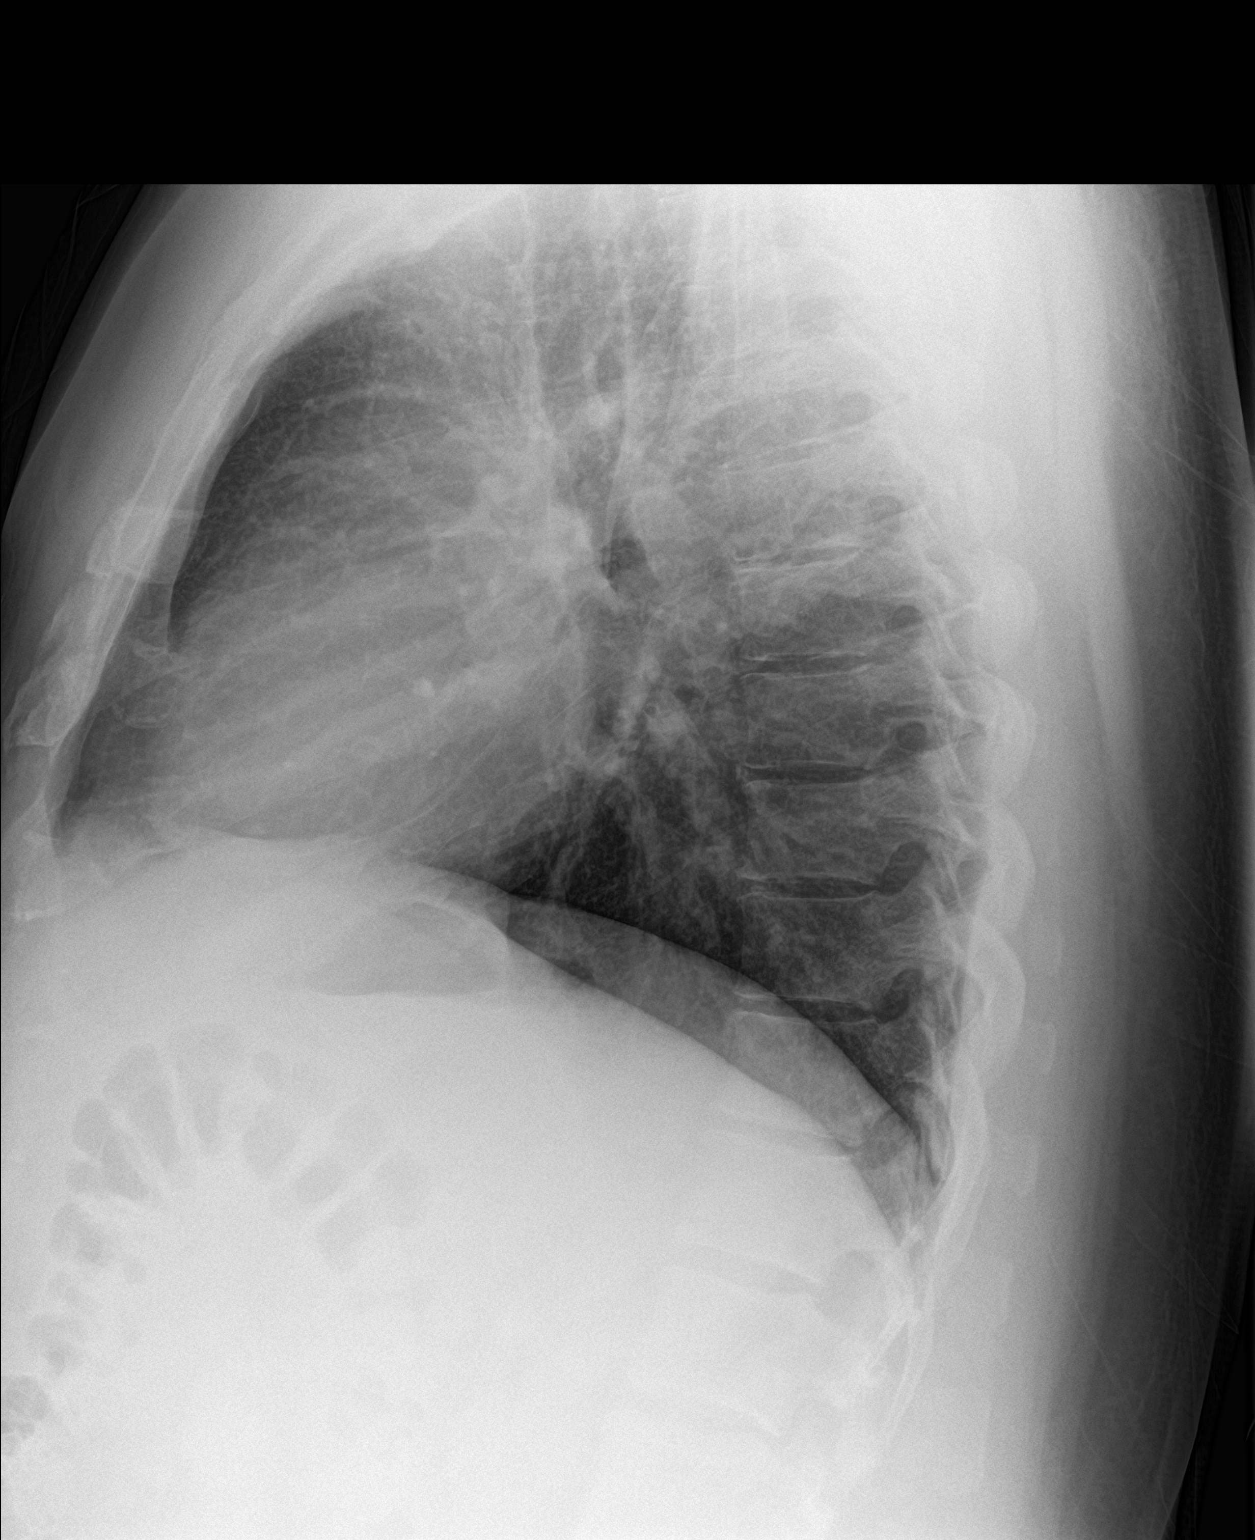

[2 of 2 positions shown; findings below may reference images not displayed]

FINDINGS: No focal consolidation, pleural effusion or pneumothorax. The
cardiac silhouette is within limits. No acute osseous pathology.
Cervical spine ACDF.
IMPRESSION: No active cardiopulmonary disease.

## 2021-04-12 ENCOUNTER — Other Ambulatory Visit: Payer: Self-pay | Admitting: Family

## 2021-04-12 DIAGNOSIS — N644 Mastodynia: Secondary | ICD-10-CM

## 2021-05-01 ENCOUNTER — Ambulatory Visit
Admission: RE | Admit: 2021-05-01 | Discharge: 2021-05-01 | Disposition: A | Discharge status: Home / Self Care | Payer: 59 | Source: Ambulatory Visit | Attending: Family | Admitting: Family

## 2021-05-01 ENCOUNTER — Ambulatory Visit: Admission: RE | Admit: 2021-05-01 | Payer: 59 | Source: Ambulatory Visit

## 2021-05-01 DIAGNOSIS — N644 Mastodynia: Secondary | ICD-10-CM

## 2021-05-01 IMAGING — MG DIGITAL DIAGNOSTIC BILAT W/ TOMO W/ CAD
6 of 10 series · 6 of 30 positions shown · non-contrast
Comparison: None.

CLINICAL DATA: 40-year-old male presenting with left nipple pain
and swelling for approximately 2 months.

EXAM:
DIGITAL DIAGNOSTIC BILATERAL MAMMOGRAM WITH TOMOSYNTHESIS AND CAD
TECHNIQUE: Bilateral digital diagnostic mammography and breast tomosynthesis
was performed. The images were evaluated with computer-aided
detection.

[L TAN synth-2D]
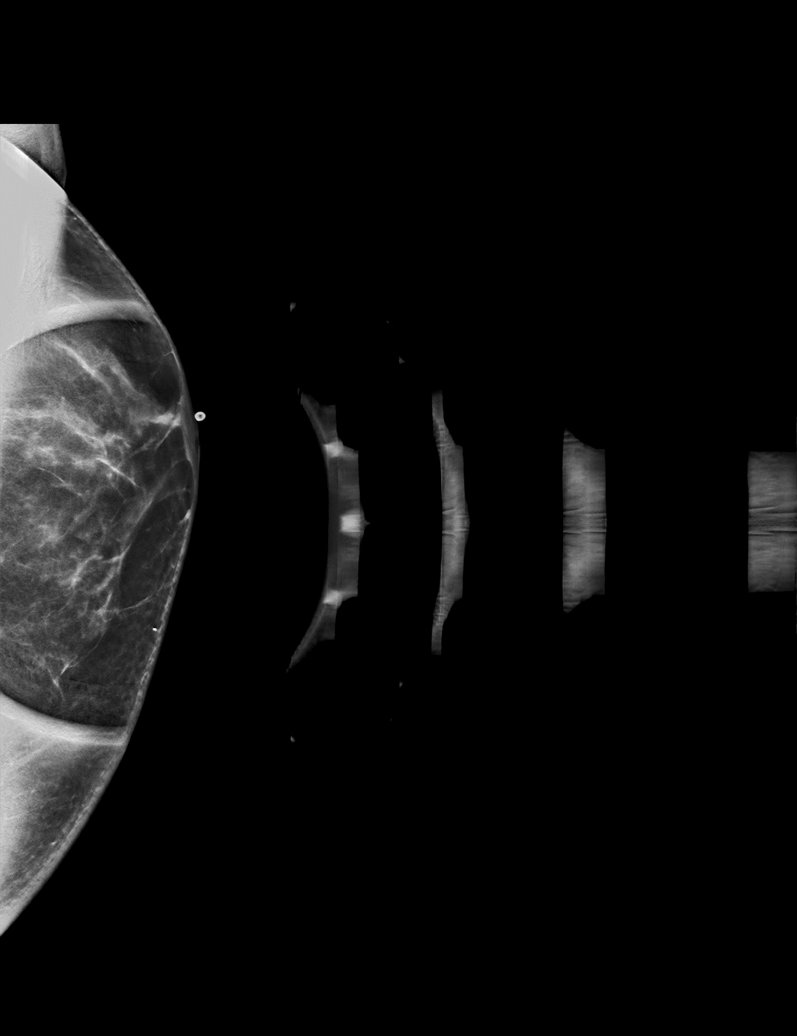

[L MLO synth-2D]
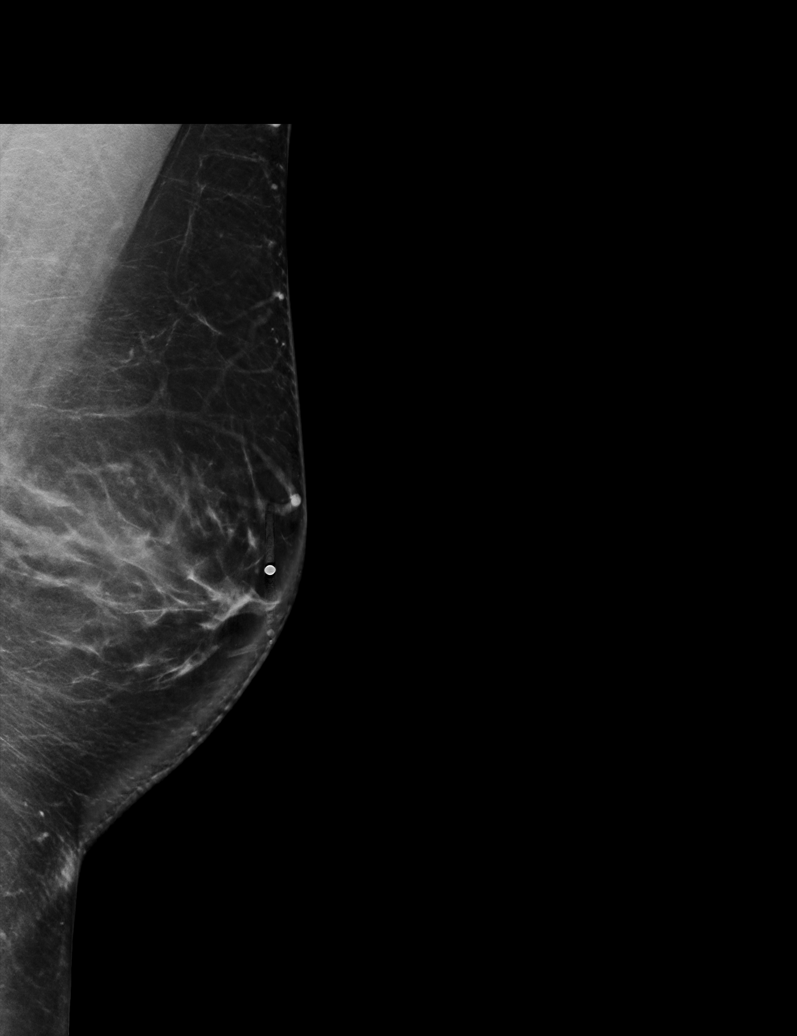

[R CC synth-2D]
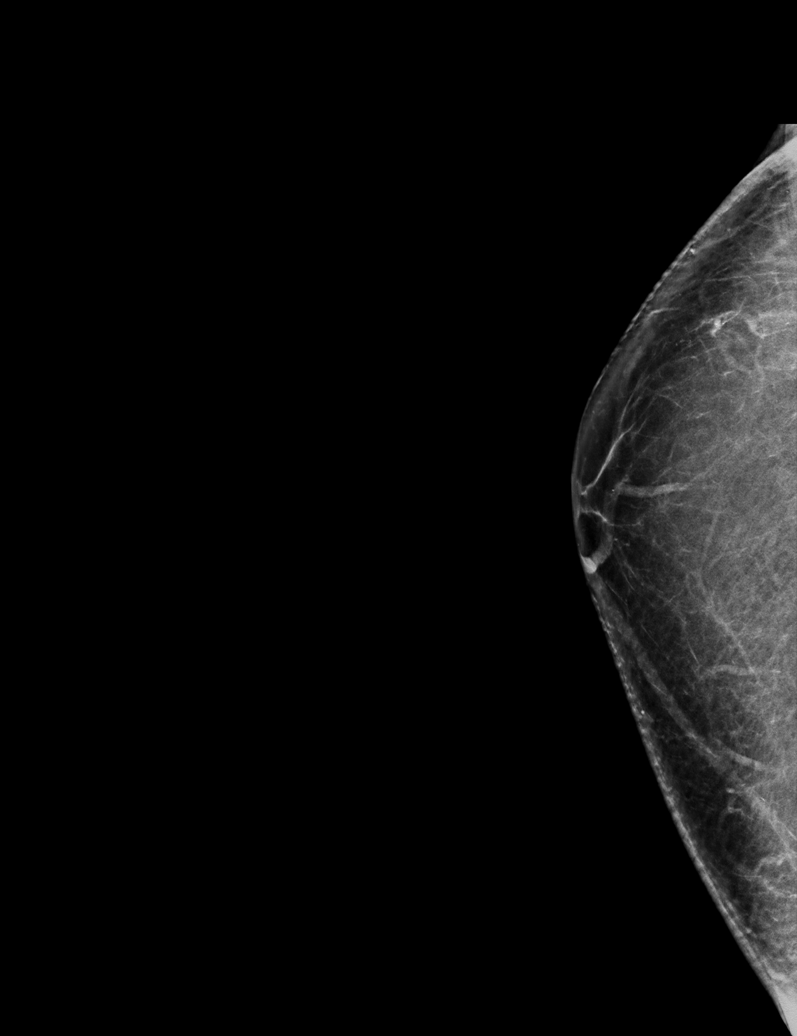

[L CC synth-2D]
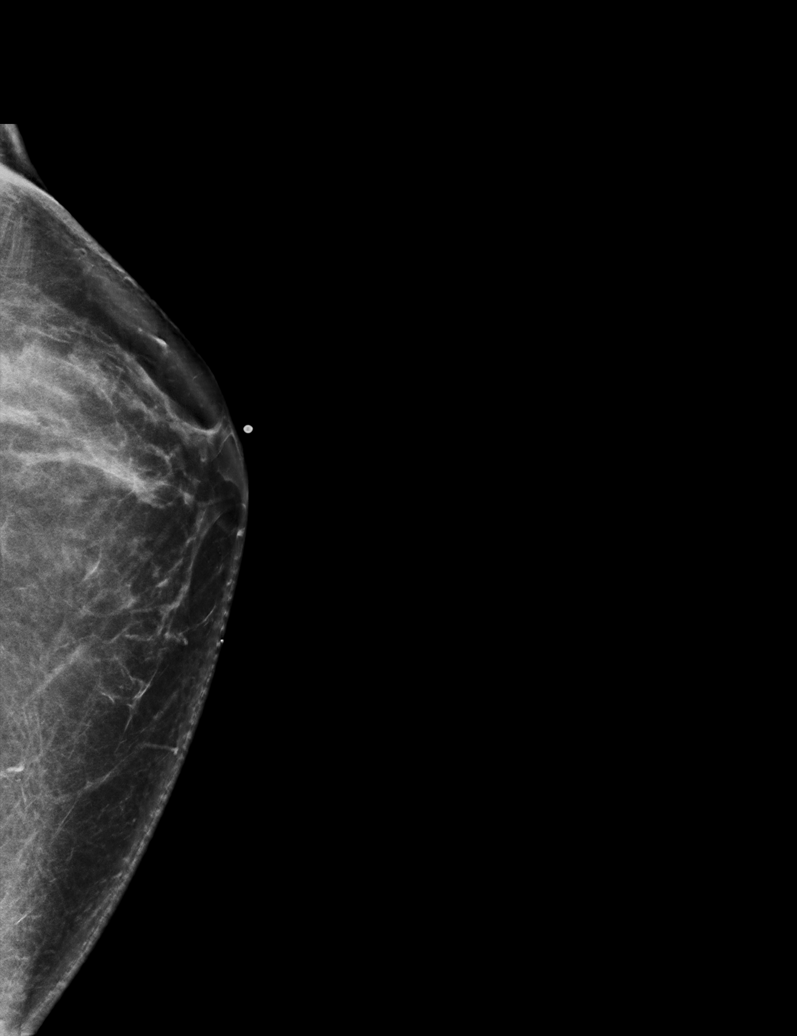

[R MLO synth-2D]
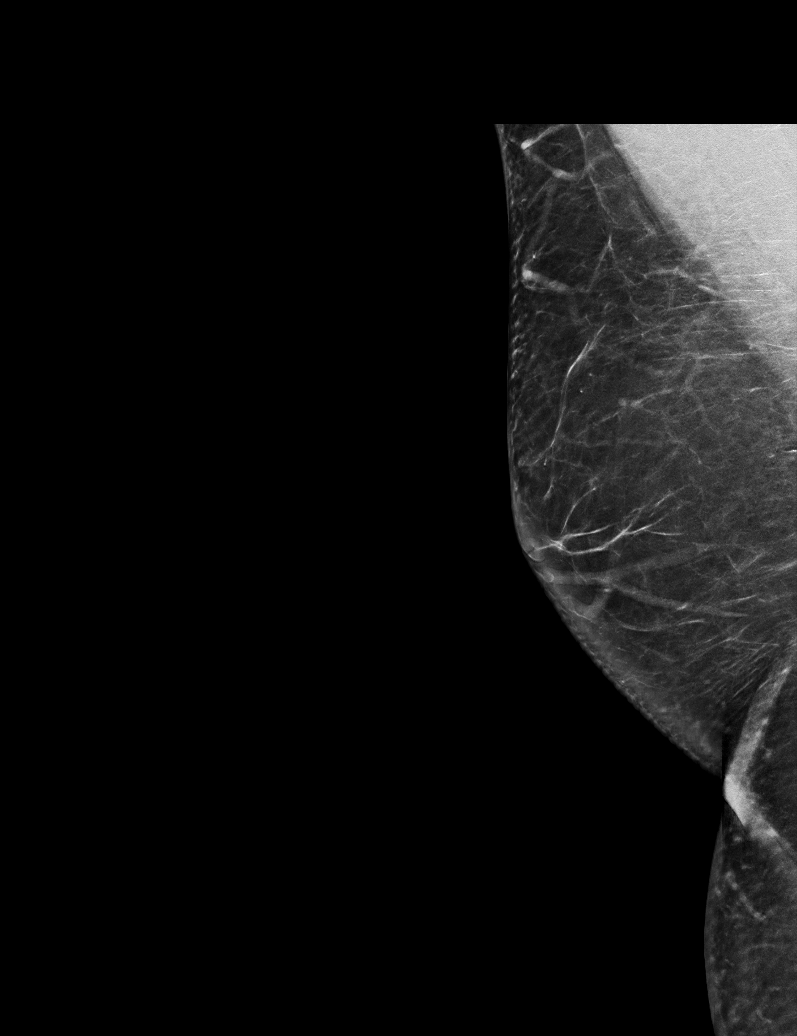

[R MLO tomo · tomo slice 37/72.0]
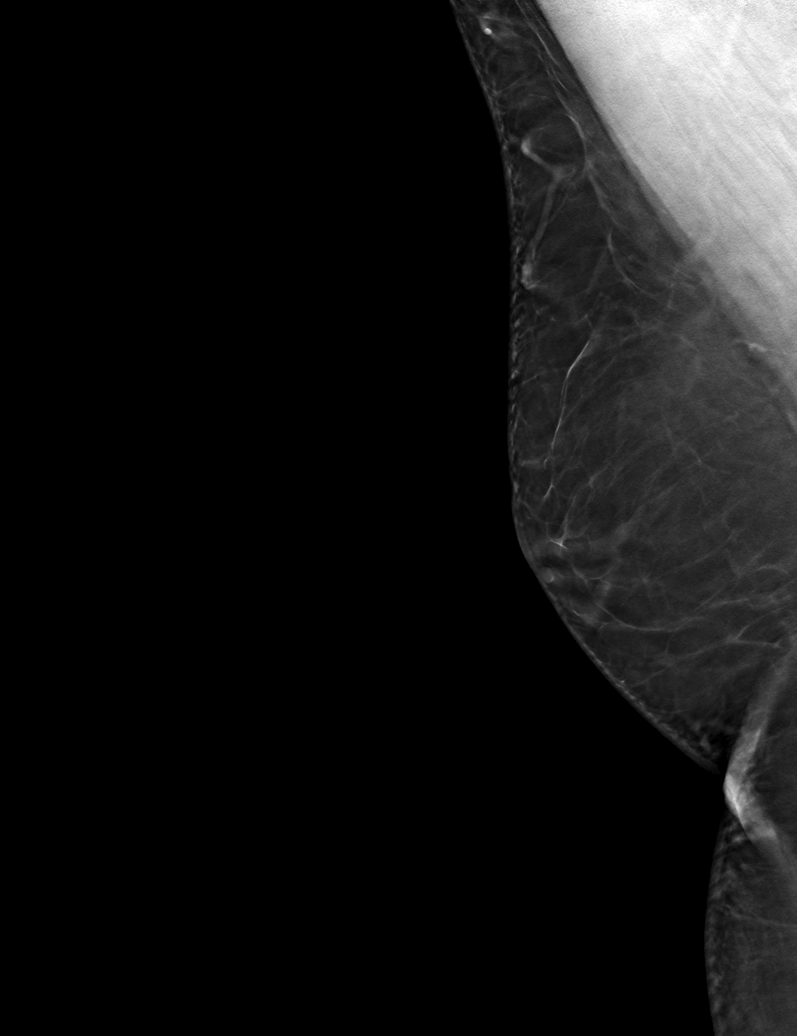

[6 of 30 positions shown; findings below may reference images not displayed]

ACR Breast Density Category b: There are scattered areas of
fibroglandular density.
FINDINGS: Right breast: No suspicious mass, distortion, or microcalcifications
are identified to suggest presence of malignancy.

Left breast: There is a moderate amount of fibroglandular tissue in
the retroareolar left breast at the site of concern. No suspicious
mass, distortion, or microcalcifications are identified to suggest
presence of malignancy.

On physical exam of the left breast I feel a soft fullness behind
the nipple without a fixed discrete mass. There are no obvious skin
changes.
IMPRESSION: Asymmetric left breast gynecomastia.

RECOMMENDATION:
Clinical follow-up.

I discussed with the patient the fact that gynecomastia can occur in
men as testosterone levels decrease with age or in younger men with
low testosterone levels, causing a change in the serum
testosterone:estrogen ratio. We also discussed other potential
etiologies of gynecomastia including numerous prescription
medications and recreational drugs (marijuana and anabolic steroids
in particular). Approximately 20% of cases of gynecomastia are
idiopathic.

I counseled the patient to perform self-examination to make sure
that a hard lump does not develop which could indicate malignancy
and would need further evaluation.

BI-RADS CATEGORY  2: Benign.

## 2021-05-07 ENCOUNTER — Other Ambulatory Visit: Payer: Self-pay | Admitting: Family

## 2021-05-07 ENCOUNTER — Encounter: Payer: Self-pay | Admitting: Family

## 2021-05-07 DIAGNOSIS — N62 Hypertrophy of breast: Secondary | ICD-10-CM

## 2021-05-15 ENCOUNTER — Other Ambulatory Visit (INDEPENDENT_AMBULATORY_CARE_PROVIDER_SITE_OTHER): Payer: 59

## 2021-05-15 DIAGNOSIS — M109 Gout, unspecified: Secondary | ICD-10-CM

## 2021-05-15 DIAGNOSIS — N62 Hypertrophy of breast: Secondary | ICD-10-CM | POA: Diagnosis not present

## 2021-05-15 LAB — COMPREHENSIVE METABOLIC PANEL
ALT: 36 U/L (ref 0–53)
AST: 20 U/L (ref 0–37)
Albumin: 4.8 g/dL (ref 3.5–5.2)
Alkaline Phosphatase: 86 U/L (ref 39–117)
BUN: 20 mg/dL (ref 6–23)
CO2: 28 mEq/L (ref 19–32)
Calcium: 9.7 mg/dL (ref 8.4–10.5)
Chloride: 99 mEq/L (ref 96–112)
Creatinine, Ser: 1.02 mg/dL (ref 0.40–1.50)
GFR: 90.46 mL/min (ref >60.00)
Glucose, Bld: 167 mg/dL — ABNORMAL HIGH (ref 70–99)
Potassium: 3.6 mEq/L (ref 3.5–5.1)
Sodium: 138 mEq/L (ref 135–145)
Total Bilirubin: 0.4 mg/dL (ref 0.2–1.2)
Total Protein: 7.2 g/dL (ref 6.0–8.3)

## 2021-05-15 LAB — URIC ACID: Uric Acid, Serum: 8.1 mg/dL — ABNORMAL HIGH (ref 4.0–7.8)

## 2021-05-16 ENCOUNTER — Telehealth: Payer: Self-pay | Admitting: *Deleted

## 2021-05-16 ENCOUNTER — Other Ambulatory Visit: Payer: Self-pay | Admitting: Family

## 2021-05-16 DIAGNOSIS — R7989 Other specified abnormal findings of blood chemistry: Secondary | ICD-10-CM

## 2021-05-16 DIAGNOSIS — R7309 Other abnormal glucose: Secondary | ICD-10-CM

## 2021-05-16 LAB — TESTOSTERONE TOTAL,FREE,BIO, MALES
Albumin: 4.5 g/dL (ref 3.6–5.1)
Sex Hormone Binding: 9 nmol/L — ABNORMAL LOW (ref 10–50)
Testosterone: 77 ng/dL — ABNORMAL LOW (ref 250–827)

## 2021-05-16 MED ORDER — ALLOPURINOL 300 MG PO TABS: 300.0000 mg | ORAL_TABLET | Freq: Every day | ORAL | 1 refills | Status: DC

## 2021-05-16 NOTE — Telephone Encounter (Signed)
-----   Message from Marrian Salvage, [place] sent at 05/16/2021  7:47 AM EDT ----- ?Is there any way to add Hgba1c to his labs that were done yesterday? I will put order in place just in case. Can you let me know what the lab says? ? ?Please let the patient know that his testosterone is very low. I am going to refer him to urology to discuss treatment. ?Also uric acid is better but still above goal- will increase Allopurinol to 300 mg daily; prescription updated; plan to follow up in about 4 months.  ? ? ?

## 2021-05-16 NOTE — Telephone Encounter (Signed)
Patient notified of lab results and ov scheduled for 2 months. ?

## 2021-05-16 NOTE — Telephone Encounter (Signed)
We would be unable to add.  Would you like when I call him for him to come back in or wait a certain amount time? ?

## 2021-06-05 ENCOUNTER — Other Ambulatory Visit: Payer: Self-pay | Admitting: Family

## 2021-06-24 ENCOUNTER — Encounter: Payer: Self-pay | Admitting: Family

## 2021-06-28 ENCOUNTER — Other Ambulatory Visit: Payer: Self-pay | Admitting: Family

## 2021-06-28 ENCOUNTER — Encounter: Payer: Self-pay | Admitting: Family

## 2021-06-28 DIAGNOSIS — G4730 Sleep apnea, unspecified: Secondary | ICD-10-CM

## 2021-07-06 ENCOUNTER — Other Ambulatory Visit: Payer: Self-pay | Admitting: Urology

## 2021-07-06 DIAGNOSIS — E291 Testicular hypofunction: Secondary | ICD-10-CM

## 2021-07-10 ENCOUNTER — Ambulatory Visit (INDEPENDENT_AMBULATORY_CARE_PROVIDER_SITE_OTHER): Payer: 59 | Admitting: Primary Care

## 2021-07-10 ENCOUNTER — Encounter: Payer: Self-pay | Admitting: Primary Care

## 2021-07-10 DIAGNOSIS — G4733 Obstructive sleep apnea (adult) (pediatric): Secondary | ICD-10-CM

## 2021-07-10 NOTE — Assessment & Plan Note (Signed)
-   Patient has a history of severe obstructive sleep apnea, sleep study in April 2017 showed AHI 77/h.  Optimal PAP pressure was 13 cm H2O.  CPAP was started in June 2017.  Settings were ultimately adjusted and patient is currently on AutoSetting 5 to 15 cm H2O.  He reports 100% compliance with CPAP use last [employer] download was from March 2019. He received message stating that his CPAP motor had reached life expectancy. Current CPAP machine is still in working order but greater than 68 years old.  We have contacted his [place] and they will send [place] an updated compliance report.  We have placed an order for patient to receive new CPAP machine at current pressure settings 5 to 15 cm H2O.  Encourage weight loss efforts and side sleeping or elevated head position at night.  Advised against driving experiencing excessive daytime sleepiness or fatigue.  Follow-up in 1 year or sooner if needed.

## 2021-07-10 NOTE — Addendum Note (Signed)
Addended by: Gavin Potters R on: 07/10/2021 10:43 AM   Modules accepted: Orders

## 2021-07-10 NOTE — Patient Instructions (Addendum)
Recommendations: Continue CPAP every night 4-6 hours or longer Continue to work on weight loss efforts Focus on side sleeping position or elevate head 30 degrees at night Do not drive if experiencing excessive daytime sleepiness or fatigue  Orders New CPAP machine auto titrate 5 to 15 cm H2O-priority due to severe obstructive sleep apnea  Follow-up 1 year with Dr. Elsworth Soho OR contact us sooner if needed   CPAP and BIPAP Information CPAP and BIPAP are methods that use air pressure to keep your airways open and to help you breathe well. CPAP and BIPAP use different amounts of pressure. Your health care provider will tell you whether CPAP or BIPAP would be more helpful for you. CPAP stands for "continuous positive airway pressure." With CPAP, the amount of pressure stays the same while you breathe in (inhale) and out (exhale). BIPAP stands for "bi-level positive airway pressure." With BIPAP, the amount of pressure will be higher when you inhale and lower when you exhale. This allows you to take larger breaths. CPAP or BIPAP may be used in the hospital, or your health care provider may want you to use it at home. You may need to have a sleep study before your health care provider can order a machine for you to use at home. What are the advantages? CPAP or BIPAP can be helpful if you have: Sleep apnea. Chronic obstructive pulmonary disease (COPD). Heart failure. Medical conditions that cause muscle weakness, including muscular dystrophy or amyotrophic lateral sclerosis (ALS). Other problems that cause breathing to be shallow, weak, abnormal, or difficult. CPAP and BIPAP are most commonly used for obstructive sleep apnea (OSA) to keep the airways from collapsing when the muscles relax during sleep. What are the risks? Generally, this is a safe treatment. However, problems may occur, including: Irritated skin or skin sores if the mask does not fit properly. Dry or stuffy nose or nosebleeds. Dry  mouth. Feeling gassy or bloated. Sinus or lung infection if the equipment is not cleaned properly. When should CPAP or BIPAP be used? In most cases, the mask only needs to be worn during sleep. Generally, the mask needs to be worn throughout the night and during any daytime naps. People with certain medical conditions may also need to wear the mask at other times, such as when they are awake. Follow instructions from your health care provider about when to use the machine. What happens during CPAP or BIPAP?  Both CPAP and BIPAP are provided by a small machine with a flexible plastic tube that attaches to a plastic mask that you wear. Air is blown through the mask into your nose or mouth. The amount of pressure that is used to blow the air can be adjusted on the machine. Your health care provider will set the pressure setting and help you find the best mask for you. Tips for using the mask Because the mask needs to be snug, some people feel trapped or closed-in (claustrophobic) when first using the mask. If you feel this way, you may need to get used to the mask. One way to do this is to hold the mask loosely over your nose or mouth and then gradually apply the mask more snugly. You can also gradually increase the amount of time that you use the mask. Masks are available in various types and sizes. If your mask does not fit well, talk with your health care provider about getting a different one. Some common types of masks include: Full face masks, which  fit over the mouth and nose. Nasal masks, which fit over the nose. Nasal pillow or prong masks, which fit into the nostrils. If you are using a mask that fits over your nose and you tend to breathe through your mouth, a chin strap may be applied to help keep your mouth closed. Use a skin barrier to protect your skin as told by your health care provider. Some CPAP and BIPAP machines have alarms that may sound if the mask comes off or develops a  leak. If you have trouble with the mask, it is very important that you talk with your health care provider about finding a way to make the mask easier to tolerate. Do not stop using the mask. There could be a negative impact on your health if you stop using the mask. Tips for using the machine Place your CPAP or BIPAP machine on a secure table or stand near an electrical outlet. Know where the on/off switch is on the machine. Follow instructions from your health care provider about how to set the pressure on your machine and when you should use it. Do not eat or drink while the CPAP or BIPAP machine is on. Food or fluids could get pushed into your lungs by the pressure of the CPAP or BIPAP. For home use, CPAP and BIPAP machines can be rented or purchased through home health care companies. Many different brands of machines are available. Renting a machine before purchasing may help you find out which particular machine works well for you. Your health insurance company may also decide which machine you may get. Keep the CPAP or BIPAP machine and attachments clean. Ask your health care provider for specific instructions. Check the humidifier if you have a dry stuffy nose or nosebleeds. Make sure it is working correctly. Follow these instructions at home: Take over-the-counter and prescription medicines only as told by your health care provider. Ask if you can take sinus medicine if your sinuses are blocked. Do not use any products that contain nicotine or tobacco. These products include cigarettes, chewing tobacco, and vaping devices, such as e-cigarettes. If you need help quitting, ask your health care provider. Keep all follow-up visits. This is important. Contact a health care provider if: You have redness or pressure sores on your head, face, mouth, or nose from the mask or head gear. You have trouble using the CPAP or BIPAP machine. You cannot tolerate wearing the CPAP or BIPAP mask. Someone  tells you that you snore even when wearing your CPAP or BIPAP. Get help right away if: You have trouble breathing. You feel confused. Summary CPAP and BIPAP are methods that use air pressure to keep your airways open and to help you breathe well. If you have trouble with the mask, it is very important that you talk with your health care provider about finding a way to make the mask easier to tolerate. Do not stop using the mask. There could be a negative impact to your health if you stop using the mask. Follow instructions from your health care provider about when to use the machine. This information is not intended to replace advice given to you by your health care provider. Make sure you discuss any questions you have with your health care provider. Document Revised: 09/06/2020 Document Reviewed: 01/07/2020 Elsevier Patient Education  [place].

## 2021-07-10 NOTE — Progress Notes (Signed)
$'@Patient'c$  ID: Wayne Wise, male    DOB: Jul 13, 1980, 41 y.o.   MRN: [id number]  Chief Complaint  Patient presents with   Consult    Currently using C-Pap     Referring provider: Marrian Salvage,*  HPI: 41 year old male, never smoked.  Past medical history significant for hypertension, OSA, traumatic brain injury, left knee arthroscopy, carpal tunnel syndrome, recurrent urticaria, headache syndrome.  07/10/2021 Patient presents today for sleep consult.  Former patient of Dr. Elsworth Soho, last seen in office on 10/02/2015 for OSA.  Sleep study in April 2017 showed severe obstructive sleep apnea, AHI 77/hr. patient underwent Pap titration which showed optimal pressure of 13 cm H2O. Last download from July 2017 showed patient was on auto setting 5-15cm h20. He continues to wear CPAP. He received message on his machine stating motor at exceeded lifetime. Machines is still in working condition but 43 years old. He is compliant with CPAP every night, wearing on average 9 hours a night.   He can not sleep without using his CPAP. Without CPAP he does notice he stops breathing. Typical bedtime is 9 PM.  It takes him on average 20 minutes to fall asleep.  He wakes up 1-2 times a night.  He starts his day at 6:30 AM.  DME was [facility] in 2017. He gets supplies through [employer].  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   Airview download 05/02/2017-05/31/2017 Usage 30/30 days (100%); 30 days (100%) greater than 4 hours Average usage 9 hours 36 minutes Pressure 5 to 15 cm H2O (10.3 cm H2O- 95%) Air leaks 3.8 L/min (95%) AHI 0.4   Allergies  Allergen Reactions   Other Hives and Anaphylaxis    Beef,pork,bison,lamb,venison Mammal - Red Meat   Balsam    Bismuth Subnitrate    Castor Oil    Hydrocodone     Dizzy    Lanolin    Poison Ivy Extract    Sulfa Antibiotics Swelling and Other (See Comments)    Swelling of fingers    Immunization History  Administered Date(s) Administered   Influenza,inj,Quad  PF,6+ Mos 12/29/2019, 12/17/2020   Influenza,inj,quad, With Preservative 11/06/2013, 10/12/2017   Influenza-Unspecified 01/01/2015   PFIZER(Purple Top)SARS-COV-2 Vaccination 09/21/2019, 10/17/2019   Tdap 04/27/2003, 08/19/2013    Past Medical History:  Diagnosis Date   Arthritis    Carpal tunnel syndrome    Bilateral   Chicken pox as child   Gout    Headache syndrome 07/01/2017   Headaches due to old head trauma    History of kidney stones    Hypertension    Migraines    Sleep apnea    cpap autoset starts at 3   TBI (traumatic brain injury) ([place]) 2011    Tobacco History: Social History   Tobacco Use  Smoking Status Never  Smokeless Tobacco Former   Types: Chew   Quit date: 2012   Counseling given: Not Answered   Outpatient Medications Prior to Visit  Medication Sig Dispense Refill   allopurinol (ZYLOPRIM) 300 MG tablet Take 1 tablet (300 mg total) by mouth daily. 90 tablet 1   EPINEPHrine (AUVI-Q) 0.3 mg/0.3 mL IJ SOAJ injection Use as directed for severe allergic reactions 2 each 1   EPINEPHrine 0.3 mg/0.3 mL IJ SOAJ injection epinephrine 0.3 mg/0.3 mL injection, auto-injector     famotidine (PEPCID) 20 MG tablet 1 tablet 1-2 times a day if needed for itch or hives 180 tablet 3   indomethacin (INDOCIN) 50 MG capsule Use tid  prn for gout flare as directed 60 capsule 1   levocetirizine (XYZAL) 5 MG tablet Take 1 tablet once or twice a day as needed for itch and hives 90 tablet 1   montelukast (SINGULAIR) 10 MG tablet Take 1 tablet as needed for itch or hives 90 tablet 3   UNABLE TO FIND Med Name: Mega D3 & MK-7 (Bone and Cardiovascular Support)     valsartan-hydrochlorothiazide (DIOVAN-HCT) 160-25 MG tablet TAKE ONE TABLET BY MOUTH ONE TIME DAILY 90 tablet 0   fluticasone (FLONASE) 50 MCG/ACT nasal spray Place 2 sprays into both nostrils daily. (Patient not taking: Reported on 07/10/2021) 16 g 2   No facility-administered medications prior to visit.   Review of  Systems  Review of Systems  Constitutional: Negative.  Negative for fatigue.  HENT: Negative.    Respiratory: Negative.    Psychiatric/Behavioral:  Negative for sleep disturbance.     Physical Exam  BP 124/76 (BP Location: Left Arm, Patient Position: Sitting, Cuff Size: Normal)   Pulse 88   Temp 98 F (36.7 C) (Oral)   Ht 6' (1.829 m)   Wt 278 lb (126.1 kg)   SpO2 95%   BMI 37.70 kg/m  Physical Exam Constitutional:      General: He is not in acute distress.    Appearance: Normal appearance. He is obese. He is not ill-appearing.  HENT:     Head: Normocephalic and atraumatic.     Mouth/Throat:     Mouth: Mucous membranes are moist.     Pharynx: Oropharynx is clear.     Comments: Mallampati class III Cardiovascular:     Rate and Rhythm: Normal rate and regular rhythm.  Pulmonary:     Effort: Pulmonary effort is normal.     Breath sounds: Normal breath sounds.  Musculoskeletal:        General: Normal range of motion.  Skin:    General: Skin is warm and dry.  Neurological:     General: No focal deficit present.     Mental Status: He is alert and oriented to person, place, and time. Mental status is at baseline.  Psychiatric:        Mood and Affect: Mood normal.        Behavior: Behavior normal.        Thought Content: Thought content normal.        Judgment: Judgment normal.     Lab Results:  CBC    Component Value Date/Time   WBC 6.1 04/03/2021 0926   RBC 4.95 04/03/2021 0926   HGB 15.5 04/03/2021 0926   HGB 15.2 03/26/2018 1511   HCT 46.6 04/03/2021 0926   HCT 44.8 03/26/2018 1511   PLT 258.0 04/03/2021 0926   PLT 254 03/26/2018 1511   MCV 94.0 04/03/2021 0926   MCV 93 03/26/2018 1511   MCH 31.5 03/26/2018 1511   MCH 31.6 01/13/2018 1254   MCHC 33.3 04/03/2021 0926   RDW 12.9 04/03/2021 0926   RDW 12.6 03/26/2018 1511   LYMPHSABS 1.6 04/03/2021 0926   MONOABS 0.5 04/03/2021 0926   EOSABS 0.2 04/03/2021 0926   BASOSABS 0.1 04/03/2021 0926     BMET    Component Value Date/Time   NA 138 05/15/2021 0847   NA 140 03/26/2018 1511   K 3.6 05/15/2021 0847   CL 99 05/15/2021 0847   CO2 28 05/15/2021 0847   GLUCOSE 167 (H) 05/15/2021 0847   BUN 20 05/15/2021 0847   BUN 19 03/26/2018 1511  CREATININE 1.02 05/15/2021 0847   CREATININE 0.89 04/13/2020 0954   CALCIUM 9.7 05/15/2021 0847   GFRNONAA 107 03/26/2018 1511   GFRAA 124 03/26/2018 1511    BNP No results found for: BNP  ProBNP No results found for: PROBNP  Imaging: No results found.   Assessment & Plan:   OSA (obstructive sleep apnea) - Patient has a history of severe obstructive sleep apnea, sleep study in April 2017 showed AHI 77/h.  Optimal PAP pressure was 13 cm H2O.  CPAP was started in June 2017.  Settings were ultimately adjusted and patient is currently on AutoSetting 5 to 15 cm H2O.  He reports 100% compliance with CPAP use last Airview download was from March 2019. He received message stating that his CPAP motor had reached life expectancy. Current CPAP machine is still in working order but greater than 54 years old.  We have contacted his [place] and they will send [place] an updated compliance report.  We have placed an order for patient to receive new CPAP machine at current pressure settings 5 to 15 cm H2O.  Encourage weight loss efforts and side sleeping or elevated head position at night.  Advised against driving experiencing excessive daytime sleepiness or fatigue.  Follow-up in 1 year or sooner if needed.   Martyn Ehrich, NP 07/10/2021

## 2021-07-12 ENCOUNTER — Telehealth: Payer: Self-pay | Admitting: Primary Care

## 2021-07-12 NOTE — Telephone Encounter (Signed)
Called and spoke to pt. Informed him the CPAP order was placed the day of his appt with Beth (5/30). Advised pt to allow 7-10 business days for the DME to reach out. Gave pt the phone number to [facility] to contact them as needed. Pt verbalized understanding and denied any further questions or concerns at this time.

## 2021-07-13 ENCOUNTER — Ambulatory Visit: Payer: 59 | Admitting: Family

## 2021-07-13 ENCOUNTER — Encounter: Payer: Self-pay | Admitting: Family

## 2021-07-13 VITALS — BP 131/83 | HR 111 | Temp 97.9°F | Ht 72.0 in | Wt 278.4 lb

## 2021-07-13 DIAGNOSIS — J019 Acute sinusitis, unspecified: Secondary | ICD-10-CM | POA: Diagnosis not present

## 2021-07-13 DIAGNOSIS — R7309 Other abnormal glucose: Secondary | ICD-10-CM | POA: Diagnosis not present

## 2021-07-13 DIAGNOSIS — M109 Gout, unspecified: Secondary | ICD-10-CM

## 2021-07-13 MED ORDER — AMOXICILLIN-POT CLAVULANATE 875-125 MG PO TABS: 1.0000 | ORAL_TABLET | Freq: Two times a day (BID) | ORAL | 0 refills | Status: AC

## 2021-07-13 MED ORDER — OFLOXACIN 0.3 % OP SOLN: 1.0000 [drp] | Freq: Four times a day (QID) | OPHTHALMIC | 0 refills | Status: DC

## 2021-07-13 MED ORDER — FLUTICASONE PROPIONATE 50 MCG/ACT NA SUSP: 2.0000 | Freq: Every day | NASAL | 6 refills | Status: DC
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
## 2021-07-14 LAB — HEMOGLOBIN A1C
Hgb A1c MFr Bld: 5.4 % of total Hgb (ref <5.7)
Mean Plasma Glucose: 108 mg/dL
eAG (mmol/L): 6 mmol/L

## 2021-07-14 LAB — URIC ACID: Uric Acid, Serum: 6.1 mg/dL (ref 4.0–8.0)

## 2021-07-16 ENCOUNTER — Other Ambulatory Visit: Payer: Self-pay | Admitting: Family

## 2021-07-16 DIAGNOSIS — M1A9XX0 Chronic gout, unspecified, without tophus (tophi): Secondary | ICD-10-CM

## 2021-07-16 DIAGNOSIS — M2550 Pain in unspecified joint: Secondary | ICD-10-CM

## 2021-07-17 ENCOUNTER — Ambulatory Visit: Payer: 59 | Admitting: Family

## 2021-07-24 ENCOUNTER — Telehealth: Payer: Self-pay | Admitting: Primary Care

## 2021-07-24 NOTE — Telephone Encounter (Signed)
Pt states [facility] still does not have CPAP order. [facility] states they are waiting to hear from [place] in regards to settings. Pt is going out of town on 6/21 and would like to have it prior to trip as he is going for a month. Routing urgent since pt is leaving town soon. Please advise.
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
## 2021-07-24 NOTE — Telephone Encounter (Signed)
Order was placed 07/10/21 for pt's CPAP.  Tried to call [employer] but could never get anyone on the phone. Routing to [facility] for assistance with this. Please advise.

## 2021-07-25 ENCOUNTER — Ambulatory Visit
Admission: RE | Admit: 2021-07-25 | Discharge: 2021-07-25 | Disposition: A | Discharge status: Home / Self Care | Payer: 59 | Source: Ambulatory Visit | Attending: Urology | Admitting: Urology

## 2021-07-25 ENCOUNTER — Ambulatory Visit: Payer: 59 | Admitting: Internal Medicine

## 2021-07-25 DIAGNOSIS — E291 Testicular hypofunction: Secondary | ICD-10-CM

## 2021-07-25 IMAGING — MR MR HEAD WO/W CM
12 of 19 series · 35 of 48 positions shown · IV contrast (multihance)
Comparison: None Available.

CLINICAL DATA: Primary hypogonadism in male; technologist note
states low testosterone

EXAM:
MRI HEAD WITHOUT AND WITH CONTRAST
TECHNIQUE: Multiplanar, multiecho pulse sequences of the brain and surrounding
structures were obtained without and with intravenous contrast.
CONTRAST:  10mL MULTIHANCE GADOBENATE DIMEGLUMINE 529 MG/ML IV SOLN

[Series 2: t1_se_sag · sagittal · 5.0mm · 0.45mm/px · 3 of 19 slices shown]
[im 1/19]
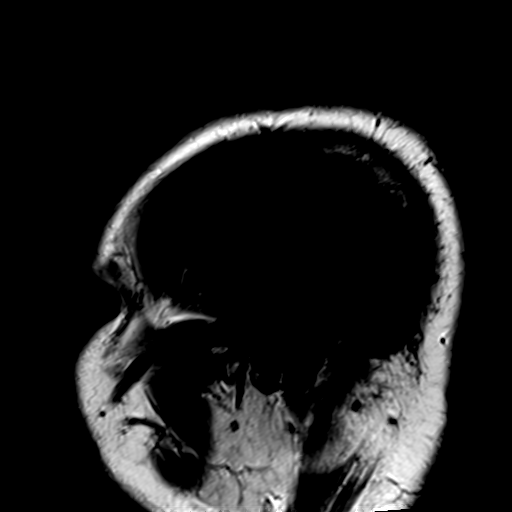
[im 10/19]
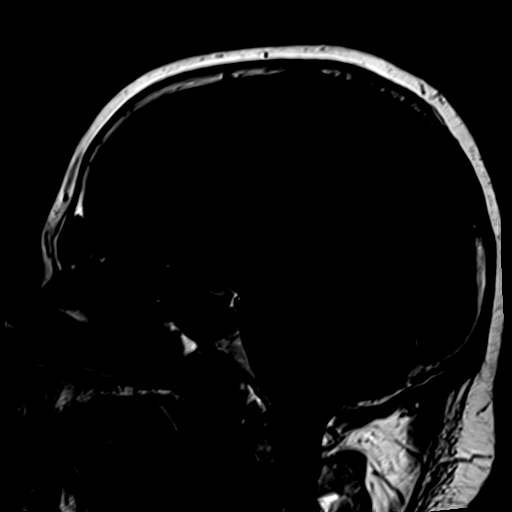
[im 19/19]
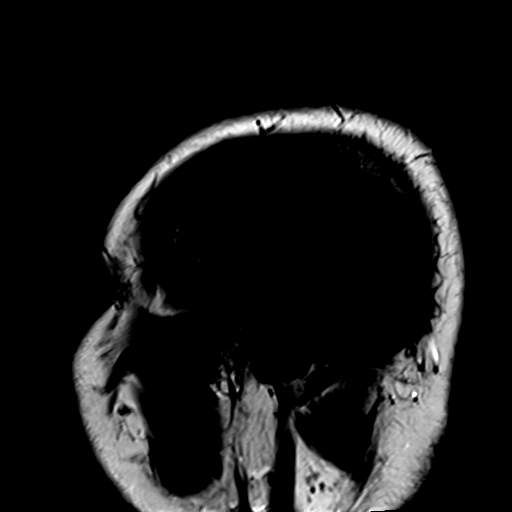

[Series 3: ep2d_diff_3 · axial · 3.0mm · 1.95mm/px · z∈[-36,+111]mm · 8 of 100 slices shown]
[im 1/100]
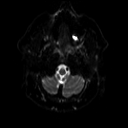
[im 19/100]
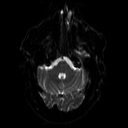
[im 28/100]
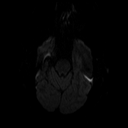
[im 46/100]
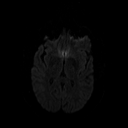
[im 55/100]
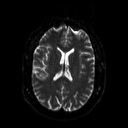
[im 73/100]
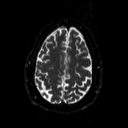
[im 82/100]
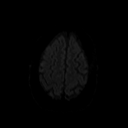
[im 100/100]
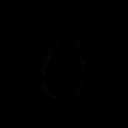

[Series 4: ep2d_diff_3_adc · axial · 3.0mm · 1.95mm/px · z∈[-36,+60]mm · 5 of 50 slices shown]
[im 1/50]
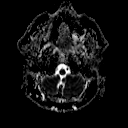
[im 9/50]
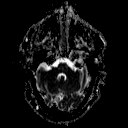
[im 17/50]
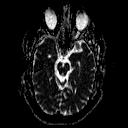
[im 25/50]
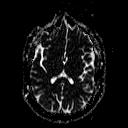
[im 33/50]
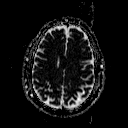

[Series 5: T2 · axial · 5.0mm · 0.49mm/px · z∈[-31,+105]mm · 2 of 22 slices shown (1 of 2)]
[im 1/22]
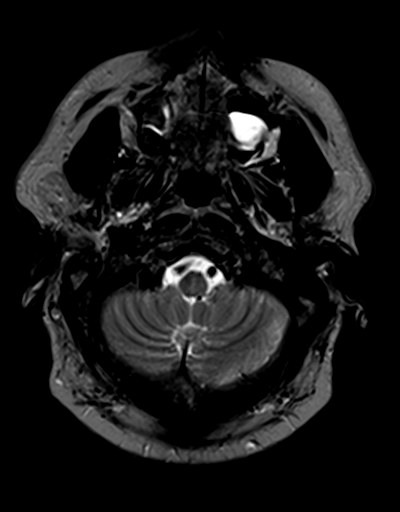
[im 22/22]
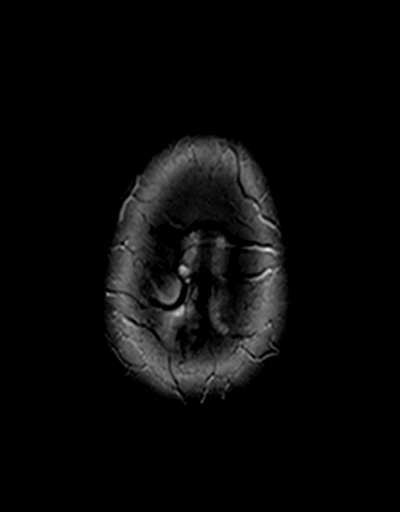

[Series 6: GRE · axial · 5.0mm · 0.49mm/px · z∈[-31,+105]mm · 2 of 22 slices shown]
[im 1/22]
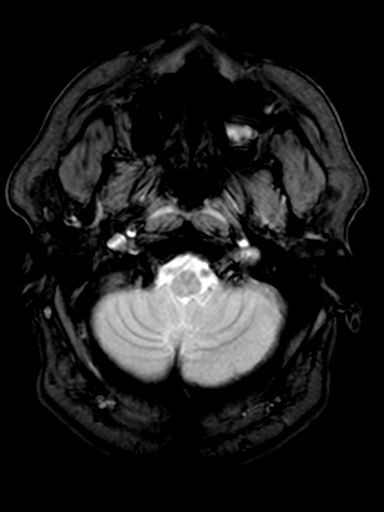
[im 22/22]
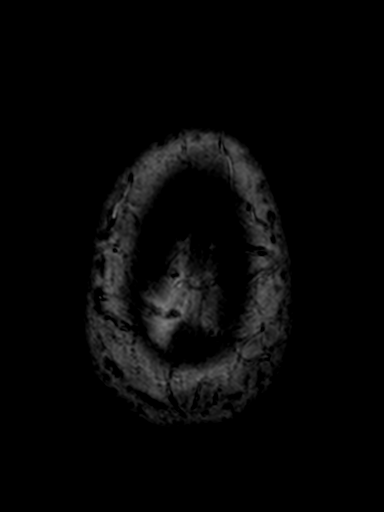

[Series 7: FLAIR · axial · 5.0mm · 0.49mm/px · z∈[-31,+105]mm · 2 of 22 slices shown]
[im 1/22]
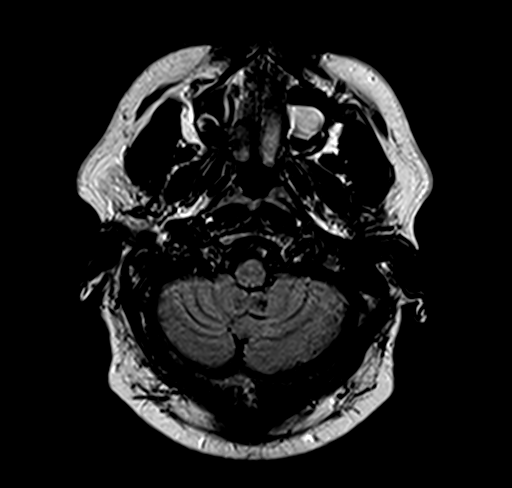
[im 22/22]
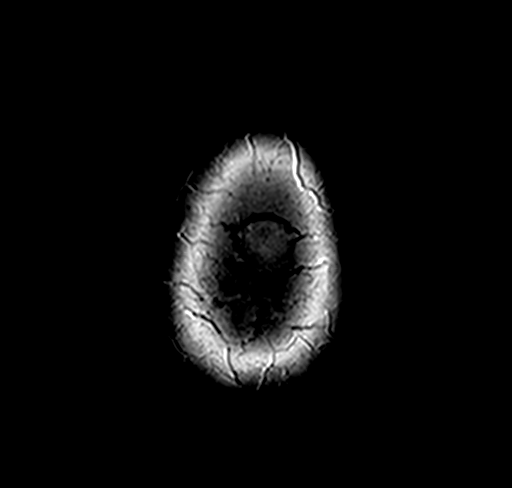

[Series 8: T1 · sagittal · 3.0mm · 0.35mm/px · 1 of 11 slices shown (1 of 2)]
[im 1/11]
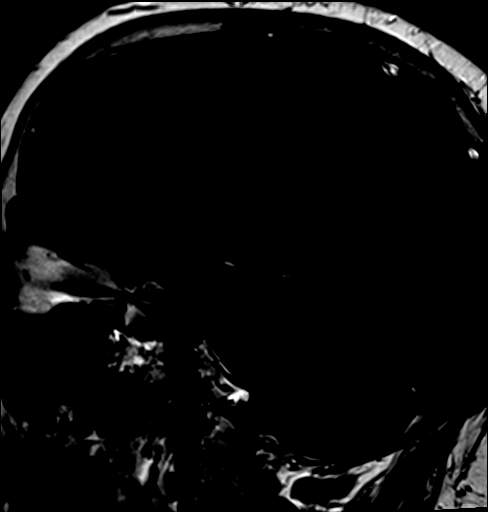

[Series 9: T1 · coronal · 3.0mm · 0.35mm/px · 1 of 12 slices shown (2 of 2)]
[im 1/12]
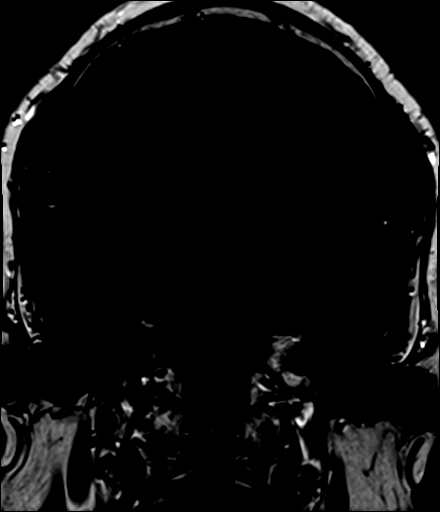

[Series 10: T2 · coronal · 3.0mm · 0.40mm/px · 1 of 12 slices shown (2 of 2)]
[im 1/12]
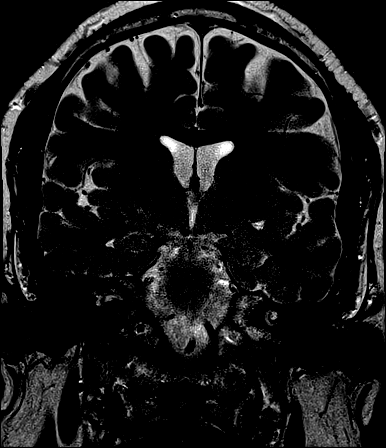

[Series 18: T1 post-contrast · sagittal · 3.0mm · 0.35mm/px · 1 of 11 slices shown (1 of 3)]
[im 1/11]
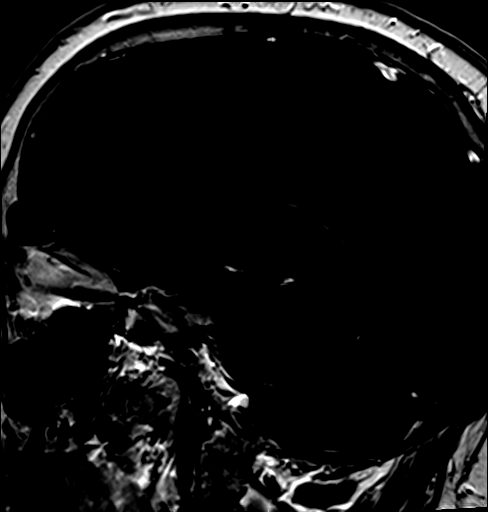

[Series 19: T1 post-contrast · coronal · 3.0mm · 0.35mm/px · 1 of 12 slices shown (2 of 3)]
[im 1/12]
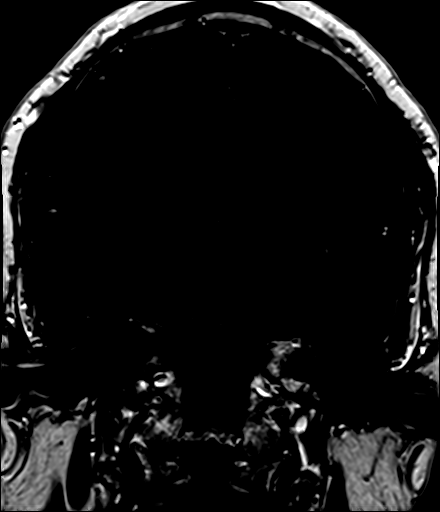

[Series 20: T1 post-contrast · axial · 2.0mm · 0.49mm/px · z∈[-37,+105]mm · 8 of 72 slices shown (3 of 3)]
[im 1/72]
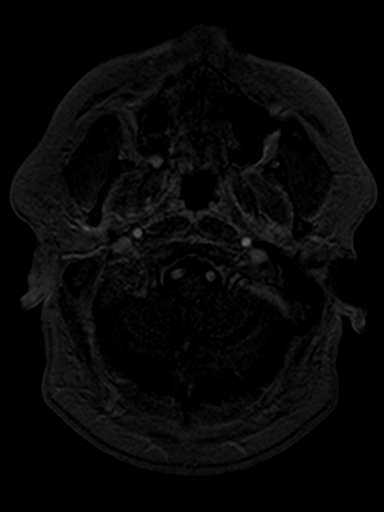
[im 11/72]
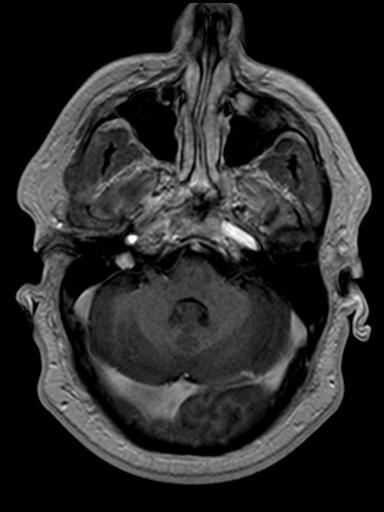
[im 21/72]
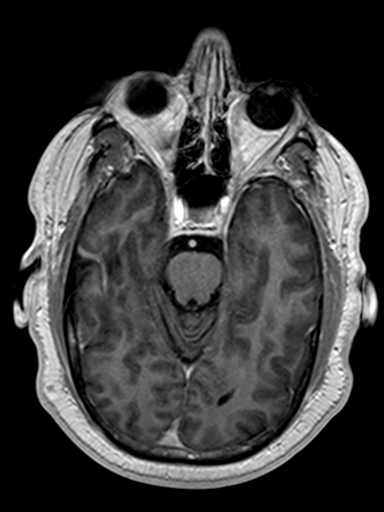
[im 31/72]
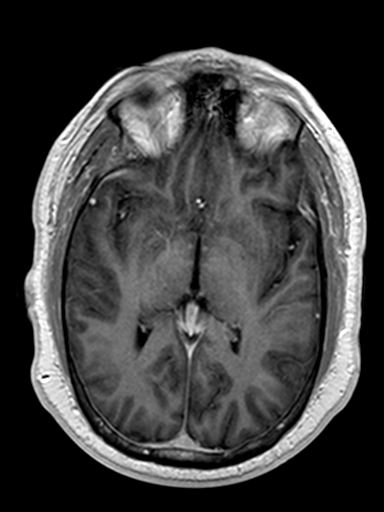
[im 41/72]
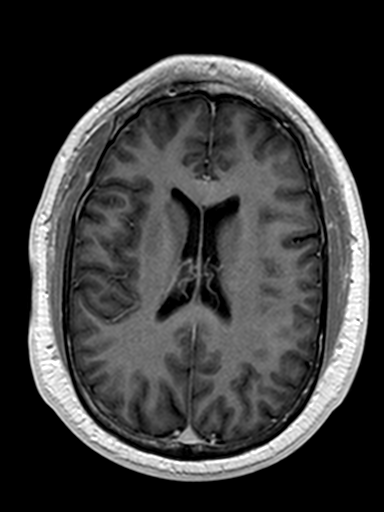
[im 51/72]
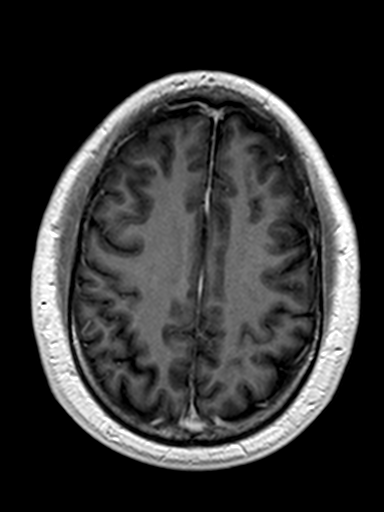
[im 61/72]
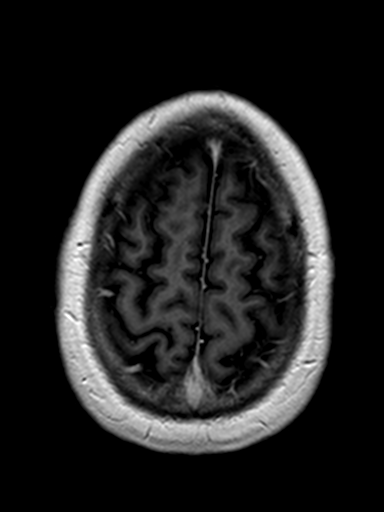
[im 72/72]
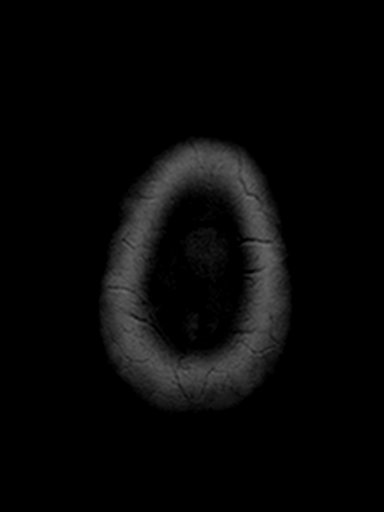

[35 of 48 positions shown; findings below may reference images not displayed]

FINDINGS: Sella: Infundibulum is midline.  No sellar or suprasellar mass.

Brain: There is no acute infarction or intracranial hemorrhage.
There is no intracranial mass, mass effect, or edema. There is no
hydrocephalus or extra-axial fluid collection. Ventricles and sulci
are normal in size and configuration. No abnormal enhancement.

Vascular: Major vessel flow voids at the skull base are preserved.

Skull and upper cervical spine: Normal marrow signal is preserved.

Sinuses/Orbits: Maxillary sinus retention cysts or polyps
bilaterally. Orbits are unremarkable.

Other: Mastoid air cells are clear.
IMPRESSION: No sellar or suprasellar mass or other significant abnormality.

## 2021-07-25 MED ORDER — GADOBENATE DIMEGLUMINE 529 MG/ML IV SOLN
10.0000 mL | Freq: Once | INTRAVENOUS | Status: AC | PRN
  Administered 2021-07-25: 10 mL via INTRAVENOUS

## 2021-07-26 NOTE — Telephone Encounter (Signed)
Noted.  Will close encounter.  

## 2021-07-26 NOTE — Telephone Encounter (Signed)
We received a CMN for this patient today where they delivered CPAP supplies.

## 2021-07-30 ENCOUNTER — Encounter: Payer: Self-pay | Admitting: Family

## 2021-07-31 ENCOUNTER — Ambulatory Visit: Payer: 59 | Admitting: Internal Medicine

## 2021-07-31 ENCOUNTER — Encounter: Payer: Self-pay | Admitting: Internal Medicine

## 2021-07-31 VITALS — BP 160/82 | HR 98 | Resp 15 | Ht 72.0 in | Wt 282.0 lb

## 2021-07-31 DIAGNOSIS — M25572 Pain in left ankle and joints of left foot: Secondary | ICD-10-CM

## 2021-07-31 DIAGNOSIS — G8929 Other chronic pain: Secondary | ICD-10-CM

## 2021-07-31 DIAGNOSIS — M109 Gout, unspecified: Secondary | ICD-10-CM

## 2021-07-31 DIAGNOSIS — M25571 Pain in right ankle and joints of right foot: Secondary | ICD-10-CM | POA: Diagnosis not present

## 2021-07-31 NOTE — Telephone Encounter (Signed)
Called pt to make appt but pt stated was ok now
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
## 2021-08-01 LAB — SEDIMENTATION RATE: Sed Rate: 17 mm/h — ABNORMAL HIGH (ref 0–15)

## 2021-08-01 LAB — C-REACTIVE PROTEIN: CRP: 9.6 mg/L — ABNORMAL HIGH (ref <8.0)

## 2021-08-02 ENCOUNTER — Encounter: Payer: Self-pay | Admitting: Internal Medicine

## 2021-08-21 ENCOUNTER — Telehealth: Payer: Self-pay | Admitting: Internal Medicine

## 2021-08-21 DIAGNOSIS — G8929 Other chronic pain: Secondary | ICD-10-CM

## 2021-08-21 DIAGNOSIS — M109 Gout, unspecified: Secondary | ICD-10-CM

## 2021-08-21 NOTE — Telephone Encounter (Unsigned)
Patient called stating at his last appointment on 07/31/21 Dr. Benjamine Mola was referring him to a "foot specialist."  Patient states he hasn't heard from anyone regarding setting up an appointment and his foot pain is getting worse.  Patient requested a return call with the name of the facility he was referred to.

## 2021-08-24 MED ORDER — COLCHICINE 0.6 MG PO TABS: ORAL_TABLET | ORAL | 0 refills | Status: DC

## 2021-08-24 NOTE — Telephone Encounter (Signed)
Spoke with patient by phone and summarized in associated mychart message.

## 2021-08-24 NOTE — Addendum Note (Signed)
Addended by: Collier Salina on: 08/24/2021 04:43 PM   Modules accepted: Orders

## 2021-08-24 NOTE — Telephone Encounter (Signed)
Patient called checking the status of his message regarding the foot specialist Dr. Benjamine Mola wanted him to see.

## 2021-08-30 ENCOUNTER — Other Ambulatory Visit: Payer: Self-pay | Admitting: Family

## 2021-08-31 ENCOUNTER — Encounter: Payer: Self-pay | Admitting: Family

## 2021-09-10 MED ORDER — COLCHICINE 0.6 MG PO TABS: 0.6000 mg | ORAL_TABLET | Freq: Two times a day (BID) | ORAL | 0 refills | Status: DC | PRN

## 2021-09-10 NOTE — Telephone Encounter (Signed)
Next Visit: not on file  Last Visit: 07/31/2021  Last Fill: 08/24/2021  DX: Acute gouty arthritis  Current Dose per office note 07/31/2021: not discussed  Labs: 08/17/2021 Glucose 121, ALT 63  Okay to refill Colchicine?

## 2021-09-26 ENCOUNTER — Encounter: Payer: Self-pay | Admitting: Primary Care

## 2021-09-26 ENCOUNTER — Ambulatory Visit: Payer: 59 | Admitting: Primary Care

## 2021-09-26 DIAGNOSIS — G4733 Obstructive sleep apnea (adult) (pediatric): Secondary | ICD-10-CM | POA: Diagnosis not present

## 2021-09-26 NOTE — Assessment & Plan Note (Signed)
-   Patient is 100% compliant with CPAP use over the last 30 days. He received new CPAP machine in June. No issues with mask fit or pressure setting. Average usage 9 hours 41 mins. Current pressure 5-15cm h20; Residual AHI 0.9. No changes today. FU in 1 year or sooner if needed.

## 2021-09-26 NOTE — Progress Notes (Signed)
$'@Patient'K$  ID: Wayne Wise, male    DOB: November 19, 1980, 41 y.o.   MRN: [id number]  Chief Complaint  Patient presents with   Follow-up    Pt states he is doing good on CPAP    Referring provider: Marrian Salvage,*  HPI: 41 year old male, never smoked.  Past medical history significant for hypertension, OSA, traumatic brain injury, left knee arthroscopy, carpal tunnel syndrome, recurrent urticaria, headache syndrome.  Previous [facility] pulmonary encounter:  07/10/2021 Patient presents today for sleep consult.  Former patient of Dr. Elsworth Soho, last seen in office on 10/02/2015 for OSA.  Sleep study in April 2017 showed severe obstructive sleep apnea, AHI 77/hr. patient underwent Pap titration which showed optimal pressure of 13 cm H2O. Last download from July 2017 showed patient was on auto setting 5-15cm h20. He continues to wear CPAP. He received message on his machine stating motor at exceeded lifetime. Machines is still in working condition but 43 years old. He is compliant with CPAP every night, wearing on average 9 hours a night.   He can not sleep without using his CPAP. Without CPAP he does notice he stops breathing. Typical bedtime is 9 PM.  It takes him on average 20 minutes to fall asleep.  He wakes up 1-2 times a night.  He starts his day at 6:30 AM.  DME was [facility] in 2017. He gets supplies through [place].  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   Airview download 05/02/2017-05/31/2017 Usage 30/30 days (100%); 30 days (100%) greater than 4 hours Average usage 9 hours 36 minutes Pressure 5 to 15 cm H2O (10.3 cm H2O- 95%) Air leaks 3.8 L/min (95%) AHI 0.4  09/26/2021- Interim hx  Patient presents today for compliance check. He received new CPAP machine in June. He is 100% compliant with use last 30 days. Current pressure 5-15cm h20. He is sleeping well at night, getting average 9 hours a night. No residual daytime sleepiness. No issues with mask fit or pressure setting. Epworth score 3. DME  company in [place].   Airview download 08/26/21-09/24/21 30/30 days; 100% > 4 hours  Average usage 9 hours 42 mins Pressure 5-15cm h20 (11.2cm h20-95%) Airleaks 2.7L/min AHI 0.9    Allergies  Allergen Reactions   Other Hives and Anaphylaxis    Beef,pork,bison,lamb,venison Mammal - Red Meat   Balsam    Bismuth Subnitrate    Castor Oil    Hydrocodone     Dizzy    Lanolin    Poison Ivy Extract    Sulfa Antibiotics Swelling and Other (See Comments)    Swelling of fingers    Immunization History  Administered Date(s) Administered   Influenza,inj,Quad PF,6+ Mos 12/29/2019, 12/17/2020   Influenza,inj,quad, With Preservative 11/06/2013, 10/12/2017   Influenza-Unspecified 01/01/2015   PFIZER(Purple Top)SARS-COV-2 Vaccination 09/21/2019, 10/17/2019   Tdap 04/27/2003, 08/19/2013    Past Medical History:  Diagnosis Date   Allergy to alpha-gal    Arthritis    Carpal tunnel syndrome    Bilateral   Chicken pox as child   Gout    Headache syndrome 07/01/2017   Headaches due to old head trauma    History of kidney stones    Hypertension    Migraines    Sleep apnea    cpap autoset starts at 5   TBI (traumatic brain injury) ([place]) 2011    Tobacco History: Social History   Tobacco Use  Smoking Status Never  Smokeless Tobacco Former   Types: Chew   Quit date: 2012  Counseling given: Not Answered   Outpatient Medications Prior to Visit  Medication Sig Dispense Refill   allopurinol (ZYLOPRIM) 300 MG tablet Take 1 tablet (300 mg total) by mouth daily. 90 tablet 1   anastrozole (ARIMIDEX) 1 MG tablet Take 1 mg by mouth daily.     colchicine 0.6 MG tablet Take 1 tablet (0.6 mg total) by mouth 2 (two) times daily as needed. 60 tablet 0   EPINEPHrine (AUVI-Q) 0.3 mg/0.3 mL IJ SOAJ injection Use as directed for severe allergic reactions 2 each 1   EPINEPHrine 0.3 mg/0.3 mL IJ SOAJ injection epinephrine 0.3 mg/0.3 mL injection, auto-injector     famotidine (PEPCID) 20 MG  tablet 1 tablet 1-2 times a day if needed for itch or hives 180 tablet 3   fluticasone (FLONASE) 50 MCG/ACT nasal spray Place 2 sprays into both nostrils daily. 16 g 2   fluticasone (FLONASE) 50 MCG/ACT nasal spray Place 2 sprays into both nostrils daily. 16 g 6   indomethacin (INDOCIN) 50 MG capsule Use tid prn for gout flare as directed 60 capsule 1   levocetirizine (XYZAL) 5 MG tablet TAKE ONE TABLET BY MOUTH ONCE OR TWICE A DAY AS NEEDED FOR ITCH AND HIVES 90 tablet 3   montelukast (SINGULAIR) 10 MG tablet Take 1 tablet as needed for itch or hives 90 tablet 3   ofloxacin (OCUFLOX) 0.3 % ophthalmic solution Place 1 drop into both eyes 4 (four) times daily. 10 mL 0   UNABLE TO FIND Med Name: Mega D3 & MK-7 (Bone and Cardiovascular Support)     valsartan-hydrochlorothiazide (DIOVAN-HCT) 160-25 MG tablet TAKE ONE TABLET BY MOUTH ONE TIME DAILY 90 tablet 3   No facility-administered medications prior to visit.   Review of Systems  Review of Systems  Constitutional: Negative.   HENT: Negative.    Respiratory: Negative.    Cardiovascular: Negative.     Physical Exam  BP 126/74 (BP Location: Right Arm, Patient Position: Sitting, Cuff Size: Large)   Pulse 100   Temp 98.6 F (37 C) (Oral)   Ht 6' (1.829 m)   Wt 282 lb (127.9 kg)   SpO2 97%   BMI 38.25 kg/m  Physical Exam Constitutional:      Appearance: Normal appearance.  HENT:     Head: Normocephalic and atraumatic.  Cardiovascular:     Rate and Rhythm: Normal rate and regular rhythm.  Pulmonary:     Effort: Pulmonary effort is normal.     Breath sounds: Normal breath sounds.  Musculoskeletal:        General: Normal range of motion.  Skin:    General: Skin is warm and dry.  Neurological:     General: No focal deficit present.     Mental Status: He is alert and oriented to person, place, and time. Mental status is at baseline.  Psychiatric:        Mood and Affect: Mood normal.        Behavior: Behavior normal.         Thought Content: Thought content normal.        Judgment: Judgment normal.      Lab Results:  CBC    Component Value Date/Time   WBC 6.1 04/03/2021 0926   RBC 4.95 04/03/2021 0926   HGB 15.5 04/03/2021 0926   HGB 15.2 03/26/2018 1511   HCT 46.6 04/03/2021 0926   HCT 44.8 03/26/2018 1511   PLT 258.0 04/03/2021 0926   PLT 254 03/26/2018 1511  MCV 94.0 04/03/2021 0926   MCV 93 03/26/2018 1511   MCH 31.5 03/26/2018 1511   MCH 31.6 01/13/2018 1254   MCHC 33.3 04/03/2021 0926   RDW 12.9 04/03/2021 0926   RDW 12.6 03/26/2018 1511   LYMPHSABS 1.6 04/03/2021 0926   MONOABS 0.5 04/03/2021 0926   EOSABS 0.2 04/03/2021 0926   BASOSABS 0.1 04/03/2021 0926    BMET    Component Value Date/Time   NA 138 05/15/2021 0847   NA 140 03/26/2018 1511   K 3.6 05/15/2021 0847   CL 99 05/15/2021 0847   CO2 28 05/15/2021 0847   GLUCOSE 167 (H) 05/15/2021 0847   BUN 20 05/15/2021 0847   BUN 19 03/26/2018 1511   CREATININE 1.02 05/15/2021 0847   CREATININE 0.89 04/13/2020 0954   CALCIUM 9.7 05/15/2021 0847   GFRNONAA 107 03/26/2018 1511   GFRAA 124 03/26/2018 1511    BNP No results found for: "BNP"  ProBNP No results found for: "PROBNP"  Imaging: No results found.   Assessment & Plan:   OSA (obstructive sleep apnea) - Patient is 100% compliant with CPAP use over the last 30 days. He received new CPAP machine in June. No issues with mask fit or pressure setting. Average usage 9 hours 41 mins. Current pressure 5-15cm h20; Residual AHI 0.9. No changes today. FU in 1 year or sooner if needed.      Martyn Ehrich, NP 09/26/2021

## 2021-09-26 NOTE — Patient Instructions (Signed)
Nice seeing you today Wayne Wise Excellent compliance with CPAP, no changes needed to pressure settings  Recommendations:  Continue to wear CPAP every night for minimum of 4 to 6 hours or longer Do not drive if experiencing excessive daytime sleepiness or fatigue  Follow-up 1 year with [facility] NP or sooner if needed (call should already be in for May)  CPAP and BIPAP Information CPAP and BIPAP are methods that use air pressure to keep your airways open and to help you breathe well. CPAP and BIPAP use different amounts of pressure. Your health care provider will tell you whether CPAP or BIPAP would be more helpful for you. CPAP stands for "continuous positive airway pressure." With CPAP, the amount of pressure stays the same while you breathe in (inhale) and out (exhale). BIPAP stands for "bi-level positive airway pressure." With BIPAP, the amount of pressure will be higher when you inhale and lower when you exhale. This allows you to take larger breaths. CPAP or BIPAP may be used in the hospital, or your health care provider may want you to use it at home. You may need to have a sleep study before your health care provider can order a machine for you to use at home. What are the advantages? CPAP or BIPAP can be helpful if you have: Sleep apnea. Chronic obstructive pulmonary disease (COPD). Heart failure. Medical conditions that cause muscle weakness, including muscular dystrophy or amyotrophic lateral sclerosis (ALS). Other problems that cause breathing to be shallow, weak, abnormal, or difficult. CPAP and BIPAP are most commonly used for obstructive sleep apnea (OSA) to keep the airways from collapsing when the muscles relax during sleep. What are the risks? Generally, this is a safe treatment. However, problems may occur, including: Irritated skin or skin sores if the mask does not fit properly. Dry or stuffy nose or nosebleeds. Dry mouth. Feeling gassy or bloated. Sinus or lung infection  if the equipment is not cleaned properly. When should CPAP or BIPAP be used? In most cases, the mask only needs to be worn during sleep. Generally, the mask needs to be worn throughout the night and during any daytime naps. People with certain medical conditions may also need to wear the mask at other times, such as when they are awake. Follow instructions from your health care provider about when to use the machine. What happens during CPAP or BIPAP?  Both CPAP and BIPAP are provided by a small machine with a flexible plastic tube that attaches to a plastic mask that you wear. Air is blown through the mask into your nose or mouth. The amount of pressure that is used to blow the air can be adjusted on the machine. Your health care provider will set the pressure setting and help you find the best mask for you. Tips for using the mask Because the mask needs to be snug, some people feel trapped or closed-in (claustrophobic) when first using the mask. If you feel this way, you may need to get used to the mask. One way to do this is to hold the mask loosely over your nose or mouth and then gradually apply the mask more snugly. You can also gradually increase the amount of time that you use the mask. Masks are available in various types and sizes. If your mask does not fit well, talk with your health care provider about getting a different one. Some common types of masks include: Full face masks, which fit over the mouth and nose. Nasal masks,  which fit over the nose. Nasal pillow or prong masks, which fit into the nostrils. If you are using a mask that fits over your nose and you tend to breathe through your mouth, a chin strap may be applied to help keep your mouth closed. Use a skin barrier to protect your skin as told by your health care provider. Some CPAP and BIPAP machines have alarms that may sound if the mask comes off or develops a leak. If you have trouble with the mask, it is very important that  you talk with your health care provider about finding a way to make the mask easier to tolerate. Do not stop using the mask. There could be a negative impact on your health if you stop using the mask. Tips for using the machine Place your CPAP or BIPAP machine on a secure table or stand near an electrical outlet. Know where the on/off switch is on the machine. Follow instructions from your health care provider about how to set the pressure on your machine and when you should use it. Do not eat or drink while the CPAP or BIPAP machine is on. Food or fluids could get pushed into your lungs by the pressure of the CPAP or BIPAP. For home use, CPAP and BIPAP machines can be rented or purchased through home health care companies. Many different brands of machines are available. Renting a machine before purchasing may help you find out which particular machine works well for you. Your health insurance company may also decide which machine you may get. Keep the CPAP or BIPAP machine and attachments clean. Ask your health care provider for specific instructions. Check the humidifier if you have a dry stuffy nose or nosebleeds. Make sure it is working correctly. Follow these instructions at home: Take over-the-counter and prescription medicines only as told by your health care provider. Ask if you can take sinus medicine if your sinuses are blocked. Do not use any products that contain nicotine or tobacco. These products include cigarettes, chewing tobacco, and vaping devices, such as e-cigarettes. If you need help quitting, ask your health care provider. Keep all follow-up visits. This is important. Contact a health care provider if: You have redness or pressure sores on your head, face, mouth, or nose from the mask or head gear. You have trouble using the CPAP or BIPAP machine. You cannot tolerate wearing the CPAP or BIPAP mask. Someone tells you that you snore even when wearing your CPAP or BIPAP. Get  help right away if: You have trouble breathing. You feel confused. Summary CPAP and BIPAP are methods that use air pressure to keep your airways open and to help you breathe well. If you have trouble with the mask, it is very important that you talk with your health care provider about finding a way to make the mask easier to tolerate. Do not stop using the mask. There could be a negative impact to your health if you stop using the mask. Follow instructions from your health care provider about when to use the machine. This information is not intended to replace advice given to you by your health care provider. Make sure you discuss any questions you have with your health care provider. Document Revised: 09/06/2020 Document Reviewed: 01/07/2020 Elsevier Patient Education  [place].

## 2021-10-16 ENCOUNTER — Other Ambulatory Visit: Payer: Self-pay | Admitting: Internal Medicine

## 2021-10-16 NOTE — Telephone Encounter (Signed)
Recommend we follow up in about 1 month if possible or soonest after that.

## 2021-10-16 NOTE — Telephone Encounter (Signed)
Next Visit: Not on file.  Last Visit: 07/31/2021  Last Fill: 09/10/2021  DX: Acute gouty arthritis  Current Dose per office note 07/31/2021: not discussed   Labs: 08/17/2021 Glucose 121, ALT 63, Uric Acid 07/13/2021 6.1  Okay to refill Colchicine? When should patient follow up in the office?
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
## 2021-10-30 ENCOUNTER — Encounter: Payer: Self-pay | Admitting: Internal Medicine

## 2021-10-30 ENCOUNTER — Ambulatory Visit: Payer: 59 | Attending: Internal Medicine | Admitting: Internal Medicine

## 2021-10-30 VITALS — BP 124/83 | HR 72 | Resp 13 | Ht 72.0 in | Wt 281.8 lb

## 2021-10-30 DIAGNOSIS — M109 Gout, unspecified: Secondary | ICD-10-CM

## 2021-10-30 DIAGNOSIS — M7712 Lateral epicondylitis, left elbow: Secondary | ICD-10-CM

## 2021-10-30 DIAGNOSIS — M25579 Pain in unspecified ankle and joints of unspecified foot: Secondary | ICD-10-CM | POA: Diagnosis not present

## 2021-10-30 MED ORDER — NITROGLYCERIN 0.2 MG/HR TD PT24: MEDICATED_PATCH | TRANSDERMAL | 0 refills | Status: DC

## 2021-10-31 ENCOUNTER — Telehealth: Payer: Self-pay | Admitting: Internal Medicine

## 2021-10-31 LAB — COMPLETE METABOLIC PANEL WITH GFR
AG Ratio: 2 (calc) (ref 1.0–2.5)
ALT: 90 U/L — ABNORMAL HIGH (ref 9–46)
AST: 39 U/L (ref 10–40)
Albumin: 5.1 g/dL (ref 3.6–5.1)
Alkaline phosphatase (APISO): 84 U/L (ref 36–130)
BUN: 14 mg/dL (ref 7–25)
CO2: 28 mmol/L (ref 20–32)
Calcium: 10.5 mg/dL — ABNORMAL HIGH (ref 8.6–10.3)
Chloride: 101 mmol/L (ref 98–110)
Creat: 0.88 mg/dL (ref 0.60–1.29)
Globulin: 2.6 g/dL (calc) (ref 1.9–3.7)
Glucose, Bld: 76 mg/dL (ref 65–99)
Potassium: 3.9 mmol/L (ref 3.5–5.3)
Sodium: 140 mmol/L (ref 135–146)
Total Bilirubin: 0.4 mg/dL (ref 0.2–1.2)
Total Protein: 7.7 g/dL (ref 6.1–8.1)
eGFR: 109 mL/min/{1.73_m2} (ref >=60)

## 2021-10-31 LAB — CBC WITH DIFFERENTIAL/PLATELET
Absolute Monocytes: 717 cells/uL (ref 200–950)
Basophils Absolute: 121 cells/uL (ref 0–200)
Basophils Relative: 1.7 %
Eosinophils Absolute: 263 cells/uL (ref 15–500)
Eosinophils Relative: 3.7 %
HCT: 49.8 % (ref 38.5–50.0)
Hemoglobin: 17.4 g/dL — ABNORMAL HIGH (ref 13.2–17.1)
Lymphs Abs: 2364 cells/uL (ref 850–3900)
MCH: 32 pg (ref 27.0–33.0)
MCHC: 34.9 g/dL (ref 32.0–36.0)
MCV: 91.5 fL (ref 80.0–100.0)
MPV: 10.7 fL (ref 7.5–12.5)
Monocytes Relative: 10.1 %
Neutro Abs: 3635 cells/uL (ref 1500–7800)
Neutrophils Relative %: 51.2 %
Platelets: 255 10*3/uL (ref 140–400)
RBC: 5.44 10*6/uL (ref 4.20–5.80)
RDW: 12.4 % (ref 11.0–15.0)
Total Lymphocyte: 33.3 %
WBC: 7.1 10*3/uL (ref 3.8–10.8)

## 2021-10-31 LAB — SEDIMENTATION RATE: Sed Rate: 6 mm/h (ref 0–15)

## 2021-10-31 NOTE — Telephone Encounter (Signed)
Called pharmacy and confirmed dosage amount with pharmacist.

## 2021-10-31 NOTE — Telephone Encounter (Signed)
Pharmacist at [facility] requested a return call to verify dosage for NitroGlycerin patch.   Phone [phone]

## 2021-11-01 ENCOUNTER — Encounter: Payer: Self-pay | Admitting: Internal Medicine

## 2021-11-02 NOTE — Telephone Encounter (Signed)
I spoke with Wayne Wise he already tried switching to the 1 total patch and quickly developed headache so he discontinued this.  He was not having any side effects with the half patch and so we will go back to the reduced dose.  I discussed that this treatment does not directly improve lateral epicondylitis but is intended to accelerate the normal healing process that we will still take usually a few weeks.  He is seeing improvement with use of the tennis elbow brace.

## 2021-11-02 NOTE — Progress Notes (Signed)
I spoke with Wayne Wise his lab results show a increase in ALT compared to previous now about 2 times upper limit of normal.  He does not report any preceding changes in behavior or diet did take ibuprofen but only 600 mg dose prior to lab visit.  I discussed this could be a side effect of his colchicine.  He is seeing a good improvement with the inflammation on colchicine so would like to continue taking this drug without reduction right now.  Recommend he return for lab only check up for liver function tests in about 4 weeks to confirm if this is a trend he agrees with this plan.

## 2021-11-13 ENCOUNTER — Other Ambulatory Visit: Payer: Self-pay | Admitting: Internal Medicine

## 2021-11-13 ENCOUNTER — Ambulatory Visit: Payer: 59 | Admitting: Dermatology

## 2021-11-14 MED ORDER — COLCHICINE 0.6 MG PO TABS: 0.6000 mg | ORAL_TABLET | Freq: Two times a day (BID) | ORAL | 0 refills | Status: DC | PRN

## 2021-11-14 NOTE — Telephone Encounter (Signed)
Next Visit: 04/29/2021  Last Visit: 10/30/2021  Last Fill: 10/16/2021  DX: Acute gouty arthritis  Current Dose per office note 10/30/2021: one tablet twice daily as needed  Labs: CBC 10/30/2021 Hemoglobin 17.4,  CMP 10/30/2021 Calcium 10.5, ALT 90 UA 08/17/2021 Appearance Cloudy, Ketones Trace, spec gravity >1.035, Blood LARGE, PH UR 5.0, Protein, UR 30  Okay to refill colchicine?

## 2021-12-04 ENCOUNTER — Other Ambulatory Visit: Payer: Self-pay | Admitting: Family

## 2021-12-04 ENCOUNTER — Other Ambulatory Visit: Payer: Self-pay | Admitting: *Deleted

## 2021-12-04 DIAGNOSIS — M109 Gout, unspecified: Secondary | ICD-10-CM

## 2021-12-05 LAB — HEPATIC FUNCTION PANEL
AG Ratio: 1.9 (calc) (ref 1.0–2.5)
ALT: 81 U/L — ABNORMAL HIGH (ref 9–46)
AST: 35 U/L (ref 10–40)
Albumin: 4.8 g/dL (ref 3.6–5.1)
Alkaline phosphatase (APISO): 91 U/L (ref 36–130)
Bilirubin, Direct: 0.1 mg/dL (ref 0.0–0.2)
Globulin: 2.5 g/dL (calc) (ref 1.9–3.7)
Indirect Bilirubin: 0.3 mg/dL (calc) (ref 0.2–1.2)
Total Bilirubin: 0.4 mg/dL (ref 0.2–1.2)
Total Protein: 7.3 g/dL (ref 6.1–8.1)

## 2021-12-07 NOTE — Progress Notes (Signed)
Liver enzyme test remains mildly above normal although slightly decreased compared to a month earlier (81 down from 90). The other liver enzyme tests are completely normal so I am not sure why just the one is high. I think this is safe to continue taking colchicine.

## 2021-12-24 ENCOUNTER — Other Ambulatory Visit: Payer: Self-pay | Admitting: Internal Medicine

## 2021-12-24 MED ORDER — COLCHICINE 0.6 MG PO TABS: 0.6000 mg | ORAL_TABLET | Freq: Two times a day (BID) | ORAL | 5 refills | Status: DC | PRN

## 2021-12-24 NOTE — Telephone Encounter (Signed)
Next Visit: 04/30/2022  Last Visit: 10/30/2021  Last Fill: 11/14/2021  DX: Acute gouty arthritis   Current Dose per office note on 10/30/2021: colchicine 0.6 mg twice daily.   Labs: 10/30/2021 calcium 10.5, ALT 90, hemoglobin 17.4 12/04/2021 Liver enzyme test remains mildly above normal although slightly decreased compared to a month earlier (81 down from 90). The other liver enzyme tests are completely normal so I am not sure why just the one is high. I think this is safe to continue taking colchicine.  Okay to refill colchicine?
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
## 2022-04-30 ENCOUNTER — Ambulatory Visit: Payer: 59 | Attending: Internal Medicine | Admitting: Internal Medicine

## 2022-04-30 ENCOUNTER — Encounter: Payer: Self-pay | Admitting: Internal Medicine

## 2022-04-30 VITALS — BP 136/79 | HR 91 | Resp 14 | Ht 72.0 in | Wt 288.0 lb

## 2022-04-30 DIAGNOSIS — M722 Plantar fascial fibromatosis: Secondary | ICD-10-CM | POA: Diagnosis not present

## 2022-04-30 DIAGNOSIS — Z91018 Allergy to other foods: Secondary | ICD-10-CM

## 2022-04-30 DIAGNOSIS — M109 Gout, unspecified: Secondary | ICD-10-CM

## 2022-05-01 LAB — COMPLETE METABOLIC PANEL WITH GFR
AG Ratio: 1.8 (calc) (ref 1.0–2.5)
ALT: 94 U/L — ABNORMAL HIGH (ref 9–46)
AST: 48 U/L — ABNORMAL HIGH (ref 10–40)
Albumin: 5 g/dL (ref 3.6–5.1)
Alkaline phosphatase (APISO): 106 U/L (ref 36–130)
BUN/Creatinine Ratio: 13 (calc) (ref 6–22)
BUN: 19 mg/dL (ref 7–25)
CO2: 29 mmol/L (ref 20–32)
Calcium: 10.2 mg/dL (ref 8.6–10.3)
Chloride: 99 mmol/L (ref 98–110)
Creat: 1.47 mg/dL — ABNORMAL HIGH (ref 0.60–1.29)
Globulin: 2.8 g/dL (calc) (ref 1.9–3.7)
Glucose, Bld: 85 mg/dL (ref 65–99)
Potassium: 4 mmol/L (ref 3.5–5.3)
Sodium: 142 mmol/L (ref 135–146)
Total Bilirubin: 0.5 mg/dL (ref 0.2–1.2)
Total Protein: 7.8 g/dL (ref 6.1–8.1)
eGFR: 60 mL/min/{1.73_m2} (ref >=60)

## 2022-05-01 LAB — SEDIMENTATION RATE: Sed Rate: 6 mm/h (ref 0–15)

## 2022-05-01 LAB — URIC ACID: Uric Acid, Serum: 6.3 mg/dL (ref 4.0–8.0)

## 2022-06-04 ENCOUNTER — Other Ambulatory Visit: Payer: Self-pay | Admitting: Family

## 2022-07-02 ENCOUNTER — Encounter: Payer: Self-pay | Admitting: Family Medicine

## 2022-07-02 ENCOUNTER — Other Ambulatory Visit: Payer: Self-pay

## 2022-07-02 ENCOUNTER — Telehealth: Payer: Self-pay

## 2022-07-02 ENCOUNTER — Ambulatory Visit: Payer: 59 | Admitting: Family Medicine

## 2022-07-02 VITALS — BP 126/88 | HR 93 | Temp 98.7°F | Resp 22 | Wt 286.1 lb

## 2022-07-02 DIAGNOSIS — Z91018 Allergy to other foods: Secondary | ICD-10-CM | POA: Diagnosis not present

## 2022-07-02 DIAGNOSIS — L508 Other urticaria: Secondary | ICD-10-CM | POA: Diagnosis not present

## 2022-07-02 DIAGNOSIS — J3089 Other allergic rhinitis: Secondary | ICD-10-CM | POA: Diagnosis not present

## 2022-07-02 MED ORDER — LEVOCETIRIZINE DIHYDROCHLORIDE 5 MG PO TABS: 5.0000 mg | ORAL_TABLET | Freq: Two times a day (BID) | ORAL | 5 refills | Status: DC

## 2022-07-02 MED ORDER — FAMOTIDINE 20 MG PO TABS: 20.0000 mg | ORAL_TABLET | Freq: Two times a day (BID) | ORAL | 3 refills | Status: DC

## 2022-07-02 MED ORDER — MONTELUKAST SODIUM 10 MG PO TABS: 10.0000 mg | ORAL_TABLET | Freq: Every day | ORAL | 5 refills | Status: DC

## 2022-07-02 MED ORDER — TRIAMCINOLONE ACETONIDE 0.1 % EX OINT: 1.0000 | TOPICAL_OINTMENT | Freq: Two times a day (BID) | CUTANEOUS | 0 refills | Status: DC

## 2022-07-02 MED ORDER — FAMOTIDINE 20 MG PO TABS: ORAL_TABLET | ORAL | 3 refills | Status: DC

## 2022-07-02 NOTE — Telephone Encounter (Signed)
Called patient for update after vasovagal syncope episode. He states he arrived at home safely. He sat for a bit then felt very tired he took a one hour nap he is back to feeling somewhat normal. He is up and feeding the animals and then he will pick up his medications. He appreciates [place] worriyng and checking on him, but he is fine.

## 2022-07-02 NOTE — Progress Notes (Signed)
[address] [place] [address] Dept: [phone]  FOLLOW UP NOTE  Patient ID: Wayne Wise, male    DOB: [birth year]  Age: 42 y.o. MRN: [id number] Date of Office Visit: 07/02/2022  Assessment  Chief Complaint: Follow-up and Urticaria (Alphagal )  HPI Wayne Wise is a 42 year old male who presents to the clinic for follow-up visit.  He was last seen in this clinic on 08/04/2019 by Thermon Leyland, FNP, for evaluation of allergic rhinitis, chronic urticaria, and alpha gal allergy.    At today's visit, he reports that he began to experience widespread itch occurring 2 days ago and began to develop hives occurring over his body last night.  He has a history of alpha gal allergy with his last alpha gal lab work on 03/26/2018 with alpha gal IgE 1.04.  He reports that on Christmas Eve 2023 he began to eat prime rib and when he did not have any adverse reaction, he continued to eat mammalian meat.  He had a slightly elevated chronic urticaria reading on 03/26/2018.  He denies new medications, personal care products, foods, recent illness, or insect stings.  He reports that yesterday he began taking levocetirizine, famotidine, and montelukast, however, these medications for expired.  Allergic rhinitis is reported as well-controlled with no symptoms including clear rhinorrhea, nasal congestion, sneezing, or postnasal drainage.  He occasionally takes an antihistamine and is not currently using Flonase or nasal saline rinses. His last environmental allergy testing was on 03/25/2018 was positive to mold and cockroach.  His last alpha gal panel was elevated on 03/25/2020.  His current medications are listed in the chart.  Of note, he did have a vasovagal reaction during the blood draw while in the clinic.  Patient reported feeling slightly dizzy during the blood draw and then went limp and began snoring loudly.  He was easily aroused, however, was confused as to events that had occurred during the blood draw.  He  was diaphoretic and pale at that time.  Otherwise, physical exam within normal limits.  Vital signs within normal limits.  He was observed for 30 minutes after the blood draw.  Upon discharge, vital signs normal, physical exam normal, mentation normal.  Drug Allergies:  Allergies  Allergen Reactions   Other Hives and Anaphylaxis    Beef,pork,bison,lamb,venison Mammal - Red Meat   Balsam    Bismuth Subnitrate    Castor Oil    Hydrocodone     Dizzy    Lanolin    Poison Ivy Extract    Sulfa Antibiotics Swelling and Other (See Comments)    Swelling of fingers    Physical Exam: BP 126/88 (BP Location: Right Arm, Patient Position: Sitting, Cuff Size: Normal)   Pulse 93   Temp 98.7 F (37.1 C) (Temporal)   Resp (!) 22   Wt 286 lb 1.6 oz (129.8 kg)   SpO2 96%   BMI 38.80 kg/m    Physical Exam Vitals reviewed.  Constitutional:      Appearance: Normal appearance.  HENT:     Head: Normocephalic and atraumatic.     Right Ear: Tympanic membrane normal.     Left Ear: Tympanic membrane normal.     Nose:     Comments: Bilateral nares normal.  Pharynx normal.  Ears normal.  Eyes normal.    Mouth/Throat:     Pharynx: Oropharynx is clear.  Eyes:     Conjunctiva/sclera: Conjunctivae normal.  Cardiovascular:     Rate and Rhythm: Normal  rate and regular rhythm.     Heart sounds: Normal heart sounds. No murmur heard. Pulmonary:     Effort: Pulmonary effort is normal.     Breath sounds: Normal breath sounds.     Comments: Lungs clear to auscultation Musculoskeletal:        General: Normal range of motion.  Skin:    General: Skin is warm and dry.     Comments: Large scattered raised red areas noted over body. No open areas or drainage noted.   Neurological:     Mental Status: He is alert and oriented to person, place, and time.  Psychiatric:        Mood and Affect: Mood normal.        Behavior: Behavior normal.        Thought Content: Thought content normal.        Judgment:  Judgment normal.     Diagnostics:    Assessment and Plan: 1. Allergy to alpha-gal   2. Chronic urticaria   3. Perennial allergic rhinitis     Meds ordered this encounter  Medications   montelukast (SINGULAIR) 10 MG tablet    Sig: Take 1 tablet (10 mg total) by mouth at bedtime.    Dispense:  30 tablet    Refill:  5   levocetirizine (XYZAL) 5 MG tablet    Sig: Take 1 tablet (5 mg total) by mouth 2 (two) times daily.    Dispense:  30 tablet    Refill:  5   famotidine (PEPCID) 20 MG tablet    Sig: Take 1 tablet (20 mg total) by mouth 2 (two) times daily.    Dispense:  60 tablet    Refill:  3   famotidine (PEPCID) 20 MG tablet    Sig: 1 tablet 1-2 times a day if needed for itch or hives    Dispense:  180 tablet    Refill:  3    Pt. Will call if needed.   triamcinolone ointment (KENALOG) 0.1 %    Sig: Apply 1 Application topically 2 (two) times daily. Apply to red and itchy areas under your face up to twice a day. Do not use this medication longer than 2 days in a row    Dispense:  30 g    Refill:  0    Patient Instructions  Allergic rhinitis Continue allergen avoidance measures directed toward mold and cockroach as listed below Continue an antihistamine once a day as needed for runny nose or itch Continue Flonase 2 sprays in each nostril once a day as needed for stuffy nose Consider saline nasal rinses as needed for nasal symptoms. Use this before any medicated nasal sprays for best result  Hives (urticaria) Take the fewest amount of medications possible while remaining hive free Cetirizine (Zyrtec) 10mg  twice a day and famotidine (Pepcid) 20 mg twice a day. If no symptoms for 7-14 days then decrease to. Cetirizine (Zyrtec) 10mg  twice a day and famotidine (Pepcid) 20 mg once a day.  If no symptoms for 7-14 days then decrease to. Cetirizine (Zyrtec) 10mg  twice a day.  If no symptoms for 7-14 days then decrease to. Cetirizine (Zyrtec) 10mg  once a day.  May use Benadryl  (diphenhydramine) as needed for breakthrough hives       If symptoms return, then step up dosage Restart montelukast 10 mg once a day Keep a detailed symptom journal including foods eaten, contact with allergens, medications taken, weather changes.  Begin triamcinolone ointment to red and  itchy areas underneath your face up to twice a day as needed.  Do not use this medication longer than 2 weeks in a row If no relief with the treatment plan above, begin prednisone 10 mg tablets.  Take 2 tablets once a day for 4 days and take 1 tablet once a day on the fifth day, then stop.  Alpha gal Continue avoidance of mammalian meats.  In case of an allergic reaction, take Benadryl 50 mg every 4 hours, and if life-threatening symptoms occur, inject with EpiPen 0.3 mg. A lab has been placed to help [place] evaluate your alpha gal allergy.  We will call you when the results become available.  Call the clinic if this treatment plan is not working well for you.  Follow up in 3 months or sooner if needed.   Return in about 3 months (around 10/02/2022), or if symptoms worsen or fail to improve.    Thank you for the opportunity to care for this patient.  Please do not hesitate to contact me with questions.  Thermon Leyland, FNP [facility]

## 2022-07-02 NOTE — Patient Instructions (Addendum)
Allergic rhinitis Continue allergen avoidance measures directed toward mold and cockroach as listed below Continue an antihistamine once a day as needed for runny nose or itch Continue Flonase 2 sprays in each nostril once a day as needed for stuffy nose Consider saline nasal rinses as needed for nasal symptoms. Use this before any medicated nasal sprays for best result  Hives (urticaria) Take the fewest amount of medications possible while remaining hive free Cetirizine (Zyrtec) 10mg  twice a day and famotidine (Pepcid) 20 mg twice a day. If no symptoms for 7-14 days then decrease to. Cetirizine (Zyrtec) 10mg  twice a day and famotidine (Pepcid) 20 mg once a day.  If no symptoms for 7-14 days then decrease to. Cetirizine (Zyrtec) 10mg  twice a day.  If no symptoms for 7-14 days then decrease to. Cetirizine (Zyrtec) 10mg  once a day.  May use Benadryl (diphenhydramine) as needed for breakthrough hives       If symptoms return, then step up dosage Restart montelukast 10 mg once a day Keep a detailed symptom journal including foods eaten, contact with allergens, medications taken, weather changes.  Begin triamcinolone ointment to red and itchy areas underneath your face up to twice a day as needed.  Do not use this medication longer than 2 weeks in a row If no relief with the treatment plan above, begin prednisone 10 mg tablets.  Take 2 tablets once a day for 4 days and take 1 tablet once a day on the fifth day, then stop.  Alpha gal Continue avoidance of mammalian meats.  In case of an allergic reaction, take Benadryl 50 mg every 4 hours, and if life-threatening symptoms occur, inject with EpiPen 0.3 mg. A lab has been placed to help [place] evaluate your alpha gal allergy.  We will call you when the results become available.  Call the clinic if this treatment plan is not working well for you.  Follow up in 3 months or sooner if needed.  Control of Mold Allergen Mold and fungi can grow on a  variety of surfaces provided certain temperature and moisture conditions exist.  Outdoor molds grow on plants, decaying vegetation and soil.  The major outdoor mold, Alternaria and Cladosporium, are found in very high numbers during hot and dry conditions.  Generally, a late Summer - Fall peak is seen for common outdoor fungal spores.  Rain will temporarily lower outdoor mold spore count, but counts rise rapidly when the rainy period ends.  The most important indoor molds are Aspergillus and Penicillium.  Dark, humid and poorly ventilated basements are ideal sites for mold growth.  The next most common sites of mold growth are the bathroom and the kitchen.  Outdoor [employer] Use air conditioning and keep windows closed Avoid exposure to decaying vegetation. Avoid leaf raking. Avoid grain handling. Consider wearing a face mask if working in moldy areas.  Indoor Mold Control Maintain humidity below 50%. Clean washable surfaces with 5% bleach solution. Remove sources e.g. Contaminated carpets.  Control of Cockroach Allergen Cockroach allergen has been identified as an important cause of acute attacks of asthma, especially in urban settings.  There are fifty-five species of cockroach that exist in the [place], however only three, the [place], [place] species produce allergen that can affect patients with Asthma.  Allergens can be obtained from fecal particles, egg casings and secretions from cockroaches.    Remove food sources. Reduce access to water. Seal access and entry points. Spray runways with 0.5-1% Diazinon or Chlorpyrifos  Blow boric acid power under stoves and refrigerator. Place bait stations (hydramethylnon) at feeding sites.

## 2022-07-04 LAB — ALPHA-GAL PANEL
Allergen Lamb IgE: 15.5 kU/L — AB
Beef IgE: 37.9 kU/L — AB
IgE (Immunoglobulin E), Serum: 367 IU/mL (ref 6–495)
O215-IgE Alpha-Gal: >100 kU/L — AB
Pork IgE: 24.6 kU/L — AB

## 2022-07-04 NOTE — Progress Notes (Signed)
Can you please let this patient know that his alpha gal panel was very elevated. Please have him avoid mammalian meats and have access to an epinephrine auto-injector set. We can recheck the levels in about one year. Thank you

## 2022-07-05 ENCOUNTER — Other Ambulatory Visit: Payer: Self-pay

## 2022-07-05 MED ORDER — EPINEPHRINE 0.3 MG/0.3ML IJ SOAJ: INTRAMUSCULAR | 1 refills | Status: DC

## 2022-07-09 ENCOUNTER — Telehealth: Payer: Self-pay | Admitting: Family Medicine

## 2022-07-09 MED ORDER — EPINEPHRINE 0.3 MG/0.3ML IJ SOAJ: INTRAMUSCULAR | 1 refills | Status: DC

## 2022-07-09 NOTE — Telephone Encounter (Signed)
Patient called and stated he wanted to switch the medication epinephrine to auvi-q due to how long they are and wants them to fit in his pocket.

## 2022-07-09 NOTE — Telephone Encounter (Signed)
Sent in [place] said it was ok to change

## 2022-07-31 ENCOUNTER — Other Ambulatory Visit: Payer: Self-pay | Admitting: Internal Medicine

## 2022-07-31 NOTE — Telephone Encounter (Signed)
Last Fill: 12/24/2021  Labs: 04/30/2022 AST 48, ALT 94, Creat. 1.47, Uric Acid 6.3  Next Visit: not on file  Last Visit: 04/30/2022  DX: Gout of foot, unspecified cause, unspecified chronicity, unspecified laterality   Current Dose per office note 04/30/2022: not discussed   Okay to refill Colchicine? When should patient follow up?

## 2022-08-06 ENCOUNTER — Other Ambulatory Visit: Payer: Self-pay | Admitting: *Deleted

## 2022-08-06 NOTE — Telephone Encounter (Signed)
See rx request. 

## 2022-09-15 ENCOUNTER — Other Ambulatory Visit: Payer: Self-pay | Admitting: Family

## 2022-09-17 ENCOUNTER — Ambulatory Visit (INDEPENDENT_AMBULATORY_CARE_PROVIDER_SITE_OTHER): Payer: 59 | Admitting: Family

## 2022-09-17 ENCOUNTER — Encounter: Payer: Self-pay | Admitting: Family

## 2022-09-17 VITALS — BP 136/78 | HR 103 | Ht 72.0 in | Wt 284.0 lb

## 2022-09-17 DIAGNOSIS — Z0000 Encounter for general adult medical examination without abnormal findings: Secondary | ICD-10-CM

## 2022-09-17 DIAGNOSIS — R7309 Other abnormal glucose: Secondary | ICD-10-CM | POA: Diagnosis not present

## 2022-09-17 DIAGNOSIS — M109 Gout, unspecified: Secondary | ICD-10-CM

## 2022-09-17 DIAGNOSIS — I10 Essential (primary) hypertension: Secondary | ICD-10-CM

## 2022-09-17 DIAGNOSIS — Z13220 Encounter for screening for lipoid disorders: Secondary | ICD-10-CM

## 2022-09-17 DIAGNOSIS — G4719 Other hypersomnia: Secondary | ICD-10-CM
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
## 2022-09-25 ENCOUNTER — Other Ambulatory Visit: Payer: Self-pay | Admitting: Family

## 2022-09-25 DIAGNOSIS — R7989 Other specified abnormal findings of blood chemistry: Secondary | ICD-10-CM

## 2022-09-27 ENCOUNTER — Ambulatory Visit: Payer: 59 | Admitting: Primary Care

## 2022-09-27 ENCOUNTER — Encounter: Payer: Self-pay | Admitting: Primary Care

## 2022-09-27 VITALS — BP 116/78 | HR 102 | Temp 97.6°F | Ht 72.0 in | Wt 286.4 lb

## 2022-09-27 DIAGNOSIS — G4710 Hypersomnia, unspecified: Secondary | ICD-10-CM | POA: Diagnosis not present

## 2022-09-27 DIAGNOSIS — G4730 Sleep apnea, unspecified: Secondary | ICD-10-CM

## 2022-09-27 MED ORDER — MODAFINIL 100 MG PO TABS: 100.0000 mg | ORAL_TABLET | Freq: Every day | ORAL | 1 refills | Status: DC

## 2022-09-27 NOTE — Progress Notes (Signed)
@Patient  ID: Wayne Wise Feeling, male    DOB: [birth year], 42 y.o.   MRN: [id number]  Chief Complaint  Patient presents with   Follow-up    OSA on CPAP    Referring provider: Olive Bass,*  HPI: 42 year old male, never smoked.  Past medical history significant for hypertension, OSA, traumatic brain injury, left knee arthroscopy, carpal tunnel syndrome, recurrent urticaria, headache syndrome.  Previous [facility] pulmonary encounter:  07/10/2021 Patient presents today for sleep consult.  Former patient of Dr. Vassie Loll, last seen in office on 10/02/2015 for OSA.  Sleep study in April 2017 showed severe obstructive sleep apnea, AHI 77/hr. patient underwent Pap titration which showed optimal pressure of 13 cm H2O. Last download from July 2017 showed patient was on auto setting 5-15cm h20. He continues to wear CPAP. He received message on his machine stating motor at exceeded lifetime. Machines is still in working condition but 44 years old. He is compliant with CPAP every night, wearing on average 9 hours a night.   He can not sleep without using his CPAP. Without CPAP he does notice he stops breathing. Typical bedtime is 9 PM.  It takes him on average 20 minutes to fall asleep.  He wakes up 1-2 times a night.  He starts his day at 6:30 AM.  DME was [facility] in 2017. He gets supplies through [place].  He is a retired Health and safety inspector. Epworth 3.   Airview download 05/02/2017-05/31/2017 Usage 30/30 days (100%); 30 days (100%) greater than 4 hours Average usage 9 hours 36 minutes Pressure 5 to 15 cm H2O (10.3 cm H2O- 95%) Air leaks 3.8 L/min (95%) AHI 0.4  09/26/2021 Patient presents today for compliance check. He received new CPAP machine in June. He is 100% compliant with use last 30 days. Current pressure 5-15cm h20. He is sleeping well at night, getting average 9 hours a night. No residual daytime sleepiness. No issues with mask fit or pressure setting. Epworth score 3. DME company in [place].   Airview  download 08/26/21-09/24/21 30/30 days; 100% > 4 hours  Average usage 9 hours 42 mins Pressure 5-15cm h20 (11.2cm h20-95%) Airleaks 2.7L/min AHI 0.9    09/27/2022- Interim hx  Patient presents today for annual follow-up/OSA. He is 100% compliant with CPAP use, tells me that he can not sleep without wearing at night. Primary care referred to [facility] due to excessive daytime sleepiness. He is always tired. He needs to drink energy drinks just to function. Follows with urology, his testosterone level has normalized.   Airview download 08/27/2022 - 09/25/2022 Usage days 30/30 days (100%) greater than 4 hours Average usage 9 hours 45 minutes Pressure 5 to 15 cm H2O (11.2 cm H2O-95%) Air leaks 0.8 L/min (95%) AHI 0.6  Allergies  Allergen Reactions   Other Hives and Anaphylaxis    Beef,pork,bison,lamb,venison Mammal - Red Meat   Balsam    Bismuth Subnitrate    Castor Oil    Hydrocodone     Dizzy    Lanolin    Poison Ivy Extract    Sulfa Antibiotics Swelling and Other (See Comments)    Swelling of fingers    Immunization History  Administered Date(s) Administered   Influenza,inj,Quad PF,6+ Mos 12/29/2019, 12/17/2020   Influenza,inj,quad, With Preservative 11/06/2013, 10/12/2017   Influenza-Unspecified 01/01/2015   PFIZER(Purple Top)SARS-COV-2 Vaccination 09/21/2019, 10/17/2019   Tdap 04/27/2003, 08/19/2013    Past Medical History:  Diagnosis Date   Allergy to alpha-gal    Arthritis    Carpal tunnel syndrome  Bilateral   Chicken pox as child   Gout    Headache syndrome 07/01/2017   Headaches due to old head trauma    History of kidney stones    Hypertension    Migraines    Sleep apnea    cpap autoset starts at 5   TBI (traumatic brain injury) (HCC) 2011    Tobacco History: Social History   Tobacco Use  Smoking Status Never  Smokeless Tobacco Former   Types: Chew   Quit date: 2012   Counseling given: Not Answered   Outpatient Medications Prior to  Visit  Medication Sig Dispense Refill   allopurinol (ZYLOPRIM) 300 MG tablet TAKE ONE TABLET BY MOUTH ONE TIME DAILY 90 tablet 1   colchicine 0.6 MG tablet TAKE ONE TABLET BY MOUTH TWICE A DAY AS NEEDED 60 tablet 2   indomethacin (INDOCIN) 50 MG capsule Use tid prn for gout flare as directed 60 capsule 1   levocetirizine (XYZAL) 5 MG tablet TAKE ONE TABLET BY MOUTH ONE TIME DAILY AS NEEDED FOR ITCH AND HIVES 90 tablet 3   Potassium Citrate 15 MEQ (1620 MG) TBCR Take 1 tablet by mouth 2 (two) times daily.     valsartan-hydrochlorothiazide (DIOVAN-HCT) 160-25 MG tablet TAKE 1 TABLET BY MOUTH ONE TIME DAILY 90 tablet 3   EPINEPHrine (AUVI-Q) 0.3 mg/0.3 mL IJ SOAJ injection Use as directed for severe allergic reactions (Patient not taking: Reported on 09/27/2022) 2 each 1   EPINEPHrine 0.3 mg/0.3 mL IJ SOAJ injection epinephrine 0.3 mg/0.3 mL injection, auto-injector (Patient not taking: Reported on 09/27/2022) 1 each 1   famotidine (PEPCID) 20 MG tablet Take 1 tablet (20 mg total) by mouth 2 (two) times daily. (Patient not taking: Reported on 09/27/2022) 60 tablet 3   fluticasone (FLONASE) 50 MCG/ACT nasal spray Place 2 sprays into both nostrils daily. (Patient not taking: Reported on 09/27/2022) 16 g 2   montelukast (SINGULAIR) 10 MG tablet Take 1 tablet (10 mg total) by mouth at bedtime. (Patient not taking: Reported on 09/27/2022) 30 tablet 5   nitroGLYCERIN (NITRODUR - DOSED IN MG/24 HR) 0.2 mg/hr patch Use 1/2 patch daily for up to 24 hours (Patient not taking: Reported on 09/27/2022) 30 patch 0   UNABLE TO FIND Med Name: Mega D3 & MK-7 (Bone and Cardiovascular Support) (Patient not taking: Reported on 10/30/2021)     No facility-administered medications prior to visit.    Review of Systems  Review of Systems  Constitutional: Negative.   HENT: Negative.    Respiratory: Negative.    Cardiovascular: Negative.    Physical Exam  BP 116/78 (BP Location: Left Arm, Patient Position: Sitting, Cuff  Size: Normal)   Pulse (!) 102   Temp 97.6 F (36.4 C) (Temporal)   Ht 6' (1.829 m)   Wt 286 lb 6.4 oz (129.9 kg)   SpO2 97%   BMI 38.84 kg/m  Physical Exam Constitutional:      General: He is not in acute distress.    Appearance: Normal appearance. He is not ill-appearing.  HENT:     Head: Normocephalic and atraumatic.     Mouth/Throat:     Mouth: Mucous membranes are moist.     Pharynx: Oropharynx is clear.  Cardiovascular:     Rate and Rhythm: Normal rate and regular rhythm.  Pulmonary:     Effort: Pulmonary effort is normal.     Breath sounds: Normal breath sounds.  Musculoskeletal:        General: Normal range  of motion.  Skin:    General: Skin is warm and dry.  Neurological:     General: No focal deficit present.     Mental Status: He is alert and oriented to person, place, and time. Mental status is at baseline.  Psychiatric:        Mood and Affect: Mood normal.        Behavior: Behavior normal.        Thought Content: Thought content normal.        Judgment: Judgment normal.      Lab Results:  CBC    Component Value Date/Time   WBC 7.9 09/17/2022 1355   RBC 5.30 09/17/2022 1355   HGB 16.6 09/17/2022 1355   HGB 15.2 03/26/2018 1511   HCT 50.7 09/17/2022 1355   HCT 44.8 03/26/2018 1511   PLT 252.0 09/17/2022 1355   PLT 254 03/26/2018 1511   MCV 95.6 09/17/2022 1355   MCV 93 03/26/2018 1511   MCH 32.0 10/30/2021 1608   MCHC 32.7 09/17/2022 1355   RDW 12.9 09/17/2022 1355   RDW 12.6 03/26/2018 1511   LYMPHSABS 2.2 09/17/2022 1355   MONOABS 0.7 09/17/2022 1355   EOSABS 0.2 09/17/2022 1355   BASOSABS 0.1 09/17/2022 1355    BMET    Component Value Date/Time   NA 139 09/17/2022 1355   NA 140 03/26/2018 1511   K 3.6 09/17/2022 1355   CL 98 09/17/2022 1355   CO2 30 09/17/2022 1355   GLUCOSE 92 09/17/2022 1355   BUN 17 09/17/2022 1355   BUN 19 03/26/2018 1511   CREATININE 0.98 09/17/2022 1355   CREATININE 1.47 (H) 04/30/2022 1138   CALCIUM 10.2  09/17/2022 1355   GFRNONAA 107 03/26/2018 1511   GFRAA 124 03/26/2018 1511    BNP No results found for: "BNP"  ProBNP No results found for: "PROBNP"  Imaging: No results found.   Assessment & Plan:   Hypersomnia with sleep apnea - Patient has severe obstructive sleep apnea, AHI 77/h. Maintained on auto CPAP 5 - 15 cm H2O; Residual AHI 0.6/hour.  Continues to have symptoms of excessive daytime sleepiness and hypersomnia despite CPAP compliance. Epworth 12 /24. We will trial patient on modafinil 100 mg daily. Advised to monitor BP/HR and notify if elevated. Reviewed potential side effects. He will notify office by MyChart in 1-2 weeks how he has responded to medication.  Advised patient continue to wear CPAP nightly for 6 to 8 hours or longer.  FU in 6 months or sooner if needed.   Glenford Bayley, NP 09/27/2022

## 2022-09-27 NOTE — Assessment & Plan Note (Addendum)
-   Patient has severe obstructive sleep apnea, AHI 77/h. Maintained on auto CPAP 5 - 15 cm H2O; Residual AHI 0.6/hour.  Continues to have symptoms of excessive daytime sleepiness and hypersomnia despite CPAP compliance. Epworth 12 /24. We will trial patient on modafinil 100 mg daily. Advised to monitor BP/HR and notify if elevated. Reviewed potential side effects. He will notify office by MyChart in 1-2 weeks how he has responded to medication.  Advised patient continue to wear CPAP nightly for 6 to 8 hours or longer.  FU in 6 months or sooner if needed.

## 2022-09-27 NOTE — Patient Instructions (Addendum)
Recommendations: Continue to wear CPAP nightly 6-8 hours  Start modafinil 100mg  daily in the morning (alcohol can increase side effect, use with caution) If you develop rash, mood changed or increased BP/HR notify office   Follow-up: 6 months with Beth NP     Modafinil Tablets What is this medication? MODAFINIL (moe DAF i nil) treats sleep disorders, such as narcolepsy, obstructive sleep apnea, and shift work disorder. It works by promoting wakefulness. It belongs to a group of medications called stimulants. This medicine may be used for other purposes; ask your health care provider or pharmacist if you have questions. COMMON BRAND NAME(S): Provigil What should I tell my care team before I take this medication? They need to know if you have any of these conditions: Kidney disease Liver disease Mental health conditions An unusual or allergic reaction to modafinil, other medications, foods, dyes, or preservatives Pregnant or trying to get pregnant Breast-feeding How should I use this medication? Take this medication by mouth with water. Take it as directed on the prescription label at the same time every day. You can take it with or without food. If it upsets your stomach, take it with food. Keep taking it unless your care team tells you to stop. A special MedGuide will be given to you by the pharmacist with each prescription and refill. Be sure to read this information carefully each time. Talk to your care team about the use of this medication in children. Special care may be needed. Overdosage: If you think you have taken too much of this medicine contact a poison control center or emergency room at once. NOTE: This medicine is only for you. Do not share this medicine with others. What if I miss a dose? If you miss a dose, take it as soon as you can. If it is almost time for your next dose, take only that dose. Do not take double or extra doses. What may interact with this  medication? Do not take this medication with any of the following: Amphetamine or dextroamphetamine Dexmethylphenidate or methylphenidate MAOIs, such as Marplan, Nardil, and Parnate Pemoline Procarbazine This medication may also interact with the following: Antifungal medications, such as itraconazole or ketoconazole Barbiturates, such as phenobarbital Carbamazepine Cyclosporine Diazepam Estrogen or progestin hormones Medications for mental health conditions Phenytoin Propranolol Triazolam Warfarin This list may not describe all possible interactions. Give your health care provider a list of all the medicines, herbs, non-prescription drugs, or dietary supplements you use. Also tell them if you smoke, drink alcohol, or use illegal drugs. Some items may interact with your medicine. What should I watch for while using this medication? Visit your care team for regular checks on your progress. It may be some time before you see the benefit from this medication. This medication may affect your coordination, reaction time, or judgment. Do not drive or operate machinery until you know how this medication affects you. Sit up or stand slowly to reduce the risk of dizzy or fainting spells. Drinking alcohol with this medication can increase the risk of these side effects. This medication may cause serious skin reactions. They can happen weeks to months after starting the medication. Contact your care team right away if you notice fevers or flu-like symptoms with a rash. The rash may be red or purple and then turn into blisters or peeling of the skin. You may also notice a red rash with swelling of the face, lips, or lymph nodes in your neck or under your arms. Estrogen  and progestin hormones may not work as well while you are taking this medication. Your care team can help you find the contraceptive option that works for you. It is unknown if the effects of this medication will be increased by the use  of caffeine. Caffeine is found in many foods, beverages, and medications. Ask your care team if you should limit or change your intake of caffeine-containing products while on this medication. What side effects may I notice from receiving this medication? Side effects that you should report to your care team as soon as possible: Allergic reactions or angioedema--skin rash, itching or hives, swelling of the face, eyes, lips, tongue, arms, or legs, trouble swallowing or breathing Increase in blood pressure Mood and behavior changes--anxiety, nervousness, confusion, hallucinations, irritability, hostility, thoughts of suicide or self-harm, worsening mood, feelings of depression Rash, fever, and swollen lymph nodes Redness, blistering, peeling, or loosening of the skin, including inside the mouth Side effects that usually do not require medical attention (report to your care team if they continue or are bothersome): Anxiety, nervousness Dizziness Headache Nausea Trouble sleeping This list may not describe all possible side effects. Call your doctor for medical advice about side effects. You may report side effects to FDA at 1-800-FDA-1088. Where should I keep my medication? Keep out of the reach of children and pets. This medication can be abused. Keep it in a safe place to protect it from theft. Do not share it with anyone. It is only for you. Selling or giving away this medication is dangerous and against the law. Store at room temperature between 20 and 25 degrees C (68 and 77 degrees F). Get rid of any unused medication after the expiration date. This medication may cause harm and death if it is taken by other adults, children, or pets. It is important to get rid of the medication as soon as you no longer need it or it is expired. You can do this in two ways: Take the medication to a medication take-back program. Check with your pharmacy or law enforcement to find a location. If you cannot return  the medication, check the label or package insert to see if the medication should be thrown out in the garbage or flushed down the toilet. If you are not sure, ask your care team. If it is safe to put it in the trash, take the medication out of the container. Mix the medication with cat litter, dirt, coffee grounds, or other unwanted substance. Seal the mixture in a bag or container. Put it in the trash. NOTE: This sheet is a summary. It may not cover all possible information. If you have questions about this medicine, talk to your doctor, pharmacist, or health care provider.  2024 Elsevier/Gold Standard (2022-03-08 00:00:00)

## 2022-10-01 ENCOUNTER — Ambulatory Visit
Admission: RE | Admit: 2022-10-01 | Discharge: 2022-10-01 | Disposition: A | Discharge status: Home / Self Care | Payer: 59 | Source: Ambulatory Visit | Attending: Family | Admitting: Family

## 2022-10-01 DIAGNOSIS — R7989 Other specified abnormal findings of blood chemistry: Secondary | ICD-10-CM

## 2022-10-02 ENCOUNTER — Encounter: Payer: Self-pay | Admitting: Family

## 2022-10-03 NOTE — Progress Notes (Signed)
Reviewed and agree with assessment/plan.   Coralyn Helling, MD [facility] Pulmonary/Critical Care 10/03/2022, 9:59 AM Pager:  [phone]

## 2022-10-04 ENCOUNTER — Other Ambulatory Visit: Payer: Self-pay | Admitting: Family

## 2022-10-04 DIAGNOSIS — R9389 Abnormal findings on diagnostic imaging of other specified body structures: Secondary | ICD-10-CM

## 2022-10-09 ENCOUNTER — Ambulatory Visit
Admission: RE | Admit: 2022-10-09 | Discharge: 2022-10-09 | Disposition: A | Discharge status: Home / Self Care | Payer: 59 | Source: Ambulatory Visit | Attending: Family | Admitting: Family

## 2022-10-09 DIAGNOSIS — R9389 Abnormal findings on diagnostic imaging of other specified body structures: Secondary | ICD-10-CM

## 2022-10-10 MED ORDER — MODAFINIL 200 MG PO TABS: 200.0000 mg | ORAL_TABLET | Freq: Every day | ORAL | 0 refills | Status: DC

## 2022-10-10 NOTE — Telephone Encounter (Signed)
Increase modafinil 200mg  daily- 2 week supply given. Let me know how this dose works. Hold on to the 100mg  tablets

## 2022-10-15 ENCOUNTER — Ambulatory Visit: Payer: 59 | Admitting: Internal Medicine

## 2022-10-23 ENCOUNTER — Institutional Professional Consult (permissible substitution): Payer: 59 | Admitting: Neurology

## 2022-10-30 NOTE — Addendum Note (Signed)
Addended by: Judd Gaudier on: 10/30/2022 04:06 PM   Modules accepted: Orders

## 2022-10-30 NOTE — Telephone Encounter (Signed)
Have him take 200mg  in the morning and 100mg  at 2pm / he should have enough for a 1-2 week trial

## 2022-10-31 MED ORDER — MODAFINIL 100 MG PO TABS: 100.0000 mg | ORAL_TABLET | Freq: Every day | ORAL | 2 refills | Status: DC

## 2022-10-31 MED ORDER — MODAFINIL 200 MG PO TABS: 200.0000 mg | ORAL_TABLET | Freq: Every day | ORAL | 0 refills | Status: DC

## 2022-10-31 NOTE — Telephone Encounter (Signed)
I'm out of 200mg  and have 12 of the 100mg  left   Beth, please advise. Thanks

## 2022-11-11 ENCOUNTER — Ambulatory Visit: Payer: 59 | Attending: Internal Medicine | Admitting: Internal Medicine

## 2022-11-11 ENCOUNTER — Encounter: Payer: Self-pay | Admitting: Internal Medicine

## 2022-11-11 VITALS — BP 130/81 | HR 62 | Resp 12 | Ht 72.0 in | Wt 277.0 lb

## 2022-11-11 DIAGNOSIS — M722 Plantar fascial fibromatosis: Secondary | ICD-10-CM | POA: Diagnosis not present

## 2022-11-11 DIAGNOSIS — M109 Gout, unspecified: Secondary | ICD-10-CM

## 2022-11-11 DIAGNOSIS — Z91018 Allergy to other foods: Secondary | ICD-10-CM

## 2022-11-11 DIAGNOSIS — Z5181 Encounter for therapeutic drug level monitoring: Secondary | ICD-10-CM | POA: Diagnosis not present

## 2022-11-11 MED ORDER — ALLOPURINOL 300 MG PO TABS
300.0000 mg | ORAL_TABLET | Freq: Every day | ORAL | 3 refills | Status: DC
Start: 2022-11-11 — End: 2023-11-10

## 2022-11-15 MED ORDER — MODAFINIL 200 MG PO TABS: 200.0000 mg | ORAL_TABLET | Freq: Two times a day (BID) | ORAL | 1 refills | Status: DC

## 2022-11-15 NOTE — Telephone Encounter (Signed)
Max dose is 200 mg twice a day.

## 2022-11-15 NOTE — Telephone Encounter (Signed)
Mychart message sent by pt:  [facility] (supporting Glenford Bayley, NP)7 hours ago (7:12 AM)    Wayne Wise, I haven't really been able to tell a difference in energy at all since I've added another 100mg  in the afternoon on top of the 200mg  in the morning. I think I was just hoping it would help so much that I thought it might be helping, but it's not doing much.    Wayne Wise, please advise.

## 2022-12-03 MED ORDER — ARMODAFINIL 200 MG PO TABS: 1.0000 | ORAL_TABLET | Freq: Every day | ORAL | 0 refills | Status: DC

## 2022-12-03 NOTE — Telephone Encounter (Signed)
I will change to a different medication called Nuvigil (Armodafinil) - take 200mg  first thing in the morning. The dosing is different on this medication, max dose is 250mg . Do not take with Modafinil.   If this does not help would schedule visit with either Dr. Maple Hudson or Dr. Wynona Neat to discuss other options for hypersomnia with OSA  Triage- please call pharmacy and cancel modafinil 200mg  prescription recently sent in

## 2022-12-03 NOTE — Addendum Note (Signed)
Addended by: Glenford Bayley on: 12/03/2022 09:23 AM   Modules accepted: Orders

## 2022-12-13 ENCOUNTER — Encounter: Payer: Self-pay | Admitting: Emergency Medicine

## 2022-12-13 ENCOUNTER — Ambulatory Visit
Admission: EM | Admit: 2022-12-13 | Discharge: 2022-12-13 | Disposition: A | Discharge status: Home / Self Care | Payer: 59 | Attending: Family Medicine | Admitting: Family Medicine

## 2022-12-13 DIAGNOSIS — J0100 Acute maxillary sinusitis, unspecified: Secondary | ICD-10-CM | POA: Diagnosis not present

## 2022-12-13 DIAGNOSIS — J3489 Other specified disorders of nose and nasal sinuses: Secondary | ICD-10-CM

## 2022-12-13 MED ORDER — METHYLPREDNISOLONE SODIUM SUCC 125 MG IJ SOLR
125.0000 mg | Freq: Once | INTRAMUSCULAR | Status: AC
  Administered 2022-12-13: 125 mg via INTRAMUSCULAR

## 2022-12-13 MED ORDER — CEFDINIR 300 MG PO CAPS: 300.0000 mg | ORAL_CAPSULE | Freq: Two times a day (BID) | ORAL | 0 refills | Status: AC

## 2022-12-13 NOTE — ED Triage Notes (Signed)
Patient c/o possible sinus pressure and pain x 3 days, nasal drainage.  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  Headache, top of throat is sore.  Patient has taken Theraflu and Tylenol for sx's.

## 2022-12-13 NOTE — Discharge Instructions (Addendum)
Patient to take medication as directed with food to completion.  Encouraged increase daily water intake to 64 ounces per day while taking this medication.  Advised if symptoms worsen and/or unresolved please follow-up with PCP or here for further evaluation.
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
## 2022-12-20 ENCOUNTER — Encounter: Payer: Self-pay | Admitting: Internal Medicine

## 2022-12-24 MED ORDER — ARMODAFINIL 200 MG PO TABS: 1.0000 | ORAL_TABLET | Freq: Every day | ORAL | 0 refills | Status: DC

## 2022-12-24 NOTE — Telephone Encounter (Signed)
Lets continue on Armodafinil (Nuvigil) 200mg  daily for another month and see how he is doing, ill send in refill

## 2023-01-23 NOTE — Telephone Encounter (Signed)
Well continue medication because it seems to be helping some. Continue to work on getting 6-8 hours of sleep a night, get sunlight and exercise daily. Stay well hydrated. Check with primary care regarding other causes of fatigue- iron studies, b12, testosterone level etc

## 2023-01-28 ENCOUNTER — Other Ambulatory Visit: Payer: Self-pay | Admitting: Primary Care

## 2023-01-31 ENCOUNTER — Telehealth: Payer: Self-pay | Admitting: Primary Care

## 2023-01-31 MED ORDER — ARMODAFINIL 200 MG PO TABS: 1.0000 | ORAL_TABLET | Freq: Every day | ORAL | 3 refills | Status: DC

## 2023-01-31 NOTE — Telephone Encounter (Signed)
Armodafinil  needs refilling please. States he has tried to get this about 12/12 and has sent [employer]. See encounter from 12/12 from [facility]. He can not continue on meds he does not have, he said. He is out now.   Pharm is [facility] at [employer] ([address])

## 2023-01-31 NOTE — Telephone Encounter (Signed)
Sending Rx, I recommend he continue. Should have follow up if symptoms are not better

## 2023-01-31 NOTE — Telephone Encounter (Signed)
I called the pt and left detailed msg letting him know that this was refilled

## 2023-03-31 ENCOUNTER — Ambulatory Visit: Payer: 59 | Admitting: Primary Care

## 2023-04-01 ENCOUNTER — Encounter: Payer: Self-pay | Admitting: Primary Care

## 2023-04-01 ENCOUNTER — Ambulatory Visit: Payer: 59 | Admitting: Primary Care

## 2023-04-01 VITALS — BP 141/84 | HR 107 | Temp 97.1°F | Ht 72.0 in | Wt 285.4 lb

## 2023-04-01 DIAGNOSIS — G4710 Hypersomnia, unspecified: Secondary | ICD-10-CM

## 2023-04-01 DIAGNOSIS — G4733 Obstructive sleep apnea (adult) (pediatric): Secondary | ICD-10-CM | POA: Diagnosis not present

## 2023-04-01 MED ORDER — AMPHETAMINE-DEXTROAMPHET ER 10 MG PO CP24: 10.0000 mg | ORAL_CAPSULE | Freq: Every day | ORAL | 0 refills | Status: DC

## 2023-04-01 NOTE — Progress Notes (Signed)
 @Patient  ID: Alla Feeling, male    DOB: 1980/02/24, 43 y.o.   MRN: [id number]  Chief Complaint  Patient presents with   Follow-up    Referring provider: Olive Bass,*  HPI: 43 year old male, never smoked.  Past medical history significant for hypertension, OSA, traumatic brain injury, left knee arthroscopy, carpal tunnel syndrome, recurrent urticaria, headache syndrome.  Previous [facility] pulmonary encounter:  07/10/2021 Patient presents today for sleep consult.  Former patient of Dr. Vassie Loll, last seen in office on 10/02/2015 for OSA.  Sleep study in April 2017 showed severe obstructive sleep apnea, AHI 77/hr. patient underwent Pap titration which showed optimal pressure of 13 cm H2O. Last download from July 2017 showed patient was on auto setting 5-15cm h20. He continues to wear CPAP. He received message on his machine stating motor at exceeded lifetime. Machines is still in working condition but 45 years old. He is compliant with CPAP every night, wearing on average 9 hours a night.   He can not sleep without using his CPAP. Without CPAP he does notice he stops breathing. Typical bedtime is 9 PM.  It takes him on average 20 minutes to fall asleep.  He wakes up 1-2 times a night.  He starts his day at 6:30 AM.  DME was [facility] in 2017. He gets supplies through [employer].  He is a retired Health and safety inspector. Epworth 3.   Airview download 05/02/2017-05/31/2017 Usage 30/30 days (100%); 30 days (100%) greater than 4 hours Average usage 9 hours 36 minutes Pressure 5 to 15 cm H2O (10.3 cm H2O- 95%) Air leaks 3.8 L/min (95%) AHI 0.4  09/26/2021 Patient presents today for compliance check. He received new CPAP machine in June. He is 100% compliant with use last 30 days. Current pressure 5-15cm h20. He is sleeping well at night, getting average 9 hours a night. No residual daytime sleepiness. No issues with mask fit or pressure setting. Epworth score 3. DME company in [place].   Airview download  08/26/21-09/24/21 30/30 days; 100% > 4 hours  Average usage 9 hours 42 mins Pressure 5-15cm h20 (11.2cm h20-95%) Airleaks 2.7L/min AHI 0.9    04/01/2023 Patient presents today for annual follow-up/OSA. He is 100% compliant with CPAP use, tells me that he can not sleep without wearing at night. Primary care referred to [facility] due to excessive daytime sleepiness. He is always tired. He needs to drink energy drinks just to function. Follows with urology, his testosterone level has normalized.   Airview download 08/27/2022 - 09/25/2022 Usage days 30/30 days (100%) greater than 4 hours Average usage 9 hours 45 minutes Pressure 5 to 15 cm H2O (11.2 cm H2O-95%) Air leaks 0.8 L/min (95%) AHI 0.6  04/01/2023- Interim hx  Discussed the use of AI scribe software for clinical note transcription with the patient, who gave verbal consent to proceed.  History of Present Illness   BRADON FESTER is a 43 year old male with severe sleep apnea who presents for follow-up regarding CPAP use and persistent fatigue.  He was diagnosed with severe sleep apnea in 2017, with 77 apneic events per hour. He uses a CPAP machine with auto settings (minimum pressure 5, maximum pressure 15) and has been 100% compliant over the last 30 days, averaging 9 hours of use per night. His current apnea score is 0.6, indicating well-controlled sleep apnea. He has no issues tolerating the CPAP and cannot sleep without it due to cessation of breathing if he dozes off without it.  Despite effective management  of sleep apnea, he experiences persistent fatigue. He discontinued armodafinil two weeks ago due to cost and lack of efficacy, with no change in fatigue levels since stopping. Previously, he was switched from modafinil to armodafinil, but neither improved his daytime sleepiness. He is a very light sleeper, waking up multiple times during the night, although he does not have trouble falling asleep. He typically goes to bed at 9  PM and starts his day around 6 or 6:30 AM, waking up one to two times to get out of bed but experiencing frequent awakenings throughout the night.  He has a history of testosterone replacement therapy, which he discontinued after achieving normal levels with high doses, as he did not notice any improvement in energy or fatigue. No current use of testosterone.  In terms of caffeine intake, he consumes a cup of coffee in the morning and sometimes tea. He occasionally uses energy drinks when he needs to stay awake for work. His Epworth Sleepiness Scale score is 12, indicating moderate daytime sleepiness despite consistent CPAP use. He reports a moderate chance of dozing off while sitting and reading or watching TV, and a high chance when lying down in the afternoon.      Airview download 03/01/23-03/30/23 Usage 30/30 days  Average usage 9 hours 1 min Pressure 5-15cm h20 Airleaks 0.6L/min (95%) AHI 0.6   Allergies  Allergen Reactions   Other Hives and Anaphylaxis    Beef,pork,bison,lamb,venison Mammal - Red Meat   Balsam    Bismuth Subnitrate    Castor Oil    Hydrocodone     Dizzy    Lanolin    Poison Ivy Extract    Sulfa Antibiotics Swelling and Other (See Comments)    Swelling of fingers    Immunization History  Administered Date(s) Administered   Influenza,inj,Quad PF,6+ Mos 12/29/2019, 12/17/2020   Influenza,inj,quad, With Preservative 11/06/2013, 10/12/2017   Influenza-Unspecified 01/01/2015   PFIZER(Purple Top)SARS-COV-2 Vaccination 09/21/2019, 10/17/2019   Tdap 04/27/2003, 08/19/2013    Past Medical History:  Diagnosis Date   Allergy to alpha-gal    Arthritis    Carpal tunnel syndrome    Bilateral   Chicken pox as child   Gout    Headache syndrome 07/01/2017   Headaches due to old head trauma    History of kidney stones    Hypertension    Migraines    Sleep apnea    cpap autoset starts at 3   TBI (traumatic brain injury) (HCC) 2011    Tobacco History: Social  History   Tobacco Use  Smoking Status Never   Passive exposure: Never  Smokeless Tobacco Former   Types: Chew   Quit date: 2012   Counseling given: Not Answered   Outpatient Medications Prior to Visit  Medication Sig Dispense Refill   allopurinol (ZYLOPRIM) 300 MG tablet Take 1 tablet (300 mg total) by mouth daily. 90 tablet 3   EPINEPHrine (AUVI-Q) 0.3 mg/0.3 mL IJ SOAJ injection Use as directed for severe allergic reactions 2 each 1   EPINEPHrine 0.3 mg/0.3 mL IJ SOAJ injection epinephrine 0.3 mg/0.3 mL injection, auto-injector 1 each 1   famotidine (PEPCID) 20 MG tablet Take 1 tablet (20 mg total) by mouth 2 (two) times daily. 60 tablet 3   indomethacin (INDOCIN) 50 MG capsule Use tid prn for gout flare as directed 60 capsule 1   levocetirizine (XYZAL) 5 MG tablet TAKE ONE TABLET BY MOUTH ONE TIME DAILY AS NEEDED FOR ITCH AND HIVES 90 tablet 3  Potassium Citrate 15 MEQ (1620 MG) TBCR Take 1 tablet by mouth 2 (two) times daily.     valsartan-hydrochlorothiazide (DIOVAN-HCT) 160-25 MG tablet TAKE 1 TABLET BY MOUTH ONE TIME DAILY 90 tablet 3   Armodafinil 200 MG TABS Take 1 tablet (200 mg total) by mouth daily. (Patient not taking: Reported on 04/01/2023) 30 tablet 3   fluticasone (FLONASE) 50 MCG/ACT nasal spray Place 2 sprays into both nostrils daily. (Patient not taking: Reported on 04/01/2023) 16 g 2   montelukast (SINGULAIR) 10 MG tablet Take 1 tablet (10 mg total) by mouth at bedtime. (Patient not taking: Reported on 04/01/2023) 30 tablet 5   nitroGLYCERIN (NITRODUR - DOSED IN MG/24 HR) 0.2 mg/hr patch Use 1/2 patch daily for up to 24 hours (Patient not taking: Reported on 04/01/2023) 30 patch 0   UNABLE TO FIND Med Name: Mega D3 & MK-7 (Bone and Cardiovascular Support) (Patient not taking: Reported on 04/01/2023)     No facility-administered medications prior to visit.    Review of Systems  Review of Systems  Constitutional:  Positive for fatigue.  HENT: Negative.     Respiratory: Negative.    Cardiovascular: Negative.    Physical Exam  BP (!) 141/84 (BP Location: Left Arm, Patient Position: Sitting, Cuff Size: Large)   Pulse (!) 107   Temp (!) 97.1 F (36.2 C) (Temporal)   Ht 6' (1.829 m)   Wt 285 lb 6.4 oz (129.5 kg)   SpO2 97%   BMI 38.71 kg/m  Physical Exam Constitutional:      General: He is not in acute distress.    Appearance: Normal appearance. He is not ill-appearing.  HENT:     Head: Normocephalic and atraumatic.     Mouth/Throat:     Mouth: Mucous membranes are moist.     Pharynx: Oropharynx is clear.  Cardiovascular:     Rate and Rhythm: Normal rate and regular rhythm.  Pulmonary:     Effort: Pulmonary effort is normal.     Breath sounds: Normal breath sounds. No wheezing or rhonchi.  Musculoskeletal:        General: Normal range of motion.  Skin:    General: Skin is warm and dry.  Neurological:     General: No focal deficit present.     Mental Status: He is alert and oriented to person, place, and time. Mental status is at baseline.  Psychiatric:        Mood and Affect: Mood normal.        Behavior: Behavior normal.        Thought Content: Thought content normal.        Judgment: Judgment normal.      Lab Results:  CBC    Component Value Date/Time   WBC 7.9 09/17/2022 1355   RBC 5.30 09/17/2022 1355   HGB 16.6 09/17/2022 1355   HGB 15.2 03/26/2018 1511   HCT 50.7 09/17/2022 1355   HCT 44.8 03/26/2018 1511   PLT 252.0 09/17/2022 1355   PLT 254 03/26/2018 1511   MCV 95.6 09/17/2022 1355   MCV 93 03/26/2018 1511   MCH 32.0 10/30/2021 1608   MCHC 32.7 09/17/2022 1355   RDW 12.9 09/17/2022 1355   RDW 12.6 03/26/2018 1511   LYMPHSABS 2.2 09/17/2022 1355   MONOABS 0.7 09/17/2022 1355   EOSABS 0.2 09/17/2022 1355   BASOSABS 0.1 09/17/2022 1355    BMET    Component Value Date/Time   NA 139 09/17/2022 1355   NA 140  03/26/2018 1511   K 3.6 09/17/2022 1355   CL 98 09/17/2022 1355   CO2 30 09/17/2022  1355   GLUCOSE 92 09/17/2022 1355   BUN 17 09/17/2022 1355   BUN 19 03/26/2018 1511   CREATININE 0.98 09/17/2022 1355   CREATININE 1.47 (H) 04/30/2022 1138   CALCIUM 10.2 09/17/2022 1355   GFRNONAA 107 03/26/2018 1511   GFRAA 124 03/26/2018 1511    BNP No results found for: "BNP"  ProBNP No results found for: "PROBNP"  Imaging: No results found.   Assessment & Plan:   1. Hypersomnia with sleep apnea (Primary)  2. OSA (obstructive sleep apnea)     Obstructive Sleep Apnea (OSA) with hypersomnia  Patient has severe OSA which is well controlled with auto CPAP. 100% compliant with use, average use of 9 hours per night, and apnea score of 0.6/hour. No significant breakthrough or residual apneas. However, patient reports multiple awakenings at night and persistent daytime fatigue. -Continue current CPAP settings (auto set, minimum pressure 5, maximum pressure 15). -Consider trial of Adderall XR for persistent daytime fatigue. Start with a low dose and monitor for efficacy and side effects.  Hypogonadism Previously on testosterone replacement therapy, but discontinued due to high doses required to achieve normal testosterone levels and lack of symptomatic improvement. -No current plan, as testosterone replacement therapy was not beneficial and is no longer being used.  Follow-up Patient to contact provider via MyChart or phone call to report on the efficacy of Adderall for managing daytime fatigue.      Glenford Bayley, NP 04/01/2023

## 2023-04-01 NOTE — Patient Instructions (Signed)
-  OBSTRUCTIVE SLEEP APNEA (OSA): Obstructive Sleep Apnea is a condition where your breathing stops and starts repeatedly during sleep. Your sleep apnea is well-controlled with your CPAP machine, which you are using consistently. We will continue with your current CPAP settings. To address your persistent daytime fatigue, we will try a low dose of Adderall and monitor its effectiveness and any side effects.  INSTRUCTIONS:  Please contact us via MyChart or phone call to report on how well Adderall is working for your daytime fatigue and if you experience any side effects.  Follow-up Virtual visit 8 weeks for hypersomnia follow-up

## 2023-04-08 MED ORDER — AMPHETAMINE-DEXTROAMPHET ER 20 MG PO CP24
20.0000 mg | ORAL_CAPSULE | Freq: Every day | ORAL | 0 refills | Status: DC
Start: 2023-04-08 — End: 2023-05-02

## 2023-04-08 NOTE — Telephone Encounter (Signed)
 I will increase dose to 20mg  extended release

## 2023-04-14 ENCOUNTER — Telehealth: Payer: Self-pay

## 2023-04-14 ENCOUNTER — Ambulatory Visit: Admitting: Internal Medicine

## 2023-04-14 ENCOUNTER — Ambulatory Visit: Payer: Self-pay | Admitting: Family

## 2023-04-14 ENCOUNTER — Encounter: Payer: Self-pay | Admitting: Internal Medicine

## 2023-04-14 VITALS — BP 120/70 | HR 88 | Ht 72.0 in | Wt 285.0 lb

## 2023-04-14 DIAGNOSIS — H5500 Unspecified nystagmus: Secondary | ICD-10-CM

## 2023-04-14 DIAGNOSIS — I10 Essential (primary) hypertension: Secondary | ICD-10-CM

## 2023-04-14 DIAGNOSIS — H8111 Benign paroxysmal vertigo, right ear: Secondary | ICD-10-CM

## 2023-04-14 DIAGNOSIS — Z87820 Personal history of traumatic brain injury: Secondary | ICD-10-CM | POA: Diagnosis not present

## 2023-04-14 MED ORDER — ALPRAZOLAM 0.5 MG PO TABS: 0.5000 mg | ORAL_TABLET | Freq: Three times a day (TID) | ORAL | 0 refills | Status: DC | PRN

## 2023-04-14 NOTE — Telephone Encounter (Signed)
 Chief Complaint: vertigo Symptoms: dizziness (room feels like it is spinning), slight headache last night Frequency: started yesterday morning when he woke up Pertinent Negatives: Patient denies LOC, nausea, vomiting, earaches, new weakness or numbness, changes in speech or vision, Disposition: [] ED /[] Urgent Care (no appt availability in office) / [x] Appointment(In office/virtual)/ []  [facility]/ [] Home Care/ [] Refused Recommended Disposition /[] [facility]/ []  Follow-up with PCP Additional Notes: Patient reached out earlier today to the office asking if he can be seen by his PCP for his vertigo or if it is better to be seen by his neurologist. Patient calling back as he has not heard back from the office. Informed patient the message was sent to his PCP earlier. Patient seen at urgent care yesterday and given scopolamine and meclizine. Patient states it improved the symptoms from last night but did not resolve it. He states back in 2012 he had vertigo from his TBI and was seeing physical therapy for it. Patient offered appointment tomorrow with his PCP and states he would prefer to be seen today. Appointment made with available provider/location  Copied from CRM [id number]. Topic: Clinical - Medical Advice >> Apr 14, 2023  8:08 AM Prudencio Pair wrote: Reason for CRM: Patient states he's had vertigo for two days. Says he's also had a TBI injury several years ago & also sees a neurologist. Patient wants to know if Dr. Dayton Scrape will need to see him or should he just go see his neurologist. States he doesn't want to have to pay a copay and then he's told to go see his neurologist. Please give patient a call back to advise. CB #: [phone]. Reason for Disposition  [1] MODERATE dizziness (e.g., vertigo; feels very unsteady, interferes with normal activities) AND [2] has been evaluated by doctor (or NP/PA) for this  Answer Assessment - Initial Assessment Questions 1. DESCRIPTION:  "Describe your dizziness."     "The room is spinning, I'm stumbling around if I try to walk". Patient states he went to urgent care yesterday. They did an EKG and said it was good. He was told it was something in his head.  2. VERTIGO: "Do you feel like either you or the room is spinning or tilting?"      Yes.  3. LIGHTHEADED: "Do you feel lightheaded?" (e.g., somewhat faint, woozy, weak upon standing)     Yes.  4. SEVERITY: "How bad is it?"  "Can you walk?"   - MILD: Feels slightly dizzy and unsteady, but is walking normally.   - MODERATE: Feels unsteady when walking, but not falling; interferes with normal activities (e.g., school, work).   - SEVERE: Unable to walk without falling, or requires assistance to walk without falling.     Patient states it goes between moderate and severe. He feels like he is stumbling when he is walking, worse when lying down then standing up or bending over.  5. ONSET:  "When did the dizziness begin?"     Yesterday morning.  6. AGGRAVATING FACTORS: "Does anything make it worse?" (e.g., standing, change in head position)     Bending over then standing up, changing positions from lying down to standing up.  7. CAUSE: "What do you think is causing the dizziness?"     Patient states he thought it was a side effect of medications but urgent care PA told him it was not; told most likely it is related to his TBI/vertigo. Patient states he had held his medications on Saturday from being  busy so the PA thought it was not related to medications since he had been off them for about a day-day and a half.  8. RECURRENT SYMPTOM: "Have you had dizziness before?" If Yes, ask: "When was the last time?" "What happened that time?"     Patient states in the past he had vertigo from his TBI, last time he had it was mid 2012. He states he was treating with lying on a table and rolling from side to side at the physical therapist.  9. OTHER SYMPTOMS: "Do you have any other  symptoms?" (e.g., headache, weakness, numbness, vomiting, earache)     Slight headache last night.  Protocols used: Dizziness - Vertigo-A-AH

## 2023-04-14 NOTE — Telephone Encounter (Signed)
 Copied from CRM [id number]. Topic: Clinical - Medical Advice >> Apr 14, 2023  8:08 AM Prudencio Pair wrote: Reason for CRM: Patient states he's had vertigo for two days. Says he's also had a TBI injury several years ago & also sees a neurologist. Patient wants to know if Dr. Dayton Scrape will need to see him or should he just go see his neurologist. States he doesn't want to have to pay a copay and then he's told to go see his neurologist. Please give patient a call back to advise. CB #: [phone].
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
## 2023-04-15 NOTE — Telephone Encounter (Signed)
 Spoke with pt, pt states he was seen by a different provider 04/14/2023 and was advised "after getting vertigo once, it will always come back and could last around 10 days". Pt was prescribed Xanax and was told to follow up in 10 days. Pt states if he is not feeling better after the 10 days he will follow up with Neurologist.

## 2023-04-15 NOTE — Patient Instructions (Addendum)
 We are sorry you are not feeling well today.  You have been diagnosed with acute vertigo.  You exhibit nystagmus on exam.  You can expect symptoms to be present for a week or so I would think.  We have prescribed Xanax to take 0.5 mg up to 3 times daily as needed.  Rest at home and stay well-hydrated.  Be careful about falling.  You may discontinue scopolamine patch and meclizine.  Call if not improving in 48 hours or sooner if worse.  Also we noticed your triglyceride levels have been elevated on lab testing.  Please consider taking statin medication to lower your risk of cardiovascular disease.

## 2023-04-21 NOTE — Telephone Encounter (Signed)
 Make a virtual visit with me this week to discuss sleep, Continue adderall for now. We have tried multiple medications to help with hypersomnia without improvement. May consider sleep aid

## 2023-04-23 ENCOUNTER — Telehealth: Payer: Self-pay | Admitting: Primary Care

## 2023-04-23 NOTE — Telephone Encounter (Signed)
 Beth, okay to double book if you are not already?

## 2023-04-23 NOTE — Telephone Encounter (Signed)
 Based on the last "Patient Message" Ms. Clent Ridges wrote the following:  Make a virtual visit with me this week to discuss sleep, Continue adderall for now. We have tried multiple medications to help with hypersomnia without improvement. May consider sleep aid       I let the PT know when he called in this morning but  have no openings for a visit. Please ask Ms. Clent Ridges if I can double book him for a [employer] video visit and fwd to the [employer] for scheduling this week. TY.

## 2023-04-23 NOTE — Telephone Encounter (Signed)
 Yes

## 2023-04-24 NOTE — Telephone Encounter (Signed)
 Done NFN

## 2023-04-25 ENCOUNTER — Telehealth (INDEPENDENT_AMBULATORY_CARE_PROVIDER_SITE_OTHER): Admitting: Primary Care

## 2023-04-25 DIAGNOSIS — G4710 Hypersomnia, unspecified: Secondary | ICD-10-CM

## 2023-04-25 DIAGNOSIS — H81399 Other peripheral vertigo, unspecified ear: Secondary | ICD-10-CM

## 2023-04-25 DIAGNOSIS — G4730 Sleep apnea, unspecified: Secondary | ICD-10-CM | POA: Diagnosis not present

## 2023-04-25 DIAGNOSIS — Z87820 Personal history of traumatic brain injury: Secondary | ICD-10-CM

## 2023-04-25 DIAGNOSIS — G4733 Obstructive sleep apnea (adult) (pediatric): Secondary | ICD-10-CM | POA: Diagnosis not present

## 2023-04-25 NOTE — Progress Notes (Signed)
 Virtual Visit via Video Note  I connected with Wayne Wise on 04/25/23 at  2:00 PM EDT by a video enabled telemedicine application and verified that I am speaking with the correct person using two identifiers.  Location: Patient: Home Provider: Office   I discussed the limitations of evaluation and management by telemedicine and the availability of in person appointments. The patient expressed understanding and agreed to proceed.  History of Present Illness:  43 year old male, never smoked.  Past medical history significant for hypertension, OSA, traumatic brain injury, left knee arthroscopy, carpal tunnel syndrome, recurrent urticaria, headache syndrome.  Previous [facility] pulmonary encounter:  07/10/2021 Patient presents today for sleep consult.  Former patient of Dr. Villa Greaser, last seen in office on 10/02/2015 for OSA.  Sleep study in April 2017 showed severe obstructive sleep apnea, AHI 77/hr. patient underwent Pap titration which showed optimal pressure of 13 cm H2O. Last download from July 2017 showed patient was on auto setting 5-15cm h20. He continues to wear CPAP. He received message on his machine stating motor at exceeded lifetime. Machines is still in working condition but 45 years old. He is compliant with CPAP every night, wearing on average 9 hours a night.   He can not sleep without using his CPAP. Without CPAP he does notice he stops breathing. Typical bedtime is 9 PM.  It takes him on average 20 minutes to fall asleep.  He wakes up 1-2 times a night.  He starts his day at 6:30 AM.  DME was [facility] in 2017. He gets supplies through [facility].  He is a retired Health and safety inspector. Epworth 3.   Airview download 05/02/2017-05/31/2017 Usage 30/30 days (100%); 30 days (100%) greater than 4 hours Average usage 9 hours 36 minutes Pressure 5 to 15 cm H2O (10.3 cm H2O- 95%) Air leaks 3.8 L/min (95%) AHI 0.4  09/26/2021 Patient presents today for compliance check. He received new CPAP machine in June. He is  100% compliant with use last 30 days. Current pressure 5-15cm h20. He is sleeping well at night, getting average 9 hours a night. No residual daytime sleepiness. No issues with mask fit or pressure setting. Epworth score 3. DME company in [place].   Airview download 08/26/21-09/24/21 30/30 days; 100% > 4 hours  Average usage 9 hours 42 mins Pressure 5-15cm h20 (11.2cm h20-95%) Airleaks 2.7L/min AHI 0.9    04/01/2023 Patient presents today for annual follow-up/OSA. He is 100% compliant with CPAP use, tells me that he can not sleep without wearing at night. Primary care referred to [facility] due to excessive daytime sleepiness. He is always tired. He needs to drink energy drinks just to function. Follows with urology, his testosterone level has normalized.   Airview download 08/27/2022 - 09/25/2022 Usage days 30/30 days (100%) greater than 4 hours Average usage 9 hours 45 minutes Pressure 5 to 15 cm H2O (11.2 cm H2O-95%) Air leaks 0.8 L/min (95%) AHI 0.6  04/01/2023 Discussed the use of AI scribe software for clinical note transcription with the patient, who gave verbal consent to proceed.  History of Present Illness   Wayne Wise is a 43 year old male with severe sleep apnea who presents for follow-up regarding CPAP use and persistent fatigue.  He was diagnosed with severe sleep apnea in 2017, with 77 apneic events per hour. He uses a CPAP machine with auto settings (minimum pressure 5, maximum pressure 15) and has been 100% compliant over the last 30 days, averaging 9 hours of use per night. His  current apnea score is 0.6, indicating well-controlled sleep apnea. He has no issues tolerating the CPAP and cannot sleep without it due to cessation of breathing if he dozes off without it.  Despite effective management of sleep apnea, he experiences persistent fatigue. He discontinued armodafinil two weeks ago due to cost and lack of efficacy, with no change in fatigue levels since stopping.  Previously, he was switched from modafinil to armodafinil, but neither improved his daytime sleepiness. He is a very light sleeper, waking up multiple times during the night, although he does not have trouble falling asleep. He typically goes to bed at 9 PM and starts his day around 6 or 6:30 AM, waking up one to two times to get out of bed but experiencing frequent awakenings throughout the night.  He has a history of testosterone replacement therapy, which he discontinued after achieving normal levels with high doses, as he did not notice any improvement in energy or fatigue. No current use of testosterone.  In terms of caffeine intake, he consumes a cup of coffee in the morning and sometimes tea. He occasionally uses energy drinks when he needs to stay awake for work. His Epworth Sleepiness Scale score is 12, indicating moderate daytime sleepiness despite consistent CPAP use. He reports a moderate chance of dozing off while sitting and reading or watching TV, and a high chance when lying down in the afternoon.      Airview download 03/01/23-03/30/23 Usage 30/30 days  Average usage 9 hours 1 min Pressure 5-15cm h20 Airleaks 0.6L/min (95%) AHI 0.6    04/25/2023 - interim hx  Discussed the use of AI scribe software for clinical note transcription with the patient, who gave verbal consent to proceed.  History of Present Illness   Wayne Wise is a 43 year old male with traumatic brain injury and sleep apnea who presents with vertigo and hypersomnia.  He has a history of sleep apnea diagnosed in 2017, initially presenting with severe symptoms of 77 apneic events per hour. He uses a CPAP machine consistently every night for an average of nine hours, with a current apnea-hypopnea index (AHI) of 0.6. Despite this, he experiences significant daytime fatigue and hypersomnia. He does not snore or gasp for air with the CPAP. He dreams regularly and does not experience choking or gasping upon waking. He wakes  frequently at night but can return to sleep easily.  He experiences significant daytime fatigue and hypersomnia, characterized by the ability to fall asleep easily during the day and a high likelihood of dozing off in situations such as lying down in the afternoon or sitting and talking to someone. He does not experience sudden sleep attacks. He has tried various medications for hypersomnia, including armodafinil and modafinil, without improvement. Currently, he is on Adderall 20 mg extended release in the morning, which has not improved his symptoms.  He reports experiencing vertigo that began approximately one to two weeks ago. He has a history of traumatic brain injury (TBI) from several years ago, and he was informed that vertigo can recur post-TBI. He had not experienced vertigo since 2012 until this recent episode. The vertigo lasted about four days and resolved with medication, but it recurred at 5:00 AM on the day of the visit. He questions whether his Adderall use could be related to the vertigo, but previous doctors did not think so. He has been on Adderall for about a month.  His current medications include allopurinol, levetirazine, potassium, and valsartan. He has an  EpiPen and uses famotidine. He was prescribed Xanax 0.5 mg for vertigo but finds it ineffective and has not been using it. 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     Observations/Objective:  Appears well without overt respiratory symptoms  Assessment and Plan:  1. Hypersomnia with sleep apnea (Primary)  2. OSA (obstructive sleep apnea)  Assessment and Plan    Hypersomnia Persistent hypersomnia despite CPAP and various stimulants. Epworth score 14. He has been tried on Armodafinil, Modafinil and Adderall with little to no improvement. He is currently taking Adderall ER 20mg  in the morning and has seen no changes. He has no  issues falling asleep but is a very light sleeper, wakes up easily to noise. When he does wake up he has no trouble falling back to sleep. He was on testosterone replacement, required high dose but also saw no improvement in fatigue. Sleep apnea is well-controlled on CPAP and patient is complaint with use.  - Discontinue Adderall; start Sunosi 75mg  by mouth daily   Sleep Apnea Severe sleep apnea well-controlled with CPAP, AHI 0.6. - Continue CPAP therapy with current settings.  Vertigo Recent vertigo episode resolved. History of TBI may contribute. No Adderall connection found. - Follow up with pharmacist regarding Adderall and vertigo.  Gout Gout managed with allopurinol. Avoids alcohol. - Continue allopurinol therapy.      Follow Up Instructions:   8-12 weeks  I discussed the assessment and treatment plan with the patient. The patient was provided an opportunity to ask questions and all were answered. The patient agreed with the plan and demonstrated an understanding of the instructions.   The patient was advised to call back or seek an in-person evaluation if the symptoms worsen or if the condition fails to improve as anticipated.  I provided 22 minutes of non-face-to-face time during this encounter.   Antonio Baumgarten, NP

## 2023-05-02 ENCOUNTER — Other Ambulatory Visit (HOSPITAL_COMMUNITY): Payer: Self-pay

## 2023-05-02 ENCOUNTER — Telehealth: Payer: Self-pay

## 2023-05-02 ENCOUNTER — Other Ambulatory Visit: Payer: Self-pay | Admitting: Primary Care

## 2023-05-02 MED ORDER — SUNOSI 75 MG PO TABS: 1.0000 | ORAL_TABLET | Freq: Every day | ORAL | 0 refills | Status: DC

## 2023-05-02 NOTE — Telephone Encounter (Signed)
 Pharmacy Patient Advocate Encounter  Received notification from [facility] that Prior Authorization for Sunosi has been APPROVED from 05/02/2023 to 05/01/2024. Unable to obtain price due to refill too soon rejection, last fill date 05/02/2023 next available fill date 05/29/2023

## 2023-05-02 NOTE — Telephone Encounter (Signed)
*  Pulm  Pharmacy Patient Advocate Encounter   Received notification from CoverMyMeds that prior authorization for Sunosi 75MG  tablets  is required/requested.   Insurance verification completed.   The patient is insured through [facility] .   Per test claim: PA required; PA submitted to above mentioned insurance via CoverMyMeds Key/confirmation #/EOC [id number] Status is pending

## 2023-05-02 NOTE — Progress Notes (Signed)
 Adderall XR 20mg  not helping with excessive daytime sleepiness  Recommend patient be tried on Sunosi 75mg  daily, option to increase to 150mg  if needed. Patient has tried armodafinil and modafinil.

## 2023-06-03 ENCOUNTER — Other Ambulatory Visit: Payer: Self-pay | Admitting: Primary Care

## 2023-06-03 ENCOUNTER — Telehealth: Payer: 59 | Admitting: Primary Care

## 2023-06-03 NOTE — Telephone Encounter (Signed)
**Note De-identified  [employer] Obfuscation** Please advise 

## 2023-07-09 ENCOUNTER — Other Ambulatory Visit: Payer: Self-pay | Admitting: Primary Care

## 2023-07-28 ENCOUNTER — Ambulatory Visit: Admitting: Primary Care

## 2023-08-13 ENCOUNTER — Other Ambulatory Visit: Payer: Self-pay | Admitting: Primary Care

## 2023-08-13 NOTE — Telephone Encounter (Signed)
 Pt is requesting med refill for Sunosi . Please advise [employer]

## 2023-09-12 ENCOUNTER — Telehealth (HOSPITAL_BASED_OUTPATIENT_CLINIC_OR_DEPARTMENT_OTHER): Admitting: Primary Care

## 2023-09-12 DIAGNOSIS — G4719 Other hypersomnia: Secondary | ICD-10-CM

## 2023-09-12 DIAGNOSIS — G4733 Obstructive sleep apnea (adult) (pediatric): Secondary | ICD-10-CM | POA: Diagnosis not present

## 2023-09-12 MED ORDER — SUNOSI 75 MG PO TABS: 1.0000 | ORAL_TABLET | Freq: Every day | ORAL | 3 refills | Status: DC

## 2023-09-12 NOTE — Progress Notes (Signed)
 Virtual Visit via Video Note  I connected with Wayne Wise on 09/12/23 at  1:30 PM EDT by a video enabled telemedicine application and verified that I am speaking with the correct person using two identifiers.  Location: Patient: Home Provider: Office   I discussed the limitations of evaluation and management by telemedicine and the availability of in person appointments. The patient expressed understanding and agreed to proceed.  History of Present Illness: 43 year old male, never smoked.  Past medical history significant for hypertension, OSA, traumatic brain injury, left knee arthroscopy, carpal tunnel syndrome, recurrent urticaria, headache syndrome.  Previous [facility] pulmonary encounter:  07/10/2021 Patient presents today for sleep consult.  Former patient of Dr. Jude, last seen in office on 10/02/2015 for OSA.  Sleep study in April 2017 showed severe obstructive sleep apnea, AHI 77/hr. patient underwent Pap titration which showed optimal pressure of 13 cm H2O. Last download from July 2017 showed patient was on auto setting 5-15cm h20. He continues to wear CPAP. He received message on his machine stating motor at exceeded lifetime. Machines is still in working condition but 45 years old. He is compliant with CPAP every night, wearing on average 9 hours a night.   He can not sleep without using his CPAP. Without CPAP he does notice he stops breathing. Typical bedtime is 9 PM.  It takes him on average 20 minutes to fall asleep.  He wakes up 1-2 times a night.  He starts his day at 6:30 AM.  DME was [facility] in 2017. He gets supplies through [employer].  He is a retired Health and safety inspector. Epworth 3.   Airview download 05/02/2017-05/31/2017 Usage 30/30 days (100%); 30 days (100%) greater than 4 hours Average usage 9 hours 36 minutes Pressure 5 to 15 cm H2O (10.3 cm H2O- 95%) Air leaks 3.8 L/min (95%) AHI 0.4  09/26/2021 Patient presents today for compliance check. He received new CPAP machine in June. He is 100%  compliant with use last 30 days. Current pressure 5-15cm h20. He is sleeping well at night, getting average 9 hours a night. No residual daytime sleepiness. No issues with mask fit or pressure setting. Epworth score 3. DME company in [place].   Airview download 08/26/21-09/24/21 30/30 days; 100% > 4 hours  Average usage 9 hours 42 mins Pressure 5-15cm h20 (11.2cm h20-95%) Airleaks 2.7L/min AHI 0.9     04/01/2023 Discussed the use of AI scribe software for clinical note transcription with the patient, who gave verbal consent to proceed.  History of Present Illness   Wayne Wise is a 43 year old male with severe sleep apnea who presents for follow-up regarding CPAP use and persistent fatigue.  He was diagnosed with severe sleep apnea in 2017, with 77 apneic events per hour. He uses a CPAP machine with auto settings (minimum pressure 5, maximum pressure 15) and has been 100% compliant over the last 30 days, averaging 9 hours of use per night. His current apnea score is 0.6, indicating well-controlled sleep apnea. He has no issues tolerating the CPAP and cannot sleep without it due to cessation of breathing if he dozes off without it.  Despite effective management of sleep apnea, he experiences persistent fatigue. He discontinued armodafinil  two weeks ago due to cost and lack of efficacy, with no change in fatigue levels since stopping. Previously, he was switched from modafinil  to armodafinil , but neither improved his daytime sleepiness. He is a very light sleeper, waking up multiple times during the night, although he does not have  trouble falling asleep. He typically goes to bed at 9 PM and starts his day around 6 or 6:30 AM, waking up one to two times to get out of bed but experiencing frequent awakenings throughout the night.  He has a history of testosterone  replacement therapy, which he discontinued after achieving normal levels with high doses, as he did not notice any improvement in energy or  fatigue. No current use of testosterone .  In terms of caffeine intake, he consumes a cup of coffee in the morning and sometimes tea. He occasionally uses energy drinks when he needs to stay awake for work. His Epworth Sleepiness Scale score is 12, indicating moderate daytime sleepiness despite consistent CPAP use. He reports a moderate chance of dozing off while sitting and reading or watching TV, and a high chance when lying down in the afternoon.      Airview download 03/01/23-03/30/23 Usage 30/30 days  Average usage 9 hours 1 min Pressure 5-15cm h20 Airleaks 0.6L/min (95%) AHI 0.6    04/25/2023  Discussed the use of AI scribe software for clinical note transcription with the patient, who gave verbal consent to proceed.  History of Present Illness   Wayne GARRETTE is a 43 year old male with traumatic brain injury and sleep apnea who presents with vertigo and hypersomnia.  He has a history of sleep apnea diagnosed in 2017, initially presenting with severe symptoms of 77 apneic events per hour. He uses a CPAP machine consistently every night for an average of nine hours, with a current apnea-hypopnea index (AHI) of 0.6. Despite this, he experiences significant daytime fatigue and hypersomnia. He does not snore or gasp for air with the CPAP. He dreams regularly and does not experience choking or gasping upon waking. He wakes frequently at night but can return to sleep easily.  He experiences significant daytime fatigue and hypersomnia, characterized by the ability to fall asleep easily during the day and a high likelihood of dozing off in situations such as lying down in the afternoon or sitting and talking to someone. He does not experience sudden sleep attacks. He has tried various medications for hypersomnia, including armodafinil  and modafinil , without improvement. Currently, he is on Adderall 20 mg extended release in the morning, which has not improved his symptoms.  He reports experiencing  vertigo that began approximately one to two weeks ago. He has a history of traumatic brain injury (TBI) from several years ago, and he was informed that vertigo can recur post-TBI. He had not experienced vertigo since 2012 until this recent episode. The vertigo lasted about four days and resolved with medication, but it recurred at 5:00 AM on the day of the visit. He questions whether his Adderall use could be related to the vertigo, but previous doctors did not think so. He has been on Adderall for about a month.  His current medications include allopurinol , levetirazine, potassium, and valsartan . He has an EpiPen  and uses famotidine . He was prescribed Xanax  0.5 mg for vertigo but finds it ineffective and has not been using it. 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    09/12/2023 Discussed the use of AI scribe software for clinical note transcription with the patient, who gave verbal consent to proceed.  History of Present Illness HARVIE MORUA is a 43 year old male with sleep apnea and hypersomnia who presents for a follow-up visit.  His sleep  pattern is erratic, with one night a week where he wakes up at 3 AM and cannot fall back asleep. He typically goes to bed around 9 PM and gets at least six hours of sleep. He does not have trouble falling asleep initially.  He is currently taking Sunosi  75 mg in the morning, which has been helpful for his hypersomnia. No symptoms of vertigo, chest pain, or changes in mood, anxiety, or depression. He has not experienced any significant weight loss or changes in blood pressure since starting Sunosi .  He uses a CPAP machine consistently, averaging eight hours and fifty-four minutes of use per night, with a pressure setting of five to fifteen. His apnea score is 0.3.  No issues with dozing off during activities such as reading, watching TV, sitting in a public place, or as  a passenger in a car. His Epworth Sleepiness Scale score is 3/24 (previously 14) indicating low daytime sleepiness.  Airview download 08/13/23-09/11/23 Usage days 30/30 (100%) > 4 hours Average usage 8 hours 54 mins Pressure 5-15cm h20 Airleaks 0.8L/min (95%) AHI0.3   Observations/Objective:  Appears well without overt respiratory symptoms  Assessment and Plan:  Assessment and Plan Assessment & Plan Obstructive sleep apnea Obstructive sleep apnea is well controlled with auto CPAP. CPAP usage is 100% compliant with an average of 8 hours and 54 minutes per night. The pressure settings are 5 to 15 cm H2O, and the apnea-hypopnea index is 0.3 events per hour, indicating excellent control. - Continue current CPAP therapy with settings of 5 to 15 cm H2O  Hypersomnia Hypersomnia is managed with Sunosi , a wakefulness promoter. He is taking 75 mg of Sunosi  in the morning. The Epworth Sleepiness Scale score is 3 (previously 14) out of 24, indicating low daytime sleepiness. He reports no adverse effects such as palpitations, weight loss, anxiety, chest pain, jitteriness, sleep disorder, tachycardia, headache, nausea, vomiting, allergic reaction, dizziness, or memory problems. Blood pressure and heart rate remain stable. Potential side effects of Sunosi  include palpitations, weight loss, anxiety, chest pain, jitteriness, sleep disorder, tachycardia, headache, nausea, vomiting, allergic reaction, dizziness, memory problems, and increased risk for anxiety and insomnia. - Provide refills for Sunosi  - Monitor blood pressure and heart rate regularly - Schedule follow-up in six months for Sunosi  prescription refill  Essential hypertension Essential hypertension is managed with Diovan . Blood pressure remains stable with no reported changes since starting Sunosi . - Continue Diovan  for hypertension management - Regularly monitor blood pressure, either at home or during visits to primary care    Follow Up  Instructions:   6 months fu  I discussed the assessment and treatment plan with the patient. The patient was provided an opportunity to ask questions and all were answered. The patient agreed with the plan and demonstrated an understanding of the instructions.   The patient was advised to call back or seek an in-person evaluation if the symptoms worsen or if the condition fails to improve as anticipated.  I provided 22 minutes of non-face-to-face time during this encounter.   Almarie LELON Ferrari, NP

## 2023-09-12 NOTE — Patient Instructions (Signed)
  VISIT SUMMARY: Wayne Wise, a 43 year old male with sleep apnea and hypersomnia, came in for a follow-up visit. His sleep apnea is well controlled with consistent CPAP use, and his hypersomnia is managed with Sunosi . He also has stable blood pressure while on Diovan  for hypertension.  YOUR PLAN: -OBSTRUCTIVE SLEEP APNEA: Obstructive sleep apnea is a condition where the airway becomes blocked during sleep, causing breathing pauses. Your sleep apnea is well controlled with your CPAP machine, which you are using consistently. Continue using your CPAP machine with the current settings of 5 to 15 cm H2O.  -HYPERSOMNIA: Hypersomnia is excessive daytime sleepiness. Your hypersomnia is being managed with Sunosi , which helps promote wakefulness. Continue taking Sunosi  75 mg in the morning. Monitor your blood pressure and heart rate regularly. We will schedule a follow-up in six months for your Sunosi  prescription refill.  -ESSENTIAL HYPERTENSION: Essential hypertension is high blood pressure with no identifiable cause. Your blood pressure is stable with Diovan . Continue taking Diovan  and regularly monitor your blood pressure, either at home or during visits to your primary care doctor.  INSTRUCTIONS: Please continue using your CPAP machine as directed and take your medications as prescribed. Monitor your blood pressure and heart rate regularly. Schedule a follow-up appointment in six months for your Sunosi  prescription refill.

## 2023-10-14 ENCOUNTER — Other Ambulatory Visit: Payer: Self-pay | Admitting: Family

## 2023-10-21 ENCOUNTER — Encounter: Payer: Self-pay | Admitting: Family

## 2023-10-21 ENCOUNTER — Telehealth: Payer: Self-pay

## 2023-10-21 ENCOUNTER — Ambulatory Visit: Admitting: Family

## 2023-10-21 VITALS — BP 128/84 | HR 81 | Ht 72.0 in | Wt 295.0 lb

## 2023-10-21 DIAGNOSIS — Z13220 Encounter for screening for lipoid disorders: Secondary | ICD-10-CM | POA: Diagnosis not present

## 2023-10-21 DIAGNOSIS — Z1211 Encounter for screening for malignant neoplasm of colon: Secondary | ICD-10-CM

## 2023-10-21 DIAGNOSIS — Z23 Encounter for immunization: Secondary | ICD-10-CM | POA: Diagnosis not present

## 2023-10-21 DIAGNOSIS — Z0000 Encounter for general adult medical examination without abnormal findings: Secondary | ICD-10-CM

## 2023-10-21 DIAGNOSIS — Z131 Encounter for screening for diabetes mellitus: Secondary | ICD-10-CM | POA: Diagnosis not present

## 2023-10-21 LAB — COMPREHENSIVE METABOLIC PANEL WITH GFR
ALT: 42 U/L (ref 0–53)
AST: 20 U/L (ref 0–37)
Albumin: 4.7 g/dL (ref 3.5–5.2)
Alkaline Phosphatase: 90 U/L (ref 39–117)
BUN: 19 mg/dL (ref 6–23)
CO2: 32 meq/L (ref 19–32)
Calcium: 10.3 mg/dL (ref 8.4–10.5)
Chloride: 98 meq/L (ref 96–112)
Creatinine, Ser: 0.99 mg/dL (ref 0.40–1.50)
GFR: 92.17 mL/min (ref >60.00)
Glucose, Bld: 105 mg/dL — ABNORMAL HIGH (ref 70–99)
Potassium: 4.1 meq/L (ref 3.5–5.1)
Sodium: 140 meq/L (ref 135–145)
Total Bilirubin: 0.5 mg/dL (ref 0.2–1.2)
Total Protein: 7.5 g/dL (ref 6.0–8.3)

## 2023-10-21 LAB — LIPID PANEL
Cholesterol: 202 mg/dL — ABNORMAL HIGH (ref 0–200)
HDL: 45.9 mg/dL (ref >39.00)
LDL Cholesterol: 118 mg/dL — ABNORMAL HIGH (ref 0–99)
NonHDL: 155.68
Total CHOL/HDL Ratio: 4
Triglycerides: 187 mg/dL — ABNORMAL HIGH (ref 0.0–149.0)
VLDL: 37.4 mg/dL (ref 0.0–40.0)

## 2023-10-21 LAB — CBC WITH DIFFERENTIAL/PLATELET
Basophils Absolute: 0.1 K/uL (ref 0.0–0.1)
Basophils Relative: 1.1 % (ref 0.0–3.0)
Eosinophils Absolute: 0.1 K/uL (ref 0.0–0.7)
Eosinophils Relative: 1 % (ref 0.0–5.0)
HCT: 46.5 % (ref 39.0–52.0)
Hemoglobin: 15.4 g/dL (ref 13.0–17.0)
Lymphocytes Relative: 22 % (ref 12.0–46.0)
Lymphs Abs: 1.8 K/uL (ref 0.7–4.0)
MCHC: 33.2 g/dL (ref 30.0–36.0)
MCV: 95.5 fl (ref 78.0–100.0)
Monocytes Absolute: 0.5 K/uL (ref 0.1–1.0)
Monocytes Relative: 6.6 % (ref 3.0–12.0)
Neutro Abs: 5.6 K/uL (ref 1.4–7.7)
Neutrophils Relative %: 69.3 % (ref 43.0–77.0)
Platelets: 262 K/uL (ref 150.0–400.0)
RBC: 4.87 Mil/uL (ref 4.22–5.81)
RDW: 12.8 % (ref 11.5–15.5)
WBC: 8 K/uL (ref 4.0–10.5)

## 2023-10-21 LAB — HEMOGLOBIN A1C: Hgb A1c MFr Bld: 6 % (ref 4.6–6.5)

## 2023-10-21 NOTE — Patient Instructions (Signed)
 Please call [facility] to get set up for a transfer of care appointment- please plan to follow up there in about 3 months;

## 2023-10-21 NOTE — Telephone Encounter (Signed)
 OV scheduled for 03/16/2024 with [name] NP

## 2023-10-21 NOTE — Addendum Note (Signed)
 Addended by: ORVIN HARLENE HERO on: 10/21/2023 11:33 AM   Modules accepted: Orders
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
## 2023-10-21 NOTE — Telephone Encounter (Signed)
 Copied from CRM [id number]. Topic: Appointments - Transfer of Care >> Oct 21, 2023  1:11 PM [name] wrote: Pt is requesting to transfer FROM: Wayne Wise Pt is requesting to transfer TO: [name] Reason for requested transfer: Original provider leaving office It is the responsibility of the team the patient would like to transfer to [facility]) to reach out to the patient if for any reason this transfer is not acceptable.

## 2023-10-22 ENCOUNTER — Ambulatory Visit: Payer: Self-pay | Admitting: Family
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
## 2023-11-05 ENCOUNTER — Other Ambulatory Visit: Payer: Self-pay | Admitting: Internal Medicine

## 2023-11-05 DIAGNOSIS — M109 Gout, unspecified: Secondary | ICD-10-CM

## 2023-11-06 LAB — COLOGUARD: COLOGUARD: NEGATIVE

## 2023-11-06 NOTE — Telephone Encounter (Signed)
 Last Fill: 11/11/2022  Labs: 10/21/2023 Glucose 105  Next Visit: 11/11/2023  Last Visit: 11/11/2022  DX: Gout of foot, unspecified cause, unspecified chronicity, unspecified laterality   Current Dose per office note 11/11/2022:  allopurinol  (ZYLOPRIM ) 300 MG tablet    Okay to refill Allopurinol ?

## 2023-11-11 ENCOUNTER — Encounter: Payer: Self-pay | Admitting: Internal Medicine

## 2023-11-11 ENCOUNTER — Ambulatory Visit: Payer: 59 | Attending: Internal Medicine | Admitting: Internal Medicine

## 2023-11-11 VITALS — BP 143/76 | HR 74 | Temp 97.5°F | Resp 16 | Ht 72.0 in | Wt 296.4 lb

## 2023-11-11 DIAGNOSIS — M722 Plantar fascial fibromatosis: Secondary | ICD-10-CM

## 2023-11-11 DIAGNOSIS — M79609 Pain in unspecified limb: Secondary | ICD-10-CM | POA: Diagnosis not present

## 2023-11-11 DIAGNOSIS — Z5181 Encounter for therapeutic drug level monitoring: Secondary | ICD-10-CM

## 2023-11-11 DIAGNOSIS — M109 Gout, unspecified: Secondary | ICD-10-CM

## 2023-11-11 MED ORDER — INDOMETHACIN 50 MG PO CAPS: 50.0000 mg | ORAL_CAPSULE | Freq: Three times a day (TID) | ORAL | 1 refills | Status: AC | PRN

## 2023-11-11 NOTE — Patient Instructions (Signed)
 I recommend try taking the indomethacin  50 mg for your current increase in joint and muscle stiffness. I suspect there may be some overuse related inflammation. If symptoms are not improved with the medication or get worse over time let us  know.

## 2023-11-30 ENCOUNTER — Emergency Department (HOSPITAL_BASED_OUTPATIENT_CLINIC_OR_DEPARTMENT_OTHER)

## 2023-11-30 ENCOUNTER — Other Ambulatory Visit: Payer: Self-pay

## 2023-11-30 ENCOUNTER — Emergency Department (HOSPITAL_BASED_OUTPATIENT_CLINIC_OR_DEPARTMENT_OTHER)
Admission: EM | Admit: 2023-11-30 | Discharge: 2023-11-30 | Disposition: A | Discharge status: Home / Self Care | Attending: Emergency Medicine | Admitting: Emergency Medicine

## 2023-11-30 ENCOUNTER — Encounter (HOSPITAL_BASED_OUTPATIENT_CLINIC_OR_DEPARTMENT_OTHER): Payer: Self-pay | Admitting: Emergency Medicine

## 2023-11-30 DIAGNOSIS — J40 Bronchitis, not specified as acute or chronic: Secondary | ICD-10-CM | POA: Diagnosis not present

## 2023-11-30 DIAGNOSIS — R918 Other nonspecific abnormal finding of lung field: Secondary | ICD-10-CM

## 2023-11-30 DIAGNOSIS — T782XXA Anaphylactic shock, unspecified, initial encounter: Secondary | ICD-10-CM | POA: Insufficient documentation

## 2023-11-30 DIAGNOSIS — T7840XA Allergy, unspecified, initial encounter: Secondary | ICD-10-CM | POA: Diagnosis present

## 2023-11-30 DIAGNOSIS — R911 Solitary pulmonary nodule: Secondary | ICD-10-CM | POA: Insufficient documentation

## 2023-11-30 LAB — PRO BRAIN NATRIURETIC PEPTIDE: Pro Brain Natriuretic Peptide: <50 pg/mL (ref <300.0)

## 2023-11-30 LAB — CBC WITH DIFFERENTIAL/PLATELET
Abs Immature Granulocytes: 0.1 K/uL — ABNORMAL HIGH (ref 0.00–0.07)
Basophils Absolute: 0 K/uL (ref 0.0–0.1)
Basophils Relative: 0 %
Eosinophils Absolute: 0 K/uL (ref 0.0–0.5)
Eosinophils Relative: 0 %
HCT: 46.6 % (ref 39.0–52.0)
Hemoglobin: 16.3 g/dL (ref 13.0–17.0)
Immature Granulocytes: 1 %
Lymphocytes Relative: 16 %
Lymphs Abs: 1.8 K/uL (ref 0.7–4.0)
MCH: 32.1 pg (ref 26.0–34.0)
MCHC: 35 g/dL (ref 30.0–36.0)
MCV: 91.9 fL (ref 80.0–100.0)
Monocytes Absolute: 0.5 K/uL (ref 0.1–1.0)
Monocytes Relative: 5 %
Neutro Abs: 8.8 K/uL — ABNORMAL HIGH (ref 1.7–7.7)
Neutrophils Relative %: 78 %
Platelets: 246 K/uL (ref 150–400)
RBC: 5.07 MIL/uL (ref 4.22–5.81)
RDW: 12.2 % (ref 11.5–15.5)
Smear Review: NORMAL
WBC: 11.2 K/uL — ABNORMAL HIGH (ref 4.0–10.5)
nRBC: 0 % (ref 0.0–0.2)

## 2023-11-30 LAB — COMPREHENSIVE METABOLIC PANEL WITH GFR
ALT: 57 U/L — ABNORMAL HIGH (ref 0–44)
AST: 36 U/L (ref 15–41)
Albumin: 4.6 g/dL (ref 3.5–5.0)
Alkaline Phosphatase: 103 U/L (ref 38–126)
Anion gap: 16 — ABNORMAL HIGH (ref 5–15)
BUN: 21 mg/dL — ABNORMAL HIGH (ref 6–20)
CO2: 22 mmol/L (ref 22–32)
Calcium: 9.6 mg/dL (ref 8.9–10.3)
Chloride: 99 mmol/L (ref 98–111)
Creatinine, Ser: 0.92 mg/dL (ref 0.61–1.24)
GFR, Estimated: >60 mL/min (ref >60)
Glucose, Bld: 130 mg/dL — ABNORMAL HIGH (ref 70–99)
Potassium: 3.6 mmol/L (ref 3.5–5.1)
Sodium: 137 mmol/L (ref 135–145)
Total Bilirubin: 0.6 mg/dL (ref 0.0–1.2)
Total Protein: 7.6 g/dL (ref 6.5–8.1)

## 2023-11-30 LAB — MAGNESIUM: Magnesium: 2.1 mg/dL (ref 1.7–2.4)

## 2023-11-30 MED ORDER — IOHEXOL 350 MG/ML SOLN
100.0000 mL | Freq: Once | INTRAVENOUS | Status: AC | PRN
Start: 2023-11-30 — End: 2023-11-30
  Administered 2023-11-30: 75 mL via INTRAVENOUS

## 2023-11-30 MED ORDER — EPINEPHRINE 0.3 MG/0.3ML IJ SOAJ
0.3000 mg | Freq: Once | INTRAMUSCULAR | Status: AC
  Administered 2023-11-30: 0.3 mg via INTRAMUSCULAR
  Filled 2023-11-30: qty 0.3

## 2023-11-30 MED ORDER — EPINEPHRINE 0.3 MG/0.3ML IJ SOAJ: INTRAMUSCULAR | 1 refills | Status: AC

## 2023-11-30 MED ORDER — METHYLPREDNISOLONE SODIUM SUCC 125 MG IJ SOLR
125.0000 mg | Freq: Once | INTRAMUSCULAR | Status: DC
  Filled 2023-11-30: qty 2

## 2023-11-30 MED ORDER — DOXYCYCLINE HYCLATE 100 MG PO CAPS: 100.0000 mg | ORAL_CAPSULE | Freq: Two times a day (BID) | ORAL | 0 refills | Status: AC

## 2023-11-30 MED ORDER — LACTATED RINGERS IV BOLUS
1000.0000 mL | Freq: Once | INTRAVENOUS | Status: AC
  Administered 2023-11-30: 1000 mL via INTRAVENOUS

## 2023-11-30 NOTE — ED Notes (Signed)
 Pt alert and oriented X 4 at the time of discharge. RR even and unlabored. No acute distress noted. Pt verbalized understanding of discharge instructions as discussed. Pt ambulatory to lobby at time of discharge.

## 2023-11-30 NOTE — ED Notes (Signed)
 EDP made aware of pt concerns for the use of solu-medrol . Medication is held at bedside at this time per EDP.

## 2023-11-30 NOTE — ED Notes (Signed)
 Pt resting comfortably in bed.  Good PO intake now NVD. IVF have completed.  Pt also notes that resolutions of symptoms almost completely besides the tightness in his hand.  Hives appear well and healing.

## 2023-11-30 NOTE — ED Triage Notes (Signed)
 Pt with hx of alpha gal. States he started having itching around 1900, took Singulair , Zyrtec, and Benadryl around 2000 (and a second dose of Benadryl just PTA). Reports decrease in itching, but reports hands swollen, and generalized hives, nausea, sweating and feeling presyncopal at home around [place].

## 2023-11-30 NOTE — ED Notes (Signed)
 Pt resting in bed with lights out. Wife from bedside noted due to time frame on monitoring she was going to head home to rest for a little while and the pt had he phone to call when he was ready for DC.

## 2023-11-30 NOTE — ED Provider Notes (Signed)
 Blood pressure 118/65, pulse (!) 115, temperature 97.9 F (36.6 C), temperature source Oral, resp. rate 20, height 6' (1.829 m), weight 131.5 kg, SpO2 98%.  Assuming care from Dr. Lorette.  In short, Wayne Wise is a 43 y.o. male with a chief complaint of Allergic Reaction .  Refer to the original H&P for additional details.  The current plan of care is to f/u with CTA PE.  No PE on CT. Plan for d/c with abx for developing PNA/bronchitis.    Darra Fonda MATSU, MD 12/10/23 [phone]

## 2023-11-30 NOTE — ED Provider Notes (Signed)
 [place] [facility]ENC[facility] DEPART[facility] AT [facility] [place] Provider Note   CSN: [id number] Arrival date & time: 11/30/23  [id number]     Patient presents with: Allergic Reaction   Wayne Wise is a 43 y.o. male.  {Add pertinent medical, surgical, social history, OB history to YEP:67052}  known history of alpha-gal allergy who presents with symptoms suggestive of an allergic reaction following a potluck event. The patient reports the onset of symptoms after waking up in the early morning hours, including dizziness, nausea without vomiting, profuse sweating, and generalized body shaking. He also notes swelling, joint soreness, and hives covering his body, with his fingertips turning purple. The patient denies any recent exposure to mammalian products, although acknowledges the possibility of accidental exposure at the event. He has a history of similar reactions but has never experienced anaphylaxis. He carries EpiPens, though they are expired, and has never used them. The patient also reports a persistent dry cough for the past three weeks, initially contracted from his son, for which he was advised it was viral. He denies fever and continues to work despite the cough. His regular medications include Valsartan , Allopurinol , Levocetirizine, and Potassium Citrate. He denies smoking. History was obtained from the patient and his wife.   Allergic Reaction      Prior to Admission medications   Medication Sig Start Date End Date Taking? Authorizing Provider  allopurinol  (ZYLOPRIM ) 300 MG tablet TAKE 1 TABLET BY MOUTH DAILY 11/10/23   Rice, Lonni ORN, MD  EPINEPHrine  (AUVI-Q ) 0.3 mg/0.3 mL IJ SOAJ injection Use as directed for severe allergic reactions 07/09/22   Ambs, Arlean HERO, FNP  EPINEPHrine  0.3 mg/0.3 mL IJ SOAJ injection epinephrine  0.3 mg/0.3 mL injection, auto-injector 07/05/22   Ambs, Arlean HERO, FNP  famotidine  (PEPCID ) 20 MG tablet Take 1 tablet (20 mg total) by mouth 2 (two) times daily. Patient  not taking: Reported on 11/11/2023 07/02/22   Cari Arlean HERO, FNP  indomethacin  (INDOCIN ) 50 MG capsule Take 1 capsule (50 mg total) by mouth 3 (three) times daily as needed. For gout flare 11/11/23   Jeannetta Lonni ORN, MD  levocetirizine (XYZAL ) 5 MG tablet TAKE ONE TABLET BY MOUTH ONE TIME DAILY AS NEEDED FOR [facility] AND HIVES 10/14/23   Sury Wentworth Leita Repine, FNP  meclizine (ANTIVERT) 25 MG tablet Take 25 mg by mouth 2 (two) times daily. Patient not taking: Reported on 11/11/2023 04/13/23   [provider]  Potassium Citrate 15 MEQ (1620 MG) TBCR Take 1 tablet by mouth 2 (two) times daily.    [provider]  scopolamine (TRANSDERM-SCOP) 1 MG/3DAYS Place 1 patch onto the skin every 3 (three) days. Patient not taking: Reported on 11/11/2023 04/13/23   [provider]  Solriamfetol  HCl (SUNOSI ) 75 MG TABS Take 1 tablet (75 mg total) by mouth daily. 09/12/23   Hope Almarie ORN, NP  valsartan -hydrochlorothiazide  (DIOVAN -HCT) 160-25 MG tablet TAKE 1 TABLET BY MOUTH ONE TIME DAILY 10/14/23   Jullie Arps Leita Repine, FNP    Allergies: Other, Balsam, Bismuth subnitrate, Castor oil, Hydrocodone , Lanolin, Poison ivy extract, and Sulfa antibiotics    Review of Systems  Updated Vital Signs BP 122/82   Pulse 93   Resp (!) 22   Ht 6' (1.829 m)   Wt 131.5 kg   SpO2 99%   BMI 39.33 kg/m   Physical Exam Vitals and nursing note reviewed.  Constitutional:      Appearance: He is well-developed.  HENT:     Head: Normocephalic and atraumatic.  Cardiovascular:     Rate and Rhythm: Tachycardia present.  Pulmonary:     Effort: Pulmonary effort is normal. Tachypnea present. No respiratory distress.     Breath sounds: No wheezing.  Abdominal:     General: There is no distension.  Musculoskeletal:        General: Normal range of motion.     Cervical back: Normal range of motion.  Skin:    Comments: Diffuse urticaria  Neurological:     Mental Status: He is alert.     (all labs  ordered are listed, but only abnormal results are displayed) Labs Reviewed  CBC WITH DIFFERENTIAL/PLATELET - Abnormal; Notable for the following components:      Result Value   WBC 11.2 (*)    Neutro Abs 8.8 (*)    Abs Immature Granulocytes 0.10 (*)    All other components within normal limits  COMPREHENSIVE METABOLIC PANEL WITH GFR - Abnormal; Notable for the following components:   Glucose, Bld 130 (*)    BUN 21 (*)    ALT 57 (*)    Anion gap 16 (*)    All other components within normal limits  PRO BRAIN NATRIURETIC PEPTIDE  MAGNESIUM    EKG: None  Radiology: No results found.  {Document cardiac monitor, telemetry assessment procedure when appropriate:32947} .Critical Care  Performed by: Lorette Mayo, MD Authorized by: Lorette Mayo, MD   Critical care provider statement:    Critical care time (minutes):  30   Critical care was necessary to treat or prevent imminent or life-threatening deterioration of the following conditions:  Endocrine crisis   Critical care was time spent personally by me on the following activities:  Development of treatment plan with patient or surrogate, discussions with consultants, evaluation of patient's response to treatment, examination of patient, ordering and review of laboratory studies, ordering and review of radiographic studies, ordering and performing treatments and interventions, pulse oximetry, re-evaluation of patient's condition and review of old charts    Medications Ordered in the ED  methylPREDNISolone  sodium succinate (SOLU-MEDROL ) 125 mg/2 mL injection 125 mg (0 mg Intravenous Hold 11/30/23 0240)  EPINEPHrine  (EPI-PEN) injection 0.3 mg (0.3 mg Intramuscular Given 11/30/23 0240)  lactated ringers  bolus 1,000 mL (1,000 mLs Intravenous New Bag/Given 11/30/23 0443)      {Click here for ABCD2, HEART and other calculators REFRESH Note before signing:1}                              Medical Decision Making Amount and/or Complexity  of Data Reviewed Labs: ordered. Radiology: ordered.  Risk Prescription drug management.  Possibly anaphylaxis. Epi pen given. Patient didn't want steroids. Will obs. Cxr.   Reeval and patient's hives are improved significantly. HR improved. RR improved. Overall feels much better will obs until 0630.  ***  {Document critical care time when appropriate  Document review of labs and clinical decision tools ie CHADS2VASC2, etc  Document your independent review of radiology images and any outside records  Document your discussion with family members, caretakers and with consultants  Document social determinants of health affecting pt's care  Document your decision making why or why not admission, treatments were needed:32947:::1}   Final diagnoses:  None    ED Discharge Orders     None

## 2023-11-30 NOTE — Discharge Instructions (Addendum)
 You were seen in the emerged from today with allergic reaction.  Your symptoms are improved and we have called in your epinephrine  autoinjector to the pharmacy.  I have also called in an antibiotic as it appears on CT you may have developed some bronchitis or very early pneumonia.  You also have multiple pulmonary nodules on your CT scan.  Please discuss this at your primary care doctor appointment in November.  They may decide to perform additional CT imaging to follow these areas and make sure they are not changing.

## 2023-11-30 NOTE — ED Notes (Signed)
 Patient transported to CT

## 2023-12-03 ENCOUNTER — Other Ambulatory Visit: Payer: Self-pay

## 2023-12-03 ENCOUNTER — Encounter: Payer: Self-pay | Admitting: Internal Medicine

## 2023-12-03 ENCOUNTER — Ambulatory Visit: Admitting: Internal Medicine

## 2023-12-03 VITALS — BP 134/86 | HR 113 | Temp 98.7°F | Resp 20 | Wt 288.8 lb

## 2023-12-03 DIAGNOSIS — L508 Other urticaria: Secondary | ICD-10-CM | POA: Diagnosis not present

## 2023-12-03 DIAGNOSIS — R053 Chronic cough: Secondary | ICD-10-CM

## 2023-12-03 DIAGNOSIS — L253 Unspecified contact dermatitis due to other chemical products: Secondary | ICD-10-CM

## 2023-12-03 DIAGNOSIS — Z91018 Allergy to other foods: Secondary | ICD-10-CM

## 2023-12-03 MED ORDER — METHYLPREDNISOLONE ACETATE 80 MG/ML IJ SUSP
80.0000 mg | Freq: Once | INTRAMUSCULAR | Status: AC
  Administered 2023-12-03: 80 mg via INTRAMUSCULAR

## 2023-12-03 MED ORDER — CLOBETASOL PROPIONATE 0.05 % EX CREA: 1.0000 | TOPICAL_CREAM | Freq: Two times a day (BID) | CUTANEOUS | 2 refills | Status: DC | PRN

## 2023-12-03 MED ORDER — PREDNISONE 10 MG PO TABS: ORAL_TABLET | ORAL | 0 refills | Status: DC

## 2023-12-03 NOTE — Progress Notes (Signed)
 FOLLOW UP Date of Service/Encounter:   12/03/2023  Subjective:  Wayne Wise (DOB: 10/03/1980) is a 44 y.o. male who [facility] in re-evaluation of the following: allergic rhinitis, chronic urticaria, and alpha gal allergy-here for acute symptom for acute rash as well as chronic cough History obtained from: chart review and patient.  For Review, LV was on 07/02/22  with Arlean Mutter, FNP seen for routine follow-up. See below for summary of history and diagnostics.  ----------------------------------------------------- Pertinent History/Diagnostics:  Alpha-gal: history of alpha gal allergy with his last alpha gal lab work on 03/26/2018 with alpha gal IgE 1.04 Christmas Eve 2023 he began to eat prime rib and when he did not have any adverse reaction, he continued to eat mammalian meat.   - 07/02/23: alpha gal > 100, total IgE 367, beef 37.9, pork 24.6, lamb 15.5 - 2020 alpha gal 1.04 Urticaria:  - labs: 2020: CU index elevated 10.7, norma thyroid  studies including antibodies, baseline tryptase 3.4 (normal), ERs slightly elevated --------------------------------------------------- Today presents for follow-up. Discussed the use of AI scribe software for clinical note transcription with the patient, who gave verbal consent to proceed.  History of Present Illness Wayne Wise is a 43 year old male with a mammal meat allergy who presents with a severe allergic reaction and contact dermatitis. He is accompanied by his wife, Rosina.  Acute allergic reaction to mammalian meat - Severe allergic reaction after unknowingly consuming bacon at a potluck over the weekend -Initial symptoms included dizziness, nausea, severe sweating.  As well as hives on the feet. - Treated with epinephrine ; declined steroids due to prior adverse effects (does not like the way it makes him feel) - Currently taking Benadryl, which causes insomnia and agitation - Has a prescribed  regimen for allergic reactions (famotidine , Singulair , levocetirizine) but has not been using it consistently - Has not taken levocetirizine since the reaction, relying instead on Benadryl - Has been using Benadryl cream on his feet to help with itching and has applied multiple times  Cutaneous manifestations - Hives and swelling localized to the feet, with significant discomfort and exacerbation when standing or walking - Describes sensation as a 'terrible sunburn'  - Redness present on the buttocks since the reaction - Elevation of feet provides temporary relief of swelling  Chronic urticaria - History of chronic hives - No history of eczema or asthma  Persistent cough and respiratory symptoms - Persistent cough for three weeks, attributed to possible post-viral syndrome - Recent CT scan showed pulmonary nodules, not followed by pulmonary --ER physician recommended discussing with PCP - Treated with doxycycline without improvement in symptoms -No history of asthma     Chart Review: ER visit 11/30/23 for anaphylaxis-known alpha gal allergy and consumed pot luck-woke up with dizziness, nausea/vomiting, sweating and shaking. Hives noted on ER exam. Treated with epinephrine .  Steroids declined at ER. CT scan 11/30/23: 1. Suboptimal opacification of the pulmonary arteries despite repeat bolus/repeat scanning. Within this limitation, there is no evidence for large central pulmonary embolus in the main, lobar, or segmental pulmonary arteries. No definite subsegmental pulmonary embolus evident although assessment may be limited by bolus timing. 2. Clustered tree-in-bud ground-glass nodularity in the right lower lobe is compatible with infectious/inflammatory etiology. 3. Additional scattered bilateral pulmonary nodules measuring up to 7 mm. Non-contrast chest CT at 3-6 months is recommended. If the nodules are stable at time of repeat CT, then future CT at 18-24 months (from today's  scan) is considered optional for low-risk patients, but is recommended for high-risk patients.  All medications reviewed by clinical staff and updated in chart. No new pertinent medical or surgical history except as noted in HPI.  ROS: All others negative except as noted per HPI.   Objective:  BP 134/86   Pulse (!) 113   Temp 98.7 F (37.1 C) (Temporal)   Resp 20   Wt 288 lb 12.8 oz (131 kg)   SpO2 96%   BMI 39.17 kg/m  Body mass index is 39.17 kg/m. Physical Exam: General Appearance:  Alert, cooperative, no distress, appears stated age  Head:  Normocephalic, without obvious abnormality, atraumatic  Eyes:  Conjunctiva clear, EOM's intact  Nose: Nares normal, no rhinorrhea  Throat: Lips, tongue normal; teeth and gums normal, moist mucous membranes  Neck: Supple, symmetrical  Lungs:   clear to auscultation bilaterally, Respirations unlabored, no coughing  Heart:  regular rate and rhythm and no murmur, Appears well perfused  Extremities: No edema  Skin: Severely erythematous and edematous bilateral feet and ankles, some scattered papules  Neurologic: No gross deficits   Labs:  Lab Orders  No laboratory test(s) ordered today    Assessment/Plan   Assessment and Plan Assessment & Plan Suspected contact dermatitis of feet and buttocks Contact dermatitis on feet and buttocks, likely due to Benadryl cream application. Symptoms include swelling, redness, and itching, resembling a chemical burn. The condition is severe, causing significant discomfort and difficulty walking. - Administer steroid injection today-80 mg IM depomedrol - Prescribe oral steroids for a taper-prednisone  40 mg daily x 4 days then 20 mg daily x 4 days then 10 mg daily x 4 days. - Prescribe clobetasol topical steroid cream for application twice daily as needed until rash resolves - Advise discontinuation of Benadryl cream - Recommend keeping feet elevated to reduce swelling - Instruct to wear Crocs or go  barefoot to avoid pressure on feet  Mammalian meat (alpha-gal) allergy Severe allergic reaction to mammalian meat (alpha-gal) confirmed by testing. Recent exposure to bacon at a potluck led to a reaction, including dizziness, sweating, and nausea. Managed with epinephrine  and antihistamines. Avoidance of mammalian meat is crucial. - Continue avoidance of mammalian meat products - Use epinephrine  in case of reaction following accidental expodsure  Chronic urticaria Chronic urticaria with documented episodes. Current episode not related to chronic urticaria but rather contact dermatitis. Usual antihistamine regimen may not be effective for current symptoms. - Continue levocetirizine as needed for chronic urticaria-can take up to 2 tablets twice daily as needed - Use famotidine  1 tablet twice daily as needed and Singulair  daily as needed for chronic hives if needed  Follow-up for CT scan findings and management of cough Recent CT scan showed nodules and signs of bronchitis/early pneumonia. Antibiotics prescribed but not effective (doxycycline) Son with similar cough at home, now resolving after 4 weeks. Systemic steroids may help with cough. - Follow up with primary care for CT scan results and management of lung nodules - Monitor response to systemic steroids for cough improvement - Contact primary care if symptoms persist or worsen  Follow up : yearly or sooner if needed It was a pleasure meeting you in clinic today! Thank you for allowing me to participate in your care.  Rocky Endow, MD [facility]     Other: none  Rocky Endow, MD  [facility] 

## 2023-12-03 NOTE — Patient Instructions (Addendum)
 Suspected contact dermatitis of feet and buttocks Contact dermatitis on feet and buttocks, likely due to Benadryl cream application. Symptoms include swelling, redness, and itching, resembling a chemical burn. The condition is severe, causing significant discomfort and difficulty walking. - Administer steroid injection today-80 mg IM depomedrol - Prescribe oral steroids for a taper-prednisone  40 mg daily x 4 days then 20 mg daily x 4 days then 10 mg daily x 4 days. - Prescribe clobetasol topical steroid cream for application twice daily as needed until rash resolves - Advise discontinuation of Benadryl cream - Recommend keeping feet elevated to reduce swelling - Instruct to wear Crocs or go barefoot to avoid pressure on feet  Mammalian meat (alpha-gal) allergy Severe allergic reaction to mammalian meat (alpha-gal) confirmed by testing. Recent exposure to bacon at a potluck led to a reaction, including dizziness, sweating, and nausea. Managed with epinephrine  and antihistamines. Avoidance of mammalian meat is crucial. - Continue avoidance of mammalian meat products - Use epinephrine  in case of reaction following accidental expodsure  Chronic urticaria Chronic urticaria with documented episodes. Current episode not related to chronic urticaria but rather contact dermatitis. Usual antihistamine regimen may not be effective for current symptoms. - Continue levocetirizine as needed for chronic urticaria-can take up to 2 tablets twice daily as needed - Use famotidine  1 tablet twice daily as needed and Singulair  daily as needed for chronic hives if needed  Follow-up for CT scan findings and management of cough Recent CT scan showed nodules and signs of bronchitis/early pneumonia. Antibiotics prescribed but not effective (doxycycline) Son with similar cough at home, now resolving after 4 weeks. Systemic steroids may help with cough. - Follow up with primary care for CT scan results and management of  lung nodules - Monitor response to systemic steroids for cough improvement - Contact primary care if symptoms persist or worsen  Follow up : yearly or sooner if needed It was a pleasure meeting you in clinic today! Thank you for allowing me to participate in your care.  Rocky Endow, MD [facility]

## 2023-12-16 ENCOUNTER — Ambulatory Visit
Admission: EM | Admit: 2023-12-16 | Discharge: 2023-12-16 | Disposition: A | Discharge status: Home / Self Care | Attending: Family Medicine | Admitting: Family Medicine

## 2023-12-16 ENCOUNTER — Encounter: Payer: Self-pay | Admitting: Emergency Medicine

## 2023-12-16 DIAGNOSIS — R0981 Nasal congestion: Secondary | ICD-10-CM

## 2023-12-16 MED ORDER — CEFDINIR 300 MG PO CAPS: 300.0000 mg | ORAL_CAPSULE | Freq: Two times a day (BID) | ORAL | 0 refills | Status: AC

## 2023-12-16 MED ORDER — METHYLPREDNISOLONE SODIUM SUCC 125 MG IJ SOLR
125.0000 mg | Freq: Once | INTRAMUSCULAR | Status: AC
  Administered 2023-12-16: 125 mg via INTRAMUSCULAR

## 2023-12-16 NOTE — Discharge Instructions (Signed)
 Make sure you are drinking lots of fluids You have received an injection of Solu-Medrol .  According to record, this is what you got last time.  I hope it is helpful And also prescribing cefdinir  2 times a day.  This is also what was given at your last visit.
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
## 2023-12-16 NOTE — ED Triage Notes (Signed)
 Patient c/o possible sinus infection, nasal drainage and congestion.  Non-productive cough and afebrile x 4 days.  Patient has taken Thera-Flu Sinus.

## 2024-01-25 ENCOUNTER — Other Ambulatory Visit (HOSPITAL_BASED_OUTPATIENT_CLINIC_OR_DEPARTMENT_OTHER): Payer: Self-pay | Admitting: Primary Care

## 2024-02-01 ENCOUNTER — Other Ambulatory Visit: Payer: Self-pay | Admitting: Internal Medicine

## 2024-02-01 DIAGNOSIS — M109 Gout, unspecified: Secondary | ICD-10-CM

## 2024-02-02 NOTE — Telephone Encounter (Signed)
 Please schedule patient for a follow up visit. Patient due September 2026. Thanks!  Follow-Up Instructions: Return in about 1 year (around 11/10/2024) for Gout on allopurinol  f/u 42yr.

## 2024-02-02 NOTE — Telephone Encounter (Signed)
 Patient will call back in the Spring to schedule his 1 year follow-up appointment in September 2026.

## 2024-02-02 NOTE — Telephone Encounter (Signed)
 Last Fill: 11/10/2023  Labs: 11/30/2023 WBC: 11.2, Neutro Abs:8.8, Abs Immature Granulocytes : 0.10, glucose,Bld: 130, BUN: 21,ALT:57,Anion gap:16,  Next Visit: Patient due for a follow up September 2026. Message sent to the front to schedule.   Last Visit: 11/11/2023  DX: Gout of foot, unspecified cause, unspecified chronicity, unspecified laterality   Current Dose per office note 11/11/2023: allopurinol  300 mg PO daily   Okay to refill Allopurinol ?

## 2024-02-25 LAB — OPHTHALMOLOGY REPORT-SCANNED

## 2024-03-16 ENCOUNTER — Encounter: Admitting: Nurse Practitioner

## 2024-03-24 ENCOUNTER — Encounter: Admitting: Family Medicine
